# Patient Record
Sex: Female | Born: 1940 | Race: White | Hispanic: No | Marital: Married | Smoking: Never smoker
Health system: Southern US, Community
[De-identification: ages and names within clinical notes are randomized; demographics above are authoritative.]

## PROBLEM LIST (undated history)

## (undated) DIAGNOSIS — H9190 Unspecified hearing loss, unspecified ear: Secondary | ICD-10-CM

## (undated) DIAGNOSIS — I10 Essential (primary) hypertension: Secondary | ICD-10-CM

## (undated) DIAGNOSIS — Z87442 Personal history of urinary calculi: Secondary | ICD-10-CM

## (undated) DIAGNOSIS — E785 Hyperlipidemia, unspecified: Secondary | ICD-10-CM

## (undated) DIAGNOSIS — E039 Hypothyroidism, unspecified: Secondary | ICD-10-CM

## (undated) DIAGNOSIS — M1990 Unspecified osteoarthritis, unspecified site: Secondary | ICD-10-CM

## (undated) DIAGNOSIS — E119 Type 2 diabetes mellitus without complications: Secondary | ICD-10-CM

---

## 2001-03-31 ENCOUNTER — Encounter: Payer: Self-pay | Admitting: Internal Medicine

## 2001-03-31 ENCOUNTER — Ambulatory Visit (HOSPITAL_COMMUNITY): Admission: RE | Admit: 2001-03-31 | Discharge: 2001-03-31 | Payer: Self-pay | Admitting: Internal Medicine

## 2001-04-24 ENCOUNTER — Other Ambulatory Visit: Admission: RE | Admit: 2001-04-24 | Discharge: 2001-04-24 | Payer: Self-pay | Admitting: Internal Medicine

## 2001-04-30 ENCOUNTER — Encounter: Payer: Self-pay | Admitting: Internal Medicine

## 2001-04-30 ENCOUNTER — Ambulatory Visit (HOSPITAL_COMMUNITY): Admission: RE | Admit: 2001-04-30 | Discharge: 2001-04-30 | Payer: Self-pay | Admitting: Internal Medicine

## 2001-11-25 ENCOUNTER — Encounter: Payer: Self-pay | Admitting: Emergency Medicine

## 2001-11-25 ENCOUNTER — Emergency Department (HOSPITAL_COMMUNITY): Admission: EM | Admit: 2001-11-25 | Discharge: 2001-11-25 | Payer: Self-pay | Admitting: Emergency Medicine

## 2002-04-28 ENCOUNTER — Encounter: Payer: Self-pay | Admitting: Internal Medicine

## 2002-04-28 ENCOUNTER — Ambulatory Visit (HOSPITAL_COMMUNITY): Admission: RE | Admit: 2002-04-28 | Discharge: 2002-04-28 | Payer: Self-pay | Admitting: Internal Medicine

## 2003-05-24 ENCOUNTER — Ambulatory Visit (HOSPITAL_COMMUNITY): Admission: RE | Admit: 2003-05-24 | Discharge: 2003-05-24 | Payer: Self-pay | Admitting: Internal Medicine

## 2003-08-02 ENCOUNTER — Ambulatory Visit (HOSPITAL_COMMUNITY): Admission: RE | Admit: 2003-08-02 | Discharge: 2003-08-02 | Payer: Self-pay | Admitting: Internal Medicine

## 2004-06-12 ENCOUNTER — Ambulatory Visit (HOSPITAL_COMMUNITY): Admission: RE | Admit: 2004-06-12 | Discharge: 2004-06-12 | Payer: Self-pay | Admitting: Internal Medicine

## 2005-03-19 HISTORY — PX: COLONOSCOPY: SHX174

## 2005-04-03 ENCOUNTER — Ambulatory Visit: Payer: Self-pay | Admitting: Internal Medicine

## 2005-04-03 ENCOUNTER — Ambulatory Visit (HOSPITAL_COMMUNITY): Admission: RE | Admit: 2005-04-03 | Discharge: 2005-04-03 | Payer: Self-pay | Admitting: Internal Medicine

## 2005-06-19 ENCOUNTER — Ambulatory Visit (HOSPITAL_COMMUNITY): Admission: RE | Admit: 2005-06-19 | Discharge: 2005-06-19 | Payer: Self-pay | Admitting: Family Medicine

## 2006-06-24 ENCOUNTER — Ambulatory Visit (HOSPITAL_COMMUNITY): Admission: RE | Admit: 2006-06-24 | Discharge: 2006-06-24 | Payer: Self-pay | Admitting: Family Medicine

## 2007-07-08 ENCOUNTER — Ambulatory Visit (HOSPITAL_COMMUNITY): Admission: RE | Admit: 2007-07-08 | Discharge: 2007-07-08 | Payer: Self-pay | Admitting: Family Medicine

## 2007-12-04 ENCOUNTER — Ambulatory Visit (HOSPITAL_COMMUNITY): Admission: RE | Admit: 2007-12-04 | Discharge: 2007-12-04 | Payer: Self-pay | Admitting: Ophthalmology

## 2008-01-15 ENCOUNTER — Ambulatory Visit (HOSPITAL_COMMUNITY): Admission: RE | Admit: 2008-01-15 | Discharge: 2008-01-15 | Payer: Self-pay | Admitting: Ophthalmology

## 2008-07-12 ENCOUNTER — Ambulatory Visit (HOSPITAL_COMMUNITY): Admission: RE | Admit: 2008-07-12 | Discharge: 2008-07-12 | Payer: Self-pay | Admitting: Family Medicine

## 2009-07-19 ENCOUNTER — Ambulatory Visit (HOSPITAL_COMMUNITY): Admission: RE | Admit: 2009-07-19 | Discharge: 2009-07-19 | Payer: Self-pay | Admitting: Family Medicine

## 2010-08-04 NOTE — Op Note (Signed)
Lisa Beck, Lisa Beck               ACCOUNT NO.:  [id number]   MEDICAL RECORD NO.:  [id number]          PATIENT TYPE:  AMB   LOCATION:  DAY                           FACILITY:  [facility]   PHYSICIAN:  Lionel December, M.D.    DATE OF BIRTH:  [birth year]   DATE OF PROCEDURE:  04/03/2005  DATE OF DISCHARGE:                                 OPERATIVE REPORT   PROCEDURE:  Colonoscopy.   INDICATIONS:  [name] is a 70 year old Caucasian female who is undergoing  screening colonoscopy. Family history is negative for colorectal carcinoma.  Procedure and risks were reviewed with the patient and informed consent was  obtained.   PREOPERATIVE MEDICATIONS:  Demerol 25 mg IV, Versed 5 mg IV in divided  doses.   FINDINGS:  Procedure performed in endoscopy suite. The patient's vital signs  and O2 saturations were monitored during the procedure and remained stable.  The patient was placed in the left lateral position and rectal examination  performed. No abnormality noted on external or digital exam. Olympus video  scope was placed in the rectum and advanced into sigmoid colon and beyond.  Preparation was satisfactory. Scattered diverticula were noted at sigmoid  colon. Scope was advanced to the cecum which was identified by appendiceal  orifice and ileocecal valve. Pictures taken for the record. As the scope was  withdrawn, colonic mucosa was examined for the second time, and there were  no polyps and/or masses noted. The rectal mucosa similarly was normal. Scope  was retroflexed to examine anorectal junction which was unremarkable. The  scope was straightened and withdrawn. The patient tolerated the procedure  well.   FINAL DIAGNOSIS:  Few scattered diverticula at sigmoid colon. Otherwise  normal colonoscopy.   RECOMMENDATIONS:  1.  Yearly Hemoccults.  2.  High-fiber diet. If she does not take high-fiber diet, she can add fiber      supplement 3 to 4 g per day.      Lionel December, M.D.  Electronically Signed     NR/MEDQ  D:  04/03/2005  T:  04/03/2005  Job:  [id number]   cc:   Melvyn Novas, MD  Fax: [phone]

## 2010-08-10 ENCOUNTER — Other Ambulatory Visit (HOSPITAL_COMMUNITY): Payer: Self-pay | Admitting: Family Medicine

## 2010-08-10 DIAGNOSIS — Z139 Encounter for screening, unspecified: Secondary | ICD-10-CM

## 2010-08-17 ENCOUNTER — Ambulatory Visit (HOSPITAL_COMMUNITY)
Admission: RE | Admit: 2010-08-17 | Discharge: 2010-08-17 | Disposition: A | Discharge status: Home / Self Care | Payer: Medicare Other | Source: Ambulatory Visit | Attending: Family Medicine | Admitting: Family Medicine

## 2010-08-17 DIAGNOSIS — Z139 Encounter for screening, unspecified: Secondary | ICD-10-CM

## 2010-08-17 DIAGNOSIS — Z1231 Encounter for screening mammogram for malignant neoplasm of breast: Secondary | ICD-10-CM | POA: Insufficient documentation

## 2010-12-18 LAB — GLUCOSE, CAPILLARY
Glucose-Capillary: 123 — ABNORMAL HIGH
Glucose-Capillary: 125 — ABNORMAL HIGH

## 2010-12-20 LAB — BASIC METABOLIC PANEL
BUN: 14
CO2: 29
Chloride: 103
Creatinine, Ser: 0.71

## 2010-12-20 LAB — HEMOGLOBIN AND HEMATOCRIT, BLOOD: Hemoglobin: 13.3

## 2011-08-23 ENCOUNTER — Other Ambulatory Visit (HOSPITAL_COMMUNITY): Payer: Self-pay | Admitting: Family Medicine

## 2011-08-23 DIAGNOSIS — Z139 Encounter for screening, unspecified: Secondary | ICD-10-CM

## 2011-08-27 ENCOUNTER — Ambulatory Visit (HOSPITAL_COMMUNITY)
Admission: RE | Admit: 2011-08-27 | Discharge: 2011-08-27 | Disposition: A | Discharge status: Home / Self Care | Payer: Medicare Other | Source: Ambulatory Visit | Attending: Family Medicine | Admitting: Family Medicine

## 2011-08-27 DIAGNOSIS — Z139 Encounter for screening, unspecified: Secondary | ICD-10-CM

## 2011-08-27 DIAGNOSIS — Z1231 Encounter for screening mammogram for malignant neoplasm of breast: Secondary | ICD-10-CM | POA: Insufficient documentation

## 2011-09-03 ENCOUNTER — Other Ambulatory Visit: Payer: Self-pay | Admitting: Family Medicine

## 2011-09-03 DIAGNOSIS — R928 Other abnormal and inconclusive findings on diagnostic imaging of breast: Secondary | ICD-10-CM

## 2011-09-12 ENCOUNTER — Ambulatory Visit (HOSPITAL_COMMUNITY)
Admission: RE | Admit: 2011-09-12 | Discharge: 2011-09-12 | Disposition: A | Discharge status: Home / Self Care | Payer: Medicare Other | Source: Ambulatory Visit | Attending: Family Medicine | Admitting: Family Medicine

## 2011-09-12 DIAGNOSIS — R928 Other abnormal and inconclusive findings on diagnostic imaging of breast: Secondary | ICD-10-CM | POA: Insufficient documentation

## 2012-09-08 ENCOUNTER — Other Ambulatory Visit (HOSPITAL_COMMUNITY): Payer: Self-pay | Admitting: Family Medicine

## 2012-09-08 DIAGNOSIS — Z139 Encounter for screening, unspecified: Secondary | ICD-10-CM

## 2012-09-15 ENCOUNTER — Ambulatory Visit (HOSPITAL_COMMUNITY)
Admission: RE | Admit: 2012-09-15 | Discharge: 2012-09-15 | Disposition: A | Discharge status: Home / Self Care | Payer: Medicare Other | Source: Ambulatory Visit | Attending: Family Medicine | Admitting: Family Medicine

## 2012-09-15 DIAGNOSIS — Z1231 Encounter for screening mammogram for malignant neoplasm of breast: Secondary | ICD-10-CM | POA: Insufficient documentation

## 2012-09-15 DIAGNOSIS — Z139 Encounter for screening, unspecified: Secondary | ICD-10-CM

## 2013-09-25 ENCOUNTER — Other Ambulatory Visit (HOSPITAL_COMMUNITY): Payer: Self-pay | Admitting: Family Medicine

## 2013-09-25 DIAGNOSIS — Z139 Encounter for screening, unspecified: Secondary | ICD-10-CM

## 2013-09-25 DIAGNOSIS — Z1231 Encounter for screening mammogram for malignant neoplasm of breast: Secondary | ICD-10-CM

## 2013-09-30 ENCOUNTER — Ambulatory Visit (HOSPITAL_COMMUNITY)
Admission: RE | Admit: 2013-09-30 | Discharge: 2013-09-30 | Disposition: A | Discharge status: Home / Self Care | Payer: Medicare Other | Source: Ambulatory Visit | Attending: Family Medicine | Admitting: Family Medicine

## 2013-09-30 DIAGNOSIS — Z1231 Encounter for screening mammogram for malignant neoplasm of breast: Secondary | ICD-10-CM

## 2014-06-15 ENCOUNTER — Encounter (HOSPITAL_COMMUNITY): Payer: Self-pay

## 2014-06-15 ENCOUNTER — Inpatient Hospital Stay (HOSPITAL_COMMUNITY)
Admission: EM | Admit: 2014-06-15 | Discharge: 2014-06-17 | DRG: 313 | Disposition: A | Discharge status: Home Health | Payer: Medicare Other | Attending: Family Medicine | Admitting: Family Medicine

## 2014-06-15 ENCOUNTER — Emergency Department (HOSPITAL_COMMUNITY): Payer: Medicare Other

## 2014-06-15 DIAGNOSIS — I10 Essential (primary) hypertension: Secondary | ICD-10-CM | POA: Diagnosis present

## 2014-06-15 DIAGNOSIS — R0789 Other chest pain: Principal | ICD-10-CM | POA: Diagnosis present

## 2014-06-15 DIAGNOSIS — Z7982 Long term (current) use of aspirin: Secondary | ICD-10-CM | POA: Diagnosis not present

## 2014-06-15 DIAGNOSIS — Z794 Long term (current) use of insulin: Secondary | ICD-10-CM | POA: Diagnosis not present

## 2014-06-15 DIAGNOSIS — E785 Hyperlipidemia, unspecified: Secondary | ICD-10-CM | POA: Diagnosis present

## 2014-06-15 DIAGNOSIS — E119 Type 2 diabetes mellitus without complications: Secondary | ICD-10-CM | POA: Diagnosis present

## 2014-06-15 DIAGNOSIS — R079 Chest pain, unspecified: Secondary | ICD-10-CM | POA: Diagnosis present

## 2014-06-15 DIAGNOSIS — E039 Hypothyroidism, unspecified: Secondary | ICD-10-CM | POA: Diagnosis present

## 2014-06-15 HISTORY — DX: Hypothyroidism, unspecified: E03.9

## 2014-06-15 HISTORY — DX: Hyperlipidemia, unspecified: E78.5

## 2014-06-15 HISTORY — DX: Essential (primary) hypertension: I10

## 2014-06-15 HISTORY — DX: Type 2 diabetes mellitus without complications: E11.9

## 2014-06-15 LAB — TROPONIN I

## 2014-06-15 LAB — BASIC METABOLIC PANEL
ANION GAP: 9 (ref 5–15)
BUN: 17 mg/dL (ref 6–23)
CHLORIDE: 102 mmol/L (ref 96–112)
CO2: 23 mmol/L (ref 19–32)
Calcium: 9.9 mg/dL (ref 8.4–10.5)
Creatinine, Ser: 0.88 mg/dL (ref 0.50–1.10)
GFR, EST AFRICAN AMERICAN: 73 mL/min — AB (ref >90)
GFR, EST NON AFRICAN AMERICAN: 63 mL/min — AB (ref >90)
Glucose, Bld: 246 mg/dL — ABNORMAL HIGH (ref 70–99)
POTASSIUM: 3.8 mmol/L (ref 3.5–5.1)
SODIUM: 134 mmol/L — AB (ref 135–145)

## 2014-06-15 LAB — HEPATIC FUNCTION PANEL
ALBUMIN: 4.1 g/dL (ref 3.5–5.2)
ALK PHOS: 90 U/L (ref 39–117)
ALT: 25 U/L (ref 0–35)
AST: 27 U/L (ref 0–37)
Bilirubin, Direct: <0.1 mg/dL (ref 0.0–0.5)
Total Bilirubin: 0.3 mg/dL (ref 0.3–1.2)
Total Protein: 7.2 g/dL (ref 6.0–8.3)

## 2014-06-15 LAB — CBC
HCT: 37.5 % (ref 36.0–46.0)
HEMOGLOBIN: 12.5 g/dL (ref 12.0–15.0)
MCH: 28.9 pg (ref 26.0–34.0)
MCHC: 33.3 g/dL (ref 30.0–36.0)
MCV: 86.6 fL (ref 78.0–100.0)
PLATELETS: 285 10*3/uL (ref 150–400)
RBC: 4.33 MIL/uL (ref 3.87–5.11)
RDW: 13.3 % (ref 11.5–15.5)
WBC: 8.1 10*3/uL (ref 4.0–10.5)

## 2014-06-15 LAB — LIPASE, BLOOD: Lipase: 31 U/L (ref 11–59)

## 2014-06-15 MED ORDER — GI COCKTAIL ~~LOC~~
30.0000 mL | Freq: Once | ORAL | Status: AC
  Administered 2014-06-15: 30 mL via ORAL

## 2014-06-15 MED ORDER — GI COCKTAIL ~~LOC~~
ORAL | Status: AC
  Filled 2014-06-15: qty 30

## 2014-06-15 MED ORDER — NITROGLYCERIN 0.4 MG SL SUBL
SUBLINGUAL_TABLET | SUBLINGUAL | Status: AC
  Administered 2014-06-15: 0.4 mg
  Filled 2014-06-15: qty 1

## 2014-06-15 NOTE — ED Provider Notes (Signed)
CSN: [id number]     Arrival date & time 06/15/14  1940 History   This chart was scribed for Benjiman CoreNathan Zuly Belkin, MD by Evon Slackerrance Branch, ED Scribe. This patient was seen in room [facility] and the patient's care was started at 7:53 PM.     Chief Complaint  Patient presents with  . Chest Pain    Patient is a 74 y.o. female presenting with chest pain. The history is provided by the patient. No language interpreter was used.  Chest Pain Associated symptoms: back pain   Associated symptoms: no shortness of breath    HPI Comments: Lisa Beck is a 74 y.o. female with PMHX of HTN and who presents to the Emergency Department complaining of intermittent central chest pain onset 2 days prior. Pt does report having some back pain. Pt restates that the pain has become more constant today at 4 pm. Pt describes the pain as a pressure on her chest. Pt doesn't report any worsening factors. Pt doesn't report any alleviating factors. Pt doesn't report any medications PTA. Pt denies SOB or other related symptoms.     Past Medical History  Diagnosis Date  . Hypertension   . Diabetes mellitus without complication   . Thyroid disease    History reviewed. No pertinent past surgical history. No family history on file. History  Substance Use Topics  . Smoking status: Never Smoker   . Smokeless tobacco: Never Used  . Alcohol Use: No   OB History    No data available     Review of Systems  Respiratory: Negative for shortness of breath.   Cardiovascular: Positive for chest pain.  Musculoskeletal: Positive for back pain.  All other systems reviewed and are negative.    Allergies  Review of patient's allergies indicates no known allergies.  Home Medications   Prior to Admission medications   Medication Sig Start Date End Date Taking? Authorizing Provider  amLODipine (NORVASC) 5 MG tablet Take 5 mg by mouth daily.   Yes Historical Provider, MD  aspirin EC 81 MG tablet Take 81 mg by mouth every  evening.   Yes Historical Provider, MD  atorvastatin (LIPITOR) 40 MG tablet Take 40 mg by mouth every evening.   Yes Historical Provider, MD  insulin glargine (LANTUS) 100 UNIT/ML injection Inject 32 Units into the skin at bedtime.   Yes Historical Provider, MD  levothyroxine (SYNTHROID, LEVOTHROID) 100 MCG tablet Take 100 mcg by mouth daily before breakfast.   Yes Historical Provider, MD  metFORMIN (GLUCOPHAGE) 500 MG tablet Take 500 mg by mouth 2 (two) times daily.   Yes Historical Provider, MD  metoprolol succinate (TOPROL-XL) 50 MG 24 hr tablet Take 50 mg by mouth daily. Take with or immediately following a meal.   Yes Historical Provider, MD  telmisartan-hydrochlorothiazide (MICARDIS HCT) 40-12.5 MG per tablet Take 1 tablet by mouth daily.   Yes Historical Provider, MD   BP 136/64 mmHg  Pulse 63  Temp(Src) 97.9 F (36.6 C) (Oral)  Resp 18  Ht 5\' 4"  (1.626 m)  Wt 139 lb (63.05 kg)  BMI 23.85 kg/m2  SpO2 100%   Physical Exam  Constitutional: She is oriented to person, place, and time. She appears well-developed and well-nourished. No distress.  HENT:  Head: Normocephalic and atraumatic.  Eyes: Conjunctivae and EOM are normal.  Neck: Neck supple. No tracheal deviation present.  Cardiovascular: Normal rate and regular rhythm.   No murmur heard. Pulmonary/Chest: Effort normal. No respiratory distress.  Abdominal: There is tenderness  in the epigastric area. There is no rebound and no guarding.  Musculoskeletal: Normal range of motion.  Neurological: She is alert and oriented to person, place, and time.  Skin: Skin is warm and dry.  Psychiatric: She has a normal mood and affect. Her behavior is normal.  Nursing note and vitals reviewed.   ED Course  Procedures (including critical care time) Labs Review Labs Reviewed  BASIC METABOLIC PANEL - Abnormal; Notable for the following:    Sodium 134 (*)    Glucose, Bld 246 (*)    GFR calc non Af Amer 63 (*)    GFR calc Af Amer 73 (*)     All other components within normal limits  CBC  TROPONIN I  HEPATIC FUNCTION PANEL  LIPASE, BLOOD  URINALYSIS, ROUTINE W REFLEX MICROSCOPIC  HEMOGLOBIN A1C  COMPREHENSIVE METABOLIC PANEL  CBC WITH DIFFERENTIAL/PLATELET  TSH  TROPONIN I  TROPONIN I  TROPONIN I  BRAIN NATRIURETIC PEPTIDE  LIPID PANEL    Imaging Review Dg Chest Port 1 View  06/15/2014   CLINICAL DATA:  Chest pain, shortness of breath, dizziness starting 4 p.m.  EXAM: PORTABLE CHEST - 1 VIEW  COMPARISON:  None.  FINDINGS: Cardiomediastinal silhouette is unremarkable. No acute infiltrate or pleural effusion. No pulmonary edema. Bony thorax is unremarkable.  IMPRESSION: No active disease.   Electronically Signed   By: Natasha Mead M.D.   On: 06/15/2014 20:18     EKG Interpretation None      Date: 06/15/2014  Rate: 89  Rhythm: normal sinus rhythm  QRS Axis: normal  Intervals: normal  ST/T Wave abnormalities: Nonspecific ST changes with some possible depression and V4 5. No ST elevation.  Conduction Disutrbances: none     MDM   Final diagnoses:  Chest pain, unspecified chest pain type   Patient with chest pain. Does have epigastric tenderness later went to the right side or abdomen. Troponin is negative. Has had pain for last 2 days. Will admit to internal medicine.    I personally performed the services described in this documentation, which was scribed in my presence. The recorded information has been reviewed and is accurate.       Benjiman Core, MD 06/16/14 0030

## 2014-06-15 NOTE — ED Notes (Signed)
Patient states central chest pressure that started today at 1600. Sob and dizziness

## 2014-06-16 ENCOUNTER — Inpatient Hospital Stay (HOSPITAL_COMMUNITY): Payer: Medicare Other

## 2014-06-16 ENCOUNTER — Encounter (HOSPITAL_COMMUNITY): Payer: Self-pay

## 2014-06-16 DIAGNOSIS — R079 Chest pain, unspecified: Secondary | ICD-10-CM | POA: Diagnosis not present

## 2014-06-16 DIAGNOSIS — R0789 Other chest pain: Principal | ICD-10-CM

## 2014-06-16 LAB — CBC WITH DIFFERENTIAL/PLATELET
BASOS ABS: 0.1 10*3/uL (ref 0.0–0.1)
Basophils Relative: 1 % (ref 0–1)
Eosinophils Absolute: 0.1 10*3/uL (ref 0.0–0.7)
Eosinophils Relative: 2 % (ref 0–5)
HCT: 36.3 % (ref 36.0–46.0)
Hemoglobin: 11.8 g/dL — ABNORMAL LOW (ref 12.0–15.0)
Lymphocytes Relative: 39 % (ref 12–46)
Lymphs Abs: 2.3 10*3/uL (ref 0.7–4.0)
MCH: 27.8 pg (ref 26.0–34.0)
MCHC: 32.5 g/dL (ref 30.0–36.0)
MCV: 85.6 fL (ref 78.0–100.0)
MONOS PCT: 7 % (ref 3–12)
Monocytes Absolute: 0.4 10*3/uL (ref 0.1–1.0)
NEUTROS ABS: 3 10*3/uL (ref 1.7–7.7)
Neutrophils Relative %: 51 % (ref 43–77)
Platelets: 251 10*3/uL (ref 150–400)
RBC: 4.24 MIL/uL (ref 3.87–5.11)
RDW: 13.3 % (ref 11.5–15.5)
WBC: 5.9 10*3/uL (ref 4.0–10.5)

## 2014-06-16 LAB — URINALYSIS, ROUTINE W REFLEX MICROSCOPIC
Bilirubin Urine: NEGATIVE
Glucose, UA: 500 mg/dL — AB
Hgb urine dipstick: NEGATIVE
Ketones, ur: NEGATIVE mg/dL
Nitrite: NEGATIVE
Protein, ur: NEGATIVE mg/dL
Specific Gravity, Urine: 1.02 (ref 1.005–1.030)
Urobilinogen, UA: 0.2 mg/dL (ref 0.0–1.0)
pH: 5.5 (ref 5.0–8.0)

## 2014-06-16 LAB — CBG MONITORING, ED: Glucose-Capillary: 106 mg/dL — ABNORMAL HIGH (ref 70–99)

## 2014-06-16 LAB — COMPREHENSIVE METABOLIC PANEL
ALBUMIN: 3.6 g/dL (ref 3.5–5.2)
ALK PHOS: 77 U/L (ref 39–117)
ALT: 21 U/L (ref 0–35)
ANION GAP: 6 (ref 5–15)
AST: 23 U/L (ref 0–37)
BILIRUBIN TOTAL: 0.5 mg/dL (ref 0.3–1.2)
BUN: 12 mg/dL (ref 6–23)
CHLORIDE: 107 mmol/L (ref 96–112)
CO2: 25 mmol/L (ref 19–32)
Calcium: 9.5 mg/dL (ref 8.4–10.5)
Creatinine, Ser: 0.63 mg/dL (ref 0.50–1.10)
GFR calc Af Amer: >90 mL/min (ref >90)
GFR calc non Af Amer: 86 mL/min — ABNORMAL LOW (ref >90)
Glucose, Bld: 89 mg/dL (ref 70–99)
Potassium: 3.7 mmol/L (ref 3.5–5.1)
SODIUM: 138 mmol/L (ref 135–145)
TOTAL PROTEIN: 6.3 g/dL (ref 6.0–8.3)

## 2014-06-16 LAB — URINE MICROSCOPIC-ADD ON

## 2014-06-16 LAB — LIPID PANEL
Cholesterol: 155 mg/dL (ref 0–200)
HDL: 63 mg/dL (ref >39)
LDL Cholesterol: 31 mg/dL (ref 0–99)
Total CHOL/HDL Ratio: 2.5 RATIO
Triglycerides: 304 mg/dL — ABNORMAL HIGH (ref <150)
VLDL: 61 mg/dL — ABNORMAL HIGH (ref 0–40)

## 2014-06-16 LAB — BRAIN NATRIURETIC PEPTIDE: B NATRIURETIC PEPTIDE 5: 21 pg/mL (ref 0.0–100.0)

## 2014-06-16 LAB — TROPONIN I

## 2014-06-16 LAB — TSH: TSH: 1.731 u[IU]/mL (ref 0.350–4.500)

## 2014-06-16 LAB — GLUCOSE, CAPILLARY
GLUCOSE-CAPILLARY: 109 mg/dL — AB (ref 70–99)
Glucose-Capillary: 112 mg/dL — ABNORMAL HIGH (ref 70–99)
Glucose-Capillary: 126 mg/dL — ABNORMAL HIGH (ref 70–99)
Glucose-Capillary: 131 mg/dL — ABNORMAL HIGH (ref 70–99)
Glucose-Capillary: 72 mg/dL (ref 70–99)
Glucose-Capillary: 80 mg/dL (ref 70–99)

## 2014-06-16 MED ORDER — ASPIRIN EC 325 MG PO TBEC
325.0000 mg | DELAYED_RELEASE_TABLET | Freq: Every evening | ORAL | Status: DC
  Administered 2014-06-16: 325 mg via ORAL
  Filled 2014-06-16: qty 1

## 2014-06-16 MED ORDER — TELMISARTAN-HCTZ 40-12.5 MG PO TABS: 1.0000 | ORAL_TABLET | Freq: Every day | ORAL | Status: DC

## 2014-06-16 MED ORDER — INSULIN ASPART 100 UNIT/ML ~~LOC~~ SOLN
0.0000 [IU] | SUBCUTANEOUS | Status: DC
  Administered 2014-06-16: 1 [IU] via SUBCUTANEOUS
  Administered 2014-06-17: 2 [IU] via SUBCUTANEOUS
  Administered 2014-06-17: 1 [IU] via SUBCUTANEOUS

## 2014-06-16 MED ORDER — ONDANSETRON HCL 4 MG/2ML IJ SOLN: 4.0000 mg | Freq: Four times a day (QID) | INTRAMUSCULAR | Status: DC | PRN

## 2014-06-16 MED ORDER — SODIUM CHLORIDE 0.9 % IJ SOLN: 3.0000 mL | INTRAMUSCULAR | Status: DC | PRN

## 2014-06-16 MED ORDER — MORPHINE SULFATE 2 MG/ML IJ SOLN: 1.0000 mg | INTRAMUSCULAR | Status: DC | PRN

## 2014-06-16 MED ORDER — SODIUM CHLORIDE 0.9 % IJ SOLN
3.0000 mL | Freq: Two times a day (BID) | INTRAMUSCULAR | Status: DC
  Administered 2014-06-16 – 2014-06-17 (×3): 3 mL via INTRAVENOUS

## 2014-06-16 MED ORDER — HEPARIN SODIUM (PORCINE) 5000 UNIT/ML IJ SOLN
5000.0000 [IU] | Freq: Three times a day (TID) | INTRAMUSCULAR | Status: DC
  Administered 2014-06-16 – 2014-06-17 (×4): 5000 [IU] via SUBCUTANEOUS
  Filled 2014-06-16 (×3): qty 1

## 2014-06-16 MED ORDER — IRBESARTAN 150 MG PO TABS
150.0000 mg | ORAL_TABLET | Freq: Every day | ORAL | Status: DC
  Administered 2014-06-16 – 2014-06-17 (×2): 150 mg via ORAL
  Filled 2014-06-16 (×2): qty 1

## 2014-06-16 MED ORDER — SODIUM CHLORIDE 0.9 % IV SOLN: 250.0000 mL | INTRAVENOUS | Status: DC | PRN

## 2014-06-16 MED ORDER — LEVOTHYROXINE SODIUM 100 MCG PO TABS
100.0000 ug | ORAL_TABLET | Freq: Every day | ORAL | Status: DC
  Administered 2014-06-17: 100 ug via ORAL
  Filled 2014-06-16 (×2): qty 1

## 2014-06-16 MED ORDER — INSULIN GLARGINE 100 UNIT/ML ~~LOC~~ SOLN
32.0000 [IU] | Freq: Every day | SUBCUTANEOUS | Status: DC
  Administered 2014-06-16 (×2): 32 [IU] via SUBCUTANEOUS
  Filled 2014-06-16 (×2): qty 0.32

## 2014-06-16 MED ORDER — ONDANSETRON HCL 4 MG PO TABS: 4.0000 mg | ORAL_TABLET | Freq: Four times a day (QID) | ORAL | Status: DC | PRN

## 2014-06-16 MED ORDER — METOPROLOL SUCCINATE ER 50 MG PO TB24
50.0000 mg | ORAL_TABLET | Freq: Every day | ORAL | Status: DC
  Administered 2014-06-16 – 2014-06-17 (×2): 50 mg via ORAL
  Filled 2014-06-16 (×2): qty 1

## 2014-06-16 MED ORDER — ATORVASTATIN CALCIUM 40 MG PO TABS
40.0000 mg | ORAL_TABLET | Freq: Every evening | ORAL | Status: DC
  Administered 2014-06-16: 40 mg via ORAL
  Filled 2014-06-16: qty 1

## 2014-06-16 MED ORDER — AMLODIPINE BESYLATE 5 MG PO TABS
5.0000 mg | ORAL_TABLET | Freq: Every day | ORAL | Status: DC
  Administered 2014-06-16 – 2014-06-17 (×2): 5 mg via ORAL
  Filled 2014-06-16 (×2): qty 1

## 2014-06-16 MED ORDER — HYDROCHLOROTHIAZIDE 12.5 MG PO CAPS
12.5000 mg | ORAL_CAPSULE | Freq: Every day | ORAL | Status: DC
  Administered 2014-06-16 – 2014-06-17 (×2): 12.5 mg via ORAL
  Filled 2014-06-16 (×2): qty 1

## 2014-06-16 MED ORDER — NITROGLYCERIN 0.4 MG/HR TD PT24
0.4000 mg | MEDICATED_PATCH | Freq: Every day | TRANSDERMAL | Status: AC
Start: 2014-06-16 — End: 2014-06-17
  Administered 2014-06-16: 0.4 mg via TRANSDERMAL
  Filled 2014-06-16 (×3): qty 1

## 2014-06-16 NOTE — Consult Note (Signed)
Cardiology Consultation Note  Patient ID: Lisa Beck, MRN: [id number], DOB/AGE: 75-Apr-1942 75 y.o. Admit date: 06/15/2014   Date of Consult: 06/16/2014 Primary Physician: Isabella Stalling, MD Primary Cardiologist: New to Dr. Wyline Mood  Chief Complaint: chest pain Reason for Consultation: chest pain  HPI: 74 y/o F with history of HTN, IDDM (diabetic for 2 years per patient), HLD, hypothyroidism, family history of CAD but no personal history of CAD who presented to [facility] with chest pain yesterday with chest pain. She says she has been having this on and off for several days, lasting minutes, in the center of her chest described as a pressure, without any particular pattern. It has not occurred with exertion but seems to come on at random. It goes away spontaneously. Yesterday around 4pm she developed substernal chest pressure while fixing dinner. She took aspirin and got ready to go to the ER to be checked out. She was given a GI cocktail with relief, as well as a subsequent NTG with relief (both around 8pm). Chest pain had been constant until this point, about 4 hours' worth. Pain was not made worse by anything including exertion, palpation or inspiration. She denied any associated dyspnea, palpitations, diaphoresis, nausea, vomiting, and has not had any recent LEE, orthopnea, PND, syncope, bedrest, surgery, travel, melena, hematemesis or BRBPR. She is quite active - "I do everything for myself" - and exercises regularly with walking several miles several times a week. Earlier yesterday, she had walked 2 miles without any chest pain or dyspnea. She thinks she had a remote stress test, details unknown. Father had MI sometime in middle life, age unknown. 3 of 5 brothers have died of MIs between ages of 24s-70s.  SBP a little elevated when she came into the hospital at 160s, but currently back down to normal. Labs show normal BNP, tropnonin neg x1, LDL 31, trig 305, CMET wnl. Hgb 11-12, TSH wnl. CXR NAD. VSS  - not tachycardic, tachypnic or hypoxic. Floor is unable to locate initial EKG - admit H/P states "EKG w/ ? t wave flattening per EDP (formal EKG not present)." 12-lead EKG obtained this AM shows NSR 72bpm with nonspecific changes including TWI III, V3 and nonspecific TW change in avF. No prior to compare to. 2D echo pending.   Past Medical History  Diagnosis Date  . Hypertension   . Diabetes mellitus without complication   . Hypothyroidism   . Hyperlipidemia       Most Recent Cardiac Studies: None   Surgical History: History reviewed. No pertinent past surgical history.   Home Meds: Prior to Admission medications   Medication Sig Start Date End Date Taking? Authorizing Provider  amLODipine (NORVASC) 5 MG tablet Take 5 mg by mouth daily.   Yes Historical Provider, MD  aspirin EC 81 MG tablet Take 81 mg by mouth every evening.   Yes Historical Provider, MD  atorvastatin (LIPITOR) 40 MG tablet Take 40 mg by mouth every evening.   Yes Historical Provider, MD  insulin glargine (LANTUS) 100 UNIT/ML injection Inject 32 Units into the skin at bedtime.   Yes Historical Provider, MD  levothyroxine (SYNTHROID, LEVOTHROID) 100 MCG tablet Take 100 mcg by mouth daily before breakfast.   Yes Historical Provider, MD  metFORMIN (GLUCOPHAGE) 500 MG tablet Take 500 mg by mouth 2 (two) times daily.   Yes Historical Provider, MD  metoprolol succinate (TOPROL-XL) 50 MG 24 hr tablet Take 50 mg by mouth daily. Take with or immediately following a meal.  Yes Historical Provider, MD  telmisartan-hydrochlorothiazide (MICARDIS HCT) 40-12.5 MG per tablet Take 1 tablet by mouth daily.   Yes Historical Provider, MD    Inpatient Medications:  . amLODipine  5 mg Oral Daily  . aspirin EC  325 mg Oral QPM  . atorvastatin  40 mg Oral QPM  . heparin  5,000 Units Subcutaneous 3 times per day  . irbesartan  150 mg Oral Daily   And  . hydrochlorothiazide  12.5 mg Oral Daily  . insulin aspart  0-9 Units Subcutaneous 6  times per day  . insulin glargine  32 Units Subcutaneous QHS  . levothyroxine  100 mcg Oral QAC breakfast  . metoprolol succinate  50 mg Oral Daily  . nitroGLYCERIN  0.4 mg Transdermal Daily      Allergies: No Known Allergies  History   Social History  . Marital Status: Married    Spouse Name: N/A  . Number of Children: N/A  . Years of Education: N/A   Occupational History  . Not on file.   Social History Main Topics  . Smoking status: Never Smoker   . Smokeless tobacco: Never Used  . Alcohol Use: No  . Drug Use: No  . Sexual Activity: Not on file   Other Topics Concern  . Not on file   Social History Narrative     Family History  Problem Relation Age of Onset  . CAD Father     MI sometime in middle-life, age unknown  . CAD Brother     Pt has 3 brothers that died of MIs between ages 87s-70s  . CAD Brother   . CAD Brother      Review of Systems: All other systems reviewed and are otherwise negative except as noted above.  Labs:  Recent Labs  06/15/14 1950 06/16/14 0023 06/16/14 0717  TROPONINI <0.03 <0.03 <0.03   Lab Results  Component Value Date   WBC 5.9 06/16/2014   HGB 11.8* 06/16/2014   HCT 36.3 06/16/2014   MCV 85.6 06/16/2014   PLT 251 06/16/2014    Recent Labs Lab 06/16/14 0716  NA 138  K 3.7  CL 107  CO2 25  BUN 12  CREATININE 0.63  CALCIUM 9.5  PROT 6.3  BILITOT 0.5  ALKPHOS 77  ALT 21  AST 23  GLUCOSE 89   Lab Results  Component Value Date   CHOL 155 06/16/2014   HDL 63 06/16/2014   LDLCALC 31 06/16/2014   TRIG 304* 06/16/2014   Radiology/Studies:  Dg Chest Port 1 View  06/15/2014   CLINICAL DATA:  Chest pain, shortness of breath, dizziness starting 4 p.m.  EXAM: PORTABLE CHEST - 1 VIEW  COMPARISON:  None.  FINDINGS: Cardiomediastinal silhouette is unremarkable. No acute infiltrate or pleural effusion. No pulmonary edema. Bony thorax is unremarkable.  IMPRESSION: No active disease.   Electronically Signed   By: Natasha Mead M.D.   On: 06/15/2014 20:18    Wt Readings from Last 3 Encounters:  06/16/14 139 lb (63.05 kg)    EKG: NSR 72bpm with nonspecific changes including TWI III, V3 and nonspecific TW change in avF. No prior to compare to.   Physical Exam: Blood pressure 102/54, pulse 60, temperature 97.9 F (36.6 C), temperature source Oral, resp. rate 16, height  (1.626 m), weight 139 lb (63.05 kg), SpO2 97 %. General: Well developed, well nourished WF, in no acute distress. Head: Normocephalic, atraumatic, sclera non-icteric, no xanthomas, nares are without discharge.  Neck: Negative for carotid bruits. JVD not elevated. Lungs: Clear bilaterally to auscultation without wheezes, rales, or rhonchi. Breathing is unlabored. Heart: RRR with S1 S2. No murmurs, rubs, or gallops appreciated. Abdomen: Soft, non-tender, non-distended with normoactive bowel sounds. No hepatomegaly. No rebound/guarding. No obvious abdominal masses. Msk:  Strength and tone appear normal for age. Extremities: No clubbing or cyanosis. No edema.  Distal pedal pulses are 2+ and equal bilaterally. Neuro: Alert and oriented X 3. No facial asymmetry. No focal deficit. Moves all extremities spontaneously. Psych:  Responds to questions appropriately with a normal affect.    Assessment and Plan:   1. Prolonged chest pain with negative cardiac markers but positive cardiac risk factors 2. Hypertension, controlled 3. Hyperlipidemia, LDL controlled 4. Insulin-dependent diabetes mellitus 5. Family history of CAD  Chest pain is somewhat atypical. It occured randomly and was not worse with exertion. Her troponins are negative despite 3 hours of constant discomfort yesterday. She had walked 2 miles earlier that day without any limitation or symptoms. Discomfort was relieved with GI cocktail initially, then a SL NTG was given which also gave relief. She does have multiple cardiac risk factors including HTN, HLD, DM, and family history of CAD.  EKG with nonspecific changes of unknown chronicity. Will discuss plan with Dr. Wyline MoodBranch but anticipate stress test may be appropriate.  Signed, Ronie Spiesayna Dunn PA-C 06/16/2014, 10:11 AM   Patient seen and discussed with PA Dunn, I agree with her documentation. 74 yo female hx of HTN, DM2, HL admitted with chest pain. Episode yesterday evening while cooking. 5/10 pressure midchest with no other symptoms, no positional. Last a few hours, from notes improved in ER but unclear if due to GI cocktail or NG   BNP 21, TSH 1,2235m K 3.8, Cr 0.88, Hgb 12.5, Plt 285, trop neg x 3, LDL 31 CXR no acute process EKG SR, non-specific diffuse TWIs Echo pending  Chest pain symptoms are mixed for possible cardiac cause. She has multiple CAD risk factors. Will f/u echo, likely plan for exercise cardiolite tomorrow AM  Dominga FerryJ Elliemae Braman MD

## 2014-06-16 NOTE — Progress Notes (Signed)
2D echo reviewed. EF 65-70%, no RWMA. Will proceed with ETT-nuc in AM. Will d/c NTG paste in AM. NPO after midnight. Have written nurse care order to give 1/2 dose insulin tonight given NPO after midnight. Dayna Dunn PA-C

## 2014-06-16 NOTE — Progress Notes (Signed)
UR chart review completed.  

## 2014-06-16 NOTE — Progress Notes (Signed)
Appreciate cardiology note and expertise is with several risk factors and with atypical chest pain and no subsequent recurrence gallbladder ultrasound reveals no evidence of stones wall thickening or common bile duct dilatation patient appears anxious. Lisa Beck ZOX:[id number] DOB: 1940/04/04 DOA: 06/15/2014 PCP: Isabella Stalling, MD             Physical Exam: Blood pressure 142/66, pulse 62, temperature 97.9 F (36.6 C), temperature source Oral, resp. rate 16, height  (1.626 m), weight 139 lb (63.05 kg), SpO2 97 %. neck no JVD no carotid bruits no thyromegaly lungs clear to A&P no rales wheeze rhonchi heart regular rhythm no S3 or S4 no heaves rubs abdomen no right upper quadrant tenderness to guarding or rebound masses no megaly   Investigations:  No results found for this or any previous visit (from the past 240 hour(s)).   Basic Metabolic Panel:  Recent Labs  81/19/14 1950 06/16/14 0716  NA 134* 138  K 3.8 3.7  CL 102 107  CO2 23 25  GLUCOSE 246* 89  BUN 17 12  CREATININE 0.88 0.63  CALCIUM 9.9 9.5   Liver Function Tests:  Recent Labs  06/15/14 1950 06/16/14 0716  AST 27 23  ALT 25 21  ALKPHOS 90 77  BILITOT 0.3 0.5  PROT 7.2 6.3  ALBUMIN 4.1 3.6     CBC:  Recent Labs  06/15/14 1950 06/16/14 0716  WBC 8.1 5.9  NEUTROABS  --  3.0  HGB 12.5 11.8*  HCT 37.5 36.3  MCV 86.6 85.6  PLT 285 251    Dg Chest Port 1 View  06/15/2014   CLINICAL DATA:  Chest pain, shortness of breath, dizziness starting 4 p.m.  EXAM: PORTABLE CHEST - 1 VIEW  COMPARISON:  None.  FINDINGS: Cardiomediastinal silhouette is unremarkable. No acute infiltrate or pleural effusion. No pulmonary edema. Bony thorax is unremarkable.  IMPRESSION: No active disease.   Electronically Signed   By: Natasha Mead M.D.   On: 06/15/2014 20:18   US Abdomen Limited Ruq  06/16/2014   CLINICAL DATA:  One day history of lower chest pain  EXAM: US ABDOMEN LIMITED - RIGHT UPPER QUADRANT   COMPARISON:  CT abdomen and pelvis November 25, 2001  FINDINGS: Gallbladder:  No gallstones or wall thickening visualized. There is no pericholecystic fluid. No sonographic Murphy sign noted.  Common bile duct:  Diameter: 3 mm. There is no intrahepatic or extrahepatic biliary duct dilatation.  Liver:  No focal lesion identified. Liver echogenicity is mildly increased overall.  IMPRESSION: Increased liver echogenicity, a finding that is most likely due to hepatic steatosis. While no focal liver lesions are identified, it must be cautioned that the sensitivity of ultrasound for focal liver lesions is diminished in this circumstance. Study otherwise unremarkable.   Electronically Signed   By: Bretta Bang III M.D.   On: 06/16/2014 11:48      Medications:   Impression: Hypertension Hyper Lipidemia diabetes  Active Problems:   Chest pain     Plan: Observe patient possible functional evaluation prior to or after discharge per cardiology  Consultants: Cardiology    Procedures   Antibiotics:                   Code Status: Full   Family Communication:  Spoke with husband and patient at bedside  Disposition Plan possible functional violation prior to or after discharge determined by cardiology. We'll obtain hemoglobin A1c  Time spent: 30 minutes   LOS:  1 day   [name] M   06/16/2014, 1:39 PM

## 2014-06-16 NOTE — Progress Notes (Signed)
  Echocardiogram 2D Echocardiogram has been performed.  Stacey DrainWhite, Jentri Aye J 06/16/2014, 11:08 AM

## 2014-06-16 NOTE — H&P (Addendum)
Hospitalist Admission History and Physical  Patient name: Lisa Beck Medical record number: [id number] Date of birth: 01/18/1941 Age: 74 y.o. Gender: female  Primary Care Provider: Isabella Stalling, MD  Chief Complaint: chest pain   History of Present Illness:This is a 74 y.o. year old female with significant past medical history of HTN, IDDM, HLD presenting with chest pain. Pt reports progressive onset of central chest pain and pressure over last 24 hours. No radiation pain 4-5 at its worst. Also w/ intermittent chest pressure and fatigue over the last week. Denies any prior hx/o cardiac disease in the past. Chest pain/pressure peaked earlier today.  Presented to ER afebrile, hemodynamically stable. CBC and CMET grossly WNL. Trop neg x1. CXR WNL. EKG w/ ? t wave flattening per EDP (formal EKG not present). Bedside RUQ preliminarily negative for GB disease.   HEART Score 5  Assessment and Plan: Lisa Beck is a 74 y.o. year old female presenting with chest pain    Active Problems:   Chest pain   1- Chest Pain  -fairly typical sxs in pt w/ multiple CV RFs  -pain improved w/ NTG -trop neg x1 -? t wave flattening on EKG per EDP-case discussed w/ cards at [facility] per EDP-ok to stay  -full dose ASA -nitropaste -cycle CEs -abd u/s in am formally r/o GB disease as source of sxs -2D ECHO  -risk stratification labs  -tele bed -follow  -formal cards c/s in am   2-IDDM -SSI -A1C  3-HTN -BP stable  -cont home regimen   FEN/GI: heart healthy-carb modified diet  Prophylaxis: sub q hepari  Disposition: pending further evaluation  Code Status:Full Code    Patient Active Problem List   Diagnosis Date Noted  . Chest pain 06/15/2014   Past Medical History: Past Medical History  Diagnosis Date  . Hypertension   . Diabetes mellitus without complication   . Thyroid disease     Past Surgical History: History reviewed. No pertinent past surgical history.  Social  History: History   Social History  . Marital Status: Married    Spouse Name: N/A  . Number of Children: N/A  . Years of Education: N/A   Social History Main Topics  . Smoking status: Never Smoker   . Smokeless tobacco: Never Used  . Alcohol Use: No  . Drug Use: No  . Sexual Activity: Not on file   Other Topics Concern  . None   Social History Narrative  . None    Family History: No family history on file.  Allergies: No Known Allergies  Current Facility-Administered Medications  Medication Dose Route Frequency Provider Last Rate Last Dose  . amLODipine (NORVASC) tablet 5 mg  5 mg Oral Daily Floydene Flock, MD      . aspirin EC tablet 325 mg  325 mg Oral QPM Floydene Flock, MD      . atorvastatin (LIPITOR) tablet 40 mg  40 mg Oral QPM Floydene Flock, MD      . heparin injection 5,000 Units  5,000 Units Subcutaneous 3 times per day Floydene Flock, MD      . insulin aspart (novoLOG) injection 0-9 Units  0-9 Units Subcutaneous 6 times per day Floydene Flock, MD      . insulin glargine (LANTUS) injection 32 Units  32 Units Subcutaneous QHS Floydene Flock, MD      . levothyroxine (SYNTHROID, LEVOTHROID) tablet 100 mcg  100 mcg Oral QAC breakfast Floydene Flock, MD      .  metoprolol succinate (TOPROL-XL) 24 hr tablet 50 mg  50 mg Oral Daily Floydene FlockSteven J Nykolas Bacallao, MD      . morphine 2 MG/ML injection 1-2 mg  1-2 mg Intravenous Q3H PRN Floydene FlockSteven J Darina Hartwell, MD      . nitroGLYCERIN (NITRODUR - Dosed in mg/24 hr) patch 0.4 mg  0.4 mg Transdermal Daily Floydene FlockSteven J Theora Vankirk, MD      . ondansetron [facility](ZOFRAN) tablet 4 mg  4 mg Oral Q6H PRN Floydene FlockSteven J Jemila Camille, MD       Or  . ondansetron [facility](ZOFRAN) injection 4 mg  4 mg Intravenous Q6H PRN Floydene FlockSteven J Tamsen Reist, MD      . telmisartan-hydrochlorothiazide (MICARDIS HCT) 40-12.5 MG per tablet 1 tablet  1 tablet Oral Daily Floydene FlockSteven J Ladye Macnaughton, MD       Current Outpatient Prescriptions  Medication Sig Dispense Refill  . amLODipine (NORVASC) 5 MG tablet Take 5 mg by  mouth daily.    Marland Kitchen. aspirin EC 81 MG tablet Take 81 mg by mouth every evening.    Marland Kitchen. atorvastatin (LIPITOR) 40 MG tablet Take 40 mg by mouth every evening.    . insulin glargine (LANTUS) 100 UNIT/ML injection Inject 32 Units into the skin at bedtime.    Marland Kitchen. levothyroxine (SYNTHROID, LEVOTHROID) 100 MCG tablet Take 100 mcg by mouth daily before breakfast.    . metFORMIN (GLUCOPHAGE) 500 MG tablet Take 500 mg by mouth 2 (two) times daily.    . metoprolol succinate (TOPROL-XL) 50 MG 24 hr tablet Take 50 mg by mouth daily. Take with or immediately following a meal.    . telmisartan-hydrochlorothiazide (MICARDIS HCT) 40-12.5 MG per tablet Take 1 tablet by mouth daily.     Review Of Systems: 12 point ROS negative except as noted above in HPI.  Physical Exam: Filed Vitals:   06/15/14 2314  BP: 136/64  Pulse: 63  Temp: 97.9 F (36.6 C)  Resp: 18    General: alert and cooperative HEENT: PERRLA and extra ocular movement intact Heart: S1, S2 normal, no murmur, rub or gallop, regular rate and rhythm Lungs: clear to auscultation, no wheezes or rales and unlabored breathing Abdomen: abdomen is soft without significant tenderness, masses, organomegaly or guarding Extremities: extremities normal, atraumatic, no cyanosis or edema Skin:no rashes Neurology: normal without focal findings  Labs and Imaging: Lab Results  Component Value Date/Time   NA 134* 06/15/2014 07:50 PM   K 3.8 06/15/2014 07:50 PM   CL 102 06/15/2014 07:50 PM   CO2 23 06/15/2014 07:50 PM   BUN 17 06/15/2014 07:50 PM   CREATININE 0.88 06/15/2014 07:50 PM   GLUCOSE 246* 06/15/2014 07:50 PM   Lab Results  Component Value Date   WBC 8.1 06/15/2014   HGB 12.5 06/15/2014   HCT 37.5 06/15/2014   MCV 86.6 06/15/2014   PLT 285 06/15/2014    Dg Chest Port 1 View  06/15/2014   CLINICAL DATA:  Chest pain, shortness of breath, dizziness starting 4 p.m.  EXAM: PORTABLE CHEST - 1 VIEW  COMPARISON:  None.  FINDINGS: Cardiomediastinal  silhouette is unremarkable. No acute infiltrate or pleural effusion. No pulmonary edema. Bony thorax is unremarkable.  IMPRESSION: No active disease.   Electronically Signed   By: Natasha MeadLiviu  Pop M.D.   On: 06/15/2014 20:18           Doree AlbeeSteven Mignon Bechler MD  Pager: [phone]

## 2014-06-16 NOTE — Progress Notes (Signed)
Patient/Family oriented to room. Information packet given to patient/family. Admission inpatient armband information verified with patient/family to include name and date of birth and placed on patient arm. Side rails up x 2, fall assessment and education completed with patient/family. Call light within reach. Patient/family able to voice and demonstrate understanding of unit orientation instructions  

## 2014-06-16 NOTE — Care Management Note (Addendum)
    Page 1 of 1   06/17/2014     1:34:12 PM CARE MANAGEMENT NOTE 06/17/2014  Patient:  Lisa Beck,Lisa Beck   Account Number:  [id number]  Date Initiated:  06/16/2014  Documentation initiated by:  Sharrie RothmanBLACKWELL,Willie Loy C  Subjective/Objective Assessment:   Pt admitted from home with CP and abd pain. Pt lives with her husband and will return home at discharge. Pt is independent with ADL's.     Action/Plan:   No CM needs noted. Anticipate discharge within 24 hours.   Anticipated DC Date:  06/17/2014   Anticipated DC Plan:  HOME/SELF CARE      DC Planning Services  CM consult      Choice offered to / List presented to:             Status of service:  Completed, signed off Medicare Important Message given?   (If response is "NO", the following Medicare IM given date fields will be blank) Date Medicare IM given:   Medicare IM given by:   Date Additional Medicare IM given:   Additional Medicare IM given by:    Discharge Disposition:  HOME/SELF CARE  Per UR Regulation:    If discussed at Long Length of Stay Meetings, dates discussed:    Comments:  06/17/14 1330 Arlyss Queenammy Jennice Renegar, RN BSN CM Pt discharged home today. No CM needs noted.  06/16/14 1130 Arlyss Queenammy Wes Lezotte, RN BSN CM

## 2014-06-17 ENCOUNTER — Inpatient Hospital Stay (HOSPITAL_COMMUNITY): Payer: Medicare Other

## 2014-06-17 ENCOUNTER — Encounter (HOSPITAL_COMMUNITY): Payer: Self-pay

## 2014-06-17 DIAGNOSIS — R079 Chest pain, unspecified: Secondary | ICD-10-CM

## 2014-06-17 LAB — HEMOGLOBIN A1C
Hgb A1c MFr Bld: 7.9 % — ABNORMAL HIGH (ref 4.8–5.6)
Hgb A1c MFr Bld: 7.8 % — ABNORMAL HIGH (ref 4.8–5.6)
MEAN PLASMA GLUCOSE: 180 mg/dL
Mean Plasma Glucose: 177 mg/dL

## 2014-06-17 LAB — COMPREHENSIVE METABOLIC PANEL
ALBUMIN: 3.8 g/dL (ref 3.5–5.2)
ALT: 21 U/L (ref 0–35)
AST: 20 U/L (ref 0–37)
Alkaline Phosphatase: 79 U/L (ref 39–117)
Anion gap: 6 (ref 5–15)
BUN: 14 mg/dL (ref 6–23)
CHLORIDE: 104 mmol/L (ref 96–112)
CO2: 26 mmol/L (ref 19–32)
CREATININE: 0.7 mg/dL (ref 0.50–1.10)
Calcium: 9.5 mg/dL (ref 8.4–10.5)
GFR calc Af Amer: >90 mL/min (ref >90)
GFR calc non Af Amer: 83 mL/min — ABNORMAL LOW (ref >90)
GLUCOSE: 102 mg/dL — AB (ref 70–99)
POTASSIUM: 3.8 mmol/L (ref 3.5–5.1)
SODIUM: 136 mmol/L (ref 135–145)
Total Bilirubin: 1 mg/dL (ref 0.3–1.2)
Total Protein: 6.4 g/dL (ref 6.0–8.3)

## 2014-06-17 LAB — CBC WITH DIFFERENTIAL/PLATELET
BASOS ABS: 0 10*3/uL (ref 0.0–0.1)
Basophils Relative: 0 % (ref 0–1)
EOS ABS: 0.1 10*3/uL (ref 0.0–0.7)
EOS PCT: 1 % (ref 0–5)
HCT: 35.7 % — ABNORMAL LOW (ref 36.0–46.0)
Hemoglobin: 11.9 g/dL — ABNORMAL LOW (ref 12.0–15.0)
LYMPHS ABS: 1.4 10*3/uL (ref 0.7–4.0)
Lymphocytes Relative: 13 % (ref 12–46)
MCH: 28.7 pg (ref 26.0–34.0)
MCHC: 33.3 g/dL (ref 30.0–36.0)
MCV: 86 fL (ref 78.0–100.0)
Monocytes Absolute: 0.9 10*3/uL (ref 0.1–1.0)
Monocytes Relative: 9 % (ref 3–12)
NEUTROS PCT: 77 % (ref 43–77)
Neutro Abs: 8.2 10*3/uL — ABNORMAL HIGH (ref 1.7–7.7)
PLATELETS: 239 10*3/uL (ref 150–400)
RBC: 4.15 MIL/uL (ref 3.87–5.11)
RDW: 13.5 % (ref 11.5–15.5)
WBC: 10.6 10*3/uL — AB (ref 4.0–10.5)

## 2014-06-17 LAB — GLUCOSE, CAPILLARY
Glucose-Capillary: 74 mg/dL (ref 70–99)
Glucose-Capillary: 95 mg/dL (ref 70–99)
Glucose-Capillary: 171 mg/dL — ABNORMAL HIGH (ref 70–99)

## 2014-06-17 MED ORDER — TECHNETIUM TC 99M SESTAMIBI - CARDIOLITE
10.0000 | Freq: Once | INTRAVENOUS | Status: AC | PRN
  Administered 2014-06-17: 07:00:00 10 via INTRAVENOUS

## 2014-06-17 MED ORDER — TECHNETIUM TC 99M SESTAMIBI GENERIC - CARDIOLITE
30.0000 | Freq: Once | INTRAVENOUS | Status: AC | PRN
  Administered 2014-06-17: 30 via INTRAVENOUS

## 2014-06-17 MED ORDER — SODIUM CHLORIDE 0.9 % IJ SOLN
INTRAMUSCULAR | Status: AC
  Administered 2014-06-17: 10 mL via INTRAVENOUS
  Filled 2014-06-17: qty 3

## 2014-06-17 MED ORDER — SODIUM CHLORIDE 0.9 % IJ SOLN
10.0000 mL | INTRAMUSCULAR | Status: DC | PRN
  Administered 2014-06-17: 10 mL via INTRAVENOUS
  Filled 2014-06-17: qty 10

## 2014-06-17 MED ORDER — REGADENOSON 0.4 MG/5ML IV SOLN
INTRAVENOUS | Status: AC
  Filled 2014-06-17: qty 5

## 2014-06-17 NOTE — Progress Notes (Signed)
NURSING PROGRESS NOTE  Unk PintoCarolyn K Beck [id number] Discharge Data: 06/17/2014 3:12 PM Attending Provider: No att. providers found WUJ:[id number],GNFAOZHPCP:DONDIEGO,RICHARD Judie PetitM, MD   [name]    All IV's discontinued and monitored for bleeding.  All belongings returned to patient for patient to take home.  AVS summary reviewed with patient.  Patient left floor ambulatory, escorted by RN.  Last Documented Vital Signs:  Blood pressure 134/73, pulse 73, temperature 98.7 F (37.1 C), temperature source Oral, resp. rate 18, height 5\' 4"  (1.626 m), weight 63.05 kg (139 lb), SpO2 100 %.  Lisa Beck, Lisa Beck

## 2014-06-17 NOTE — Discharge Summary (Signed)
Physician Discharge Summary  Lisa Beck UJW:[id number] DOB: March 29, 1940 DOA: 06/15/2014  PCP: Lisa Stalling, MD  Admit date: 06/15/2014 Discharge date: 06/17/2014   Recommendations for Outpatient Follow-up:  Follow-up in my office within one week's time to assess resolution of symptoms racemic control and hemodynamic stability Discharge Diagnoses:  Active Problems:   Chest pain   Discharge Condition: Lisa Beck Weights   06/15/14 1947 06/16/14 0154  Weight: 139 lb (63.05 kg) 139 lb (63.05 kg)    History of present illness:  Recent has insulin dependent diabetes long-standing well-controlled. Hyperlipidemia well-controlled hypertension well-controlled was admitted with some atypical/chest pain with some typical components cardiac enzymes were negative serially seen in consultation by cardiology who ended up doing a nuclear perfusion scan which revealed no evidence of reversible ischemia and normal contractility wall myocardial segments  Hospital Course:  Due to no objective evidence of ischemia patient was discharged on her regular medicines prior to admission follow-up in my office within a week's time she is cautioned to take aspirin 81 mg per day she is exceedingly compliant  Procedures:    Consultations:  Cardiology  Discharge Instructions  Discharge Instructions    Discharge instructions    Complete by:  As directed      Discharge patient    Complete by:  As directed             Medication List    TAKE these medications        amLODipine 5 MG tablet  Commonly known as:  NORVASC  Take 5 mg by mouth daily.     aspirin EC 81 MG tablet  Take 81 mg by mouth every evening.     atorvastatin 40 MG tablet  Commonly known as:  LIPITOR  Take 40 mg by mouth every evening.     insulin glargine 100 UNIT/ML injection  Commonly known as:  LANTUS  Inject 32 Units into the skin at bedtime.     levothyroxine 100 MCG tablet  Commonly known as:  SYNTHROID,  LEVOTHROID  Take 100 mcg by mouth daily before breakfast.     metFORMIN 500 MG tablet  Commonly known as:  GLUCOPHAGE  Take 500 mg by mouth 2 (two) times daily.     metoprolol succinate 50 MG 24 hr tablet  Commonly known as:  TOPROL-XL  Take 50 mg by mouth daily. Take with or immediately following a meal.     telmisartan-hydrochlorothiazide 40-12.5 MG per tablet  Commonly known as:  MICARDIS HCT  Take 1 tablet by mouth daily.       No Known Allergies    The results of significant diagnostics from this hospitalization (including imaging, microbiology, ancillary and laboratory) are listed below for reference.    Significant Diagnostic Studies: Nm Myocar Multi W/spect W/wall Motion / Ef  06/17/2014   CLINICAL DATA:  74 year old female with no known history of coronary artery disease referred for chest pain.  EXAM: MYOCARDIAL IMAGING WITH SPECT (REST AND EXERCISE)  GATED LEFT VENTRICULAR WALL MOTION STUDY  LEFT VENTRICULAR EJECTION FRACTION  TECHNIQUE: Standard myocardial SPECT imaging was performed after resting intravenous injection of 10 mCi Tc-27m sestamibi. Subsequently, exercise tolerance test was performed by the patient under the supervision of the Cardiology staff. At peak-stress, 30 mCi Tc-95m sestamibi was injected intravenously and standard myocardial SPECT imaging was performed. Quantitative gated imaging was also performed to evaluate left ventricular wall motion, and estimate left ventricular ejection fraction.  COMPARISON:  None.  FINDINGS: Exercise  stress  The patient was stressed according to the Bruce protocol for 7 min and 30 seconds achieving a work level of 10.10 Mets. The resting heart rate is 78 beats per min rose to a maximal rate of 129 beats per min, representing 88% of the maximal age predicted heart rate. Resting blood pressure of 124/67 increased to 148/53. The test was stopped due to fatigue, the patient did not experience any chest pain. Baseline EKG shows sinus  rhythm. Stress EKG shows nonspecific ST/T changes in the inferior leads. There are no specific ischemic changes and no significant arrhythmias.  Perfusion: There is a medium size mild intensity inferior wall defect seen in the resting images. A similar but less intense defect is seen in the post stress images. The inferior wall has normal wall motion. Findings are consistent with sub- diaphragmatic attenuation. There are no other perfusion defects.  Wall Motion: Normal left ventricular wall motion. No left ventricular dilation.  Left Ventricular Ejection Fraction: 76 %  End diastolic volume 41 ml  End systolic volume 10 ml  IMPRESSION: 1. No reversible ischemia or infarction.  2. Normal left ventricular wall motion.  3. Left ventricular ejection fraction 76%  4. Low-risk stress test findings*. Duke treadmill score of 8 consistent with low risk for major cardiac events. Excellent exercise functional capacity (>150% of predicted based on age and gender)  *2012 Appropriate Use Criteria for Coronary Revascularization Focused Update: J Am Coll Cardiol. 2012;59(9):857-881. http://content.dementiazones.comonlinejacc.org/article.aspx?articleid=1201161   Electronically Signed   By: Dina RichJonathan  Branch   On: 06/17/2014 11:29   Dg Chest Port 1 View  06/15/2014   CLINICAL DATA:  Chest pain, shortness of breath, dizziness starting 4 p.m.  EXAM: PORTABLE CHEST - 1 VIEW  COMPARISON:  None.  FINDINGS: Cardiomediastinal silhouette is unremarkable. No acute infiltrate or pleural effusion. No pulmonary edema. Bony thorax is unremarkable.  IMPRESSION: No active disease.   Electronically Signed   By: Natasha MeadLiviu  Pop M.D.   On: 06/15/2014 20:18   [place] Abdomen Limited Ruq  06/16/2014   CLINICAL DATA:  One day history of lower chest pain  EXAM: US ABDOMEN LIMITED - RIGHT UPPER QUADRANT  COMPARISON:  CT abdomen and pelvis November 25, 2001  FINDINGS: Gallbladder:  No gallstones or wall thickening visualized. There is no pericholecystic fluid. No sonographic  Murphy sign noted.  Common bile duct:  Diameter: 3 mm. There is no intrahepatic or extrahepatic biliary duct dilatation.  Liver:  No focal lesion identified. Liver echogenicity is mildly increased overall.  IMPRESSION: Increased liver echogenicity, a finding that is most likely due to hepatic steatosis. While no focal liver lesions are identified, it must be cautioned that the sensitivity of ultrasound for focal liver lesions is diminished in this circumstance. Study otherwise unremarkable.   Electronically Signed   By: Bretta BangWilliam  Woodruff III M.D.   On: 06/16/2014 11:48    Microbiology: No results found for this or any previous visit (from the past 240 hour(s)).   Labs: Basic Metabolic Panel:  Recent Labs Lab 06/15/14 1950 06/16/14 0716 06/17/14 0651  NA 134* 138 136  K 3.8 3.7 3.8  CL 102 107 104  CO2 23 25 26   GLUCOSE 246* 89 102*  BUN 17 12 14   CREATININE 0.88 0.63 0.70  CALCIUM 9.9 9.5 9.5   Liver Function Tests:  Recent Labs Lab 06/15/14 1950 06/16/14 0716 06/17/14 0651  AST 27 23 20   ALT 25 21 21   ALKPHOS 90 77 79  BILITOT 0.3 0.5 1.0  PROT 7.2 6.3 6.4  ALBUMIN 4.1 3.6 3.8    Recent Labs Lab 06/15/14 1950  LIPASE 31   No results for input(s): AMMONIA in the last 168 hours. CBC:  Recent Labs Lab 06/15/14 1950 06/16/14 0716 06/17/14 0651  WBC 8.1 5.9 10.6*  NEUTROABS  --  3.0 8.2*  HGB 12.5 11.8* 11.9*  HCT 37.5 36.3 35.7*  MCV 86.6 85.6 86.0  PLT 285 251 239   Cardiac Enzymes:  Recent Labs Lab 06/15/14 1950 06/16/14 0023 06/16/14 0717 06/16/14 1159  TROPONINI <0.03 <0.03 <0.03 <0.03   BNP: BNP (last 3 results)  Recent Labs  06/15/14 1950  BNP 21.0    ProBNP (last 3 results) No results for input(s): PROBNP in the last 8760 hours.  CBG:  Recent Labs Lab 06/16/14 2039 06/16/14 2333 06/17/14 0358 06/17/14 0721 06/17/14 1130  GLUCAP 131* 126* 74 95 171*       Signed:  Karrah Mangini M  [facility] Pager:  [phone] 06/17/2014, 12:29 PM

## 2014-06-17 NOTE — Progress Notes (Signed)
Patient ID: Unk Pintoarolyn K Pujol, female   DOB: 06/01/1940, 74 y.o.   MRN: [id number]    Subjective:    No symptoms overnight   Objective:   Temp:  [98.1 F (36.7 C)-98.7 F (37.1 C)] 98.7 F (37.1 C) (03/31 0400) Pulse Rate:  [62-71] 71 (03/31 0400) Resp:  [18] 18 (03/31 0400) BP: (106-142)/(57-66) 108/61 mmHg (03/31 0400) SpO2:  [99 %-100 %] 100 % (03/31 0400) Last BM Date: 06/16/14  Filed Weights   06/15/14 1947 06/16/14 0154  Weight: 139 lb (63.05 kg) 139 lb (63.05 kg)    Intake/Output Summary (Last 24 hours) at 06/17/14 0915 Last data filed at 06/16/14 1200  Gross per 24 hour  Intake    360 ml  Output      0 ml  Net    360 ml    Telemetry: NSR  Exam:  General: NAD  Resp: CTAB  Cardiac: RRR, no m/r/g, no JVD  GI: abdomen soft, NT,ND  MSK:no LE edema  Neuro: no focal deficits   Lab Results:  Basic Metabolic Panel:  Recent Labs Lab 06/15/14 1950 06/16/14 0716 06/17/14 0651  NA 134* 138 136  K 3.8 3.7 3.8  CL 102 107 104  CO2 23 25 26   GLUCOSE 246* 89 102*  BUN 17 12 14   CREATININE 0.88 0.63 0.70  CALCIUM 9.9 9.5 9.5    Liver Function Tests:  Recent Labs Lab 06/15/14 1950 06/16/14 0716 06/17/14 0651  AST 27 23 20   ALT 25 21 21   ALKPHOS 90 77 79  BILITOT 0.3 0.5 1.0  PROT 7.2 6.3 6.4  ALBUMIN 4.1 3.6 3.8    CBC:  Recent Labs Lab 06/15/14 1950 06/16/14 0716 06/17/14 0651  WBC 8.1 5.9 10.6*  HGB 12.5 11.8* 11.9*  HCT 37.5 36.3 35.7*  MCV 86.6 85.6 86.0  PLT 285 251 239    Cardiac Enzymes:  Recent Labs Lab 06/16/14 0023 06/16/14 0717 06/16/14 1159  TROPONINI <0.03 <0.03 <0.03    BNP: No results for input(s): PROBNP in the last 8760 hours.  Coagulation: No results for input(s): INR in the last 168 hours.  ECG:   Medications:   Scheduled Medications: . amLODipine  5 mg Oral Daily  . aspirin EC  325 mg Oral QPM  . atorvastatin  40 mg Oral QPM  . heparin  5,000 Units Subcutaneous 3 times per day  . irbesartan   150 mg Oral Daily   And  . hydrochlorothiazide  12.5 mg Oral Daily  . insulin aspart  0-9 Units Subcutaneous 6 times per day  . insulin glargine  32 Units Subcutaneous QHS  . levothyroxine  100 mcg Oral QAC breakfast  . metoprolol succinate  50 mg Oral Daily  . nitroGLYCERIN  0.4 mg Transdermal Daily  . regadenoson      . sodium chloride  3 mL Intravenous Q12H  . sodium chloride         Infusions:     PRN Medications:  sodium chloride, morphine injection, ondansetron **OR** ondansetron (ZOFRAN) IV, sodium chloride, technetium sestamibi generic     Assessment/Plan    1. Chest pain - negative cardiac workup including EKGs and cardiac enzymes. Normal echo, stress test without any evidence of ischemia. - appears to be noncardiac chest pain. No further workup planned, ok for discharge from our standpoint.   No cardiology f/u needed     Dina RichJonathan Branch, M.D.

## 2014-06-17 NOTE — Discharge Instructions (Signed)
Chest Pain Observation  It is often hard to give a specific diagnosis for the cause of chest pain. Among other possibilities your symptoms might be caused by inadequate oxygen delivery to your heart (angina). Angina that is not treated or evaluated can lead to a heart attack (myocardial infarction) or death.  Blood tests, electrocardiograms, and X-rays may have been done to help determine a possible cause of your chest pain. After evaluation and observation, your health care provider has determined that it is unlikely your pain was caused by an unstable condition that requires hospitalization. However, a full evaluation of your pain may need to be completed, with additional diagnostic testing as directed. It is very important to keep your follow-up appointments. Not keeping your follow-up appointments could result in permanent heart damage, disability, or death. If there is any problem keeping your follow-up appointments, you must call your health care provider.  HOME CARE INSTRUCTIONS   Due to the slight chance that your pain could be angina, it is important to follow your health care provider's treatment plan and also maintain a healthy lifestyle:  · Maintain or work toward achieving a healthy weight.  · Stay physically active and exercise regularly.  · Decrease your salt intake.  · Eat a balanced, healthy diet. Talk to a dietitian to learn about heart-healthy foods.  · Increase your fiber intake by including whole grains, vegetables, fruits, and nuts in your diet.  · Avoid situations that cause stress, anger, or depression.  · Take medicines as advised by your health care provider. Report any side effects to your health care provider. Do not stop medicines or adjust the dosages on your own.  · Quit smoking. Do not use nicotine patches or gum until you check with your health care provider.  · Keep your blood pressure, blood sugar, and cholesterol levels within normal limits.  · Limit alcohol intake to no more than  1 drink per day for women who are not pregnant and 2 drinks per day for men.  · Do not abuse drugs.  SEEK IMMEDIATE MEDICAL CARE IF:  You have severe chest pain or pressure which may include symptoms such as:  · You feel pain or pressure in your arms, neck, jaw, or back.  · You have severe back or abdominal pain, feel sick to your stomach (nauseous), or throw up (vomit).  · You are sweating profusely.  · You are having a fast or irregular heartbeat.  · You feel short of breath while at rest.  · You notice increasing shortness of breath during rest, sleep, or with activity.  · You have chest pain that does not get better after rest or after taking your usual medicine.  · You wake from sleep with chest pain.  · You are unable to sleep because you cannot breathe.  · You develop a frequent cough or you are coughing up blood.  · You feel dizzy, faint, or experience extreme fatigue.  · You develop severe weakness, dizziness, fainting, or chills.  Any of these symptoms may represent a serious problem that is an emergency. Do not wait to see if the symptoms will go away. Call your local emergency services (911 in the [place] Do not drive yourself to the hospital.  MAKE SURE YOU:  · Understand these instructions.  · Will watch your condition.  · Will get help right away if you are not doing well or get worse.  Document Released: 04/07/2010 Document Revised: 03/10/2013 Document Reviewed: 09/04/2012    Exit[employer]® Patient Information ©2015 [employer]. This information is not intended to replace advice given to you by your health care provider. Make sure you discuss any questions you have with your health care provider.

## 2014-06-17 NOTE — Progress Notes (Signed)
Stress Lab Nurses Notes - [facility]  Unk PintoCarolyn K Limb 06/17/2014 Reason for doing test: Chest Pain Type of test: Stress Cardiolite/Inpatient Rm 334 Nurse performing test: Parke PoissonPhyllis Billingsly, RN Nuclear Medicine Tech: Lou CalLeslie Walker Echo Tech: Not Applicable MD performing test: Branch/K.Lyman BishopLawrence NP Family MD: [facility] PLL[facility]go Test explained and consent signed: Yes.   IV started: Saline lock flushed, No redness or edema and Saline lock from floor Symptoms: Fatigue in legs Treatment/Intervention: None Reason test stopped: fatigue After recovery IV was: No redness or edema and Saline Lock flushed Patient to return to Nuc. Med at : 10:00 Patient discharged: Transported back to room 334 via wc Patient's Condition upon discharge was: stable Comments: During test peak BP 148/53 & HR 129 .  Recovery BP 134/67 & HR 83 . Symptoms resolved in recovery. Erskine SpeedBillingsley, Tiamarie Furnari T

## 2014-09-24 ENCOUNTER — Other Ambulatory Visit (HOSPITAL_COMMUNITY): Payer: Self-pay | Admitting: Family Medicine

## 2014-09-24 DIAGNOSIS — Z1231 Encounter for screening mammogram for malignant neoplasm of breast: Secondary | ICD-10-CM

## 2014-10-11 ENCOUNTER — Ambulatory Visit (HOSPITAL_COMMUNITY)
Admission: RE | Admit: 2014-10-11 | Discharge: 2014-10-11 | Disposition: A | Discharge status: Home / Self Care | Payer: Medicare Other | Source: Ambulatory Visit | Attending: Family Medicine | Admitting: Family Medicine

## 2014-10-11 DIAGNOSIS — Z1231 Encounter for screening mammogram for malignant neoplasm of breast: Secondary | ICD-10-CM | POA: Insufficient documentation

## 2014-11-02 ENCOUNTER — Other Ambulatory Visit (HOSPITAL_COMMUNITY): Payer: Self-pay | Admitting: Family Medicine

## 2014-11-02 ENCOUNTER — Ambulatory Visit (HOSPITAL_COMMUNITY)
Admission: RE | Admit: 2014-11-02 | Discharge: 2014-11-02 | Disposition: A | Discharge status: Home / Self Care | Payer: Medicare Other | Source: Ambulatory Visit | Attending: Family Medicine | Admitting: Family Medicine

## 2014-11-02 DIAGNOSIS — M25562 Pain in left knee: Secondary | ICD-10-CM | POA: Diagnosis present

## 2014-11-02 DIAGNOSIS — M158 Other polyosteoarthritis: Secondary | ICD-10-CM

## 2015-11-16 ENCOUNTER — Other Ambulatory Visit (HOSPITAL_COMMUNITY): Payer: Self-pay | Admitting: Family Medicine

## 2015-11-16 DIAGNOSIS — Z1231 Encounter for screening mammogram for malignant neoplasm of breast: Secondary | ICD-10-CM

## 2015-11-28 ENCOUNTER — Ambulatory Visit (HOSPITAL_COMMUNITY)
Admission: RE | Admit: 2015-11-28 | Discharge: 2015-11-28 | Disposition: A | Discharge status: Home / Self Care | Payer: Medicare Other | Source: Ambulatory Visit | Attending: Family Medicine | Admitting: Family Medicine

## 2015-11-28 DIAGNOSIS — Z1231 Encounter for screening mammogram for malignant neoplasm of breast: Secondary | ICD-10-CM | POA: Diagnosis present

## 2016-11-02 ENCOUNTER — Other Ambulatory Visit (HOSPITAL_COMMUNITY): Payer: Self-pay | Admitting: Family Medicine

## 2016-11-02 DIAGNOSIS — Z1231 Encounter for screening mammogram for malignant neoplasm of breast: Secondary | ICD-10-CM

## 2016-11-28 ENCOUNTER — Ambulatory Visit (HOSPITAL_COMMUNITY)
Admission: RE | Admit: 2016-11-28 | Discharge: 2016-11-28 | Disposition: A | Discharge status: Home / Self Care | Payer: Medicare Other | Source: Ambulatory Visit | Attending: Family Medicine | Admitting: Family Medicine

## 2016-11-28 DIAGNOSIS — Z1231 Encounter for screening mammogram for malignant neoplasm of breast: Secondary | ICD-10-CM | POA: Diagnosis not present

## 2017-10-20 ENCOUNTER — Emergency Department (HOSPITAL_COMMUNITY): Payer: Medicare Other

## 2017-10-20 ENCOUNTER — Emergency Department (HOSPITAL_COMMUNITY)
Admission: EM | Admit: 2017-10-20 | Discharge: 2017-10-21 | Disposition: A | Discharge status: Home / Self Care | Payer: Medicare Other | Attending: Emergency Medicine | Admitting: Emergency Medicine

## 2017-10-20 ENCOUNTER — Other Ambulatory Visit: Payer: Self-pay

## 2017-10-20 ENCOUNTER — Encounter (HOSPITAL_COMMUNITY): Payer: Self-pay

## 2017-10-20 DIAGNOSIS — I10 Essential (primary) hypertension: Secondary | ICD-10-CM | POA: Diagnosis not present

## 2017-10-20 DIAGNOSIS — Z79899 Other long term (current) drug therapy: Secondary | ICD-10-CM | POA: Insufficient documentation

## 2017-10-20 DIAGNOSIS — R079 Chest pain, unspecified: Secondary | ICD-10-CM

## 2017-10-20 DIAGNOSIS — R0789 Other chest pain: Secondary | ICD-10-CM | POA: Diagnosis not present

## 2017-10-20 DIAGNOSIS — E119 Type 2 diabetes mellitus without complications: Secondary | ICD-10-CM | POA: Insufficient documentation

## 2017-10-20 DIAGNOSIS — Z794 Long term (current) use of insulin: Secondary | ICD-10-CM | POA: Diagnosis not present

## 2017-10-20 DIAGNOSIS — R112 Nausea with vomiting, unspecified: Secondary | ICD-10-CM | POA: Diagnosis not present

## 2017-10-20 DIAGNOSIS — Z7982 Long term (current) use of aspirin: Secondary | ICD-10-CM | POA: Diagnosis not present

## 2017-10-20 DIAGNOSIS — E039 Hypothyroidism, unspecified: Secondary | ICD-10-CM | POA: Diagnosis not present

## 2017-10-20 LAB — CBC
HCT: 30.9 % — ABNORMAL LOW (ref 36.0–46.0)
Hemoglobin: 9.5 g/dL — ABNORMAL LOW (ref 12.0–15.0)
MCH: 24.5 pg — AB (ref 26.0–34.0)
MCHC: 30.7 g/dL (ref 30.0–36.0)
MCV: 79.6 fL (ref 78.0–100.0)
Platelets: 328 10*3/uL (ref 150–400)
RBC: 3.88 MIL/uL (ref 3.87–5.11)
RDW: 15.3 % (ref 11.5–15.5)
WBC: 8.6 10*3/uL (ref 4.0–10.5)

## 2017-10-20 LAB — HEPATIC FUNCTION PANEL
ALT: 22 U/L (ref 0–44)
AST: 30 U/L (ref 15–41)
Albumin: 3.7 g/dL (ref 3.5–5.0)
Alkaline Phosphatase: 80 U/L (ref 38–126)
BILIRUBIN TOTAL: 0.6 mg/dL (ref 0.3–1.2)
Bilirubin, Direct: <0.1 mg/dL (ref 0.0–0.2)
Total Protein: 6.5 g/dL (ref 6.5–8.1)

## 2017-10-20 LAB — BASIC METABOLIC PANEL
Anion gap: 5 (ref 5–15)
BUN: 19 mg/dL (ref 8–23)
CALCIUM: 9.7 mg/dL (ref 8.9–10.3)
CHLORIDE: 104 mmol/L (ref 98–111)
CO2: 24 mmol/L (ref 22–32)
Creatinine, Ser: 0.9 mg/dL (ref 0.44–1.00)
GFR calc non Af Amer: 60 mL/min — ABNORMAL LOW (ref >60)
GLUCOSE: 185 mg/dL — AB (ref 70–99)
Potassium: 3.9 mmol/L (ref 3.5–5.1)
SODIUM: 133 mmol/L — AB (ref 135–145)

## 2017-10-20 LAB — I-STAT TROPONIN, ED: TROPONIN I, POC: 0 ng/mL (ref 0.00–0.08)

## 2017-10-20 LAB — LIPASE, BLOOD: Lipase: 51 U/L (ref 11–51)

## 2017-10-20 LAB — TROPONIN I: Troponin I: <0.03 ng/mL (ref <0.03)

## 2017-10-20 MED ORDER — SODIUM CHLORIDE 0.9 % IV BOLUS
1000.0000 mL | Freq: Once | INTRAVENOUS | Status: AC
  Administered 2017-10-20: 1000 mL via INTRAVENOUS

## 2017-10-20 MED ORDER — ALUM & MAG HYDROXIDE-SIMETH 200-200-20 MG/5ML PO SUSP
30.0000 mL | Freq: Once | ORAL | Status: AC
  Administered 2017-10-21: 30 mL via ORAL
  Filled 2017-10-20: qty 30

## 2017-10-20 MED ORDER — ONDANSETRON HCL 4 MG/2ML IJ SOLN
4.0000 mg | Freq: Once | INTRAMUSCULAR | Status: AC
  Administered 2017-10-20: 4 mg via INTRAVENOUS
  Filled 2017-10-20: qty 2

## 2017-10-20 NOTE — Discharge Instructions (Signed)
You were evaluated in the emergency department for chest pain and vomiting that occurred after you would not eat dinner.  We did an EKG chest x-ray and blood work did not show an obvious cause of your symptoms.  He will be important for you to follow-up with your doctor and your cardiologist.  Return if any concerning symptoms.

## 2017-10-20 NOTE — ED Notes (Signed)
Pt ambulated without difficulty

## 2017-10-20 NOTE — ED Triage Notes (Signed)
Chest pain started at 830 pm. 10/10 until took ntg at home. Pain went away. vomited at home.  VSS. Seen by cardiology last week and given ntg then. 324 Asprin given by ems 20 left ac

## 2017-10-20 NOTE — ED Provider Notes (Signed)
[facility] EMERGENCY DEPARTMENT Provider Note   CSN: [id number] Arrival date & time: 10/20/17  2140     History   Chief Complaint Chief Complaint  Patient presents with  . Chest Pain    HPI Lisa Beck is a 77 y.o. female.  77 year old female complaining of chest pain nausea vomiting.  It sounds like she went with her family to [facility] Tuesday's earlier and when she got home she started experiencing substernal chest pain 10 out of 10.  She took a nitro and then felt acutely nauseous and vomited.  She was diaphoretic at that time.  Her chest pain has resolved and she is only feeling a little bit nauseous now.  She said she is gotten chest pain here and there in the past and had a work-up about 18 months ago where they did not find anything and gave her some nitro to use as needed.  She received aspirin by EMS.  She denies any recent illness.  The history is provided by the patient, the spouse and a relative.  Chest Pain   This is a new problem. The current episode started less than 1 hour ago. The problem has been resolved. The pain is present in the substernal region. The pain is at a severity of 10/10. The quality of the pain is described as pressure-like. The pain does not radiate. Associated symptoms include diaphoresis, nausea and vomiting. Pertinent negatives include no abdominal pain, no back pain, no cough, no fever, no hemoptysis, no near-syncope, no numbness, no palpitations and no shortness of breath. She has tried nitroglycerin for the symptoms. The treatment provided significant relief. Risk factors include being elderly.  Her past medical history is significant for diabetes, hyperlipidemia and hypertension.  Pertinent negatives for past medical history include no seizures.    Past Medical History:  Diagnosis Date  . Diabetes mellitus without complication (HCC)   . Hyperlipidemia   . Hypertension   . Hypothyroidism     Patient Active Problem List   Diagnosis Date Noted   . Chest pain 06/15/2014    History reviewed. No pertinent surgical history.   OB History   None      Home Medications    Prior to Admission medications   Medication Sig Start Date End Date Taking? Authorizing Provider  amLODipine (NORVASC) 5 MG tablet Take 5 mg by mouth daily.    [provider]  aspirin EC 81 MG tablet Take 81 mg by mouth every evening.    [provider]  atorvastatin (LIPITOR) 40 MG tablet Take 40 mg by mouth every evening.    [provider]  insulin glargine (LANTUS) 100 UNIT/ML injection Inject 32 Units into the skin at bedtime.    [provider]  levothyroxine (SYNTHROID, LEVOTHROID) 100 MCG tablet Take 100 mcg by mouth daily before breakfast.    [provider]  metFORMIN (GLUCOPHAGE) 500 MG tablet Take 500 mg by mouth 2 (two) times daily.    [provider]  metoprolol succinate (TOPROL-XL) 50 MG 24 hr tablet Take 50 mg by mouth daily. Take with or immediately following a meal.    [provider]  telmisartan-hydrochlorothiazide (MICARDIS HCT) 40-12.5 MG per tablet Take 1 tablet by mouth daily.    [provider]    Family History Family History  Problem Relation Age of Onset  . CAD Father        MI sometime in middle-life, age unknown  . CAD Brother  Pt has 3 brothers that died of MIs between ages 1260s-70s  . CAD Brother   . CAD Brother     Social History Social History   Tobacco Use  . Smoking status: Never Smoker  . Smokeless tobacco: Never Used  Substance Use Topics  . Alcohol use: No  . Drug use: No     Allergies   Patient has no known allergies.   Review of Systems Review of Systems  Constitutional: Positive for diaphoresis. Negative for chills and fever.  HENT: Negative for ear pain and sore throat.   Eyes: Negative for pain and visual disturbance.  Respiratory: Negative for cough, hemoptysis and shortness of breath.   Cardiovascular: Positive for  chest pain. Negative for palpitations and near-syncope.  Gastrointestinal: Positive for nausea and vomiting. Negative for abdominal pain.  Genitourinary: Negative for dysuria and hematuria.  Musculoskeletal: Negative for arthralgias and back pain.  Skin: Negative for color change and rash.  Neurological: Negative for seizures, syncope and numbness.  All other systems reviewed and are negative.    Physical Exam Updated Vital Signs BP 131/67 (BP Location: Left Arm)   Pulse 85   Temp (!) 97.4 F (36.3 C) (Oral)   Resp 11   Ht 5\' 4"  (1.626 m)   Wt 63 kg (139 lb)   SpO2 95%   BMI 23.86 kg/m   Physical Exam  Constitutional: She appears well-developed and well-nourished. No distress.  HENT:  Head: Normocephalic and atraumatic.  Eyes: Pupils are equal, round, and reactive to light. Conjunctivae and EOM are normal.  Neck: Neck supple.  Cardiovascular: Normal rate, regular rhythm and normal pulses.  No murmur heard. Pulmonary/Chest: Effort normal and breath sounds normal. No respiratory distress.  Abdominal: Soft. There is no tenderness.  Musculoskeletal: Normal range of motion. She exhibits no edema.       Right lower leg: She exhibits no tenderness and no edema.       Left lower leg: She exhibits no tenderness and no edema.  Neurological: She is alert.  Skin: Skin is warm and dry. Capillary refill takes less than 2 seconds.  Psychiatric: She has a normal mood and affect.  Nursing note and vitals reviewed.    ED Treatments / Results  Labs (all labs ordered are listed, but only abnormal results are displayed) Labs Reviewed  BASIC METABOLIC PANEL - Abnormal; Notable for the following components:      Result Value   Sodium 133 (*)    Glucose, Bld 185 (*)    GFR calc non Af Amer 60 (*)    All other components within normal limits  CBC - Abnormal; Notable for the following components:   Hemoglobin 9.5 (*)    HCT 30.9 (*)    MCH 24.5 (*)    All other components within normal  limits  HEPATIC FUNCTION PANEL  LIPASE, BLOOD  TROPONIN I  I-STAT TROPONIN, ED    EKG EKG Interpretation  Date/Time:  Sunday October 20 2017 21:45:15 EDT Ventricular Rate:  86 PR Interval:    QRS Duration: 90 QT Interval:  366 QTC Calculation: 438 R Axis:   13 Text Interpretation:  Sinus rhythm no acute st/ts similar to prior 3/16 Confirmed by Meridee ScoreButler, Mckinzie Saksa [id number]) on 10/20/2017 10:30:37 PM   Radiology Dg Chest 2 View  Result Date: 10/20/2017 CLINICAL DATA:  Acute onset of vomiting and generalized chest pain. EXAM: CHEST - 2 VIEW COMPARISON:  Chest radiograph performed 06/15/2014 FINDINGS: The lungs are well-aerated and clear. There  is no evidence of focal opacification, pleural effusion or pneumothorax. The heart is borderline enlarged. No acute osseous abnormalities are seen. IMPRESSION: Borderline cardiomegaly; no acute cardiopulmonary process seen. Electronically Signed   By: Roanna Raider M.D.   On: 10/20/2017 22:16    Procedures Procedures (including critical care time)  Medications Ordered in ED Medications  sodium chloride 0.9 % bolus 1,000 mL (0 mLs Intravenous Stopped 10/21/17 0009)  ondansetron (ZOFRAN) injection 4 mg (4 mg Intravenous Given 10/20/17 2235)  alum & mag hydroxide-simeth (MAALOX/MYLANTA) 200-200-20 MG/5ML suspension 30 mL (30 mLs Oral Given 10/21/17 0008)     Initial Impression / Assessment and Plan / ED Course  I have reviewed the triage vital signs and the nursing notes.  Pertinent labs & imaging results that were available during my care of the patient were reviewed by me and considered in my medical decision making (see chart for details).  Clinical Course as of Oct 22 1102  Wynelle Link Oct 20, 2017  2347 Chest pain has not recurred.  She has had a little bit of belching here and still feels a little queasy.  Ordered some Maalox.  Her second troponin is negative so I think it would be okay for her to go home.  Her husband mentions she is had known gallstones  since possibly that this was a gallbladder attack because she would not need a hamburger this evening.  He understands to have her follow-up with your doctor and bring her back if any worsening symptoms.   [MB]  2359 Patient ambulated here without any difficulty.  Has been asked if we would prescribe her some Phenergan as he had good luck with that in the past.  They understand the need to follow-up with her doctor for further evaluation and return if any worsening symptoms.   [MB]    Clinical Course User Index [MB] Terrilee Files, MD     Final Clinical Impressions(s) / ED Diagnoses   Final diagnoses:  Nonspecific chest pain  Non-intractable vomiting with nausea, unspecified vomiting type    ED Discharge Orders    None       Terrilee Files, MD 10/21/17 1104

## 2017-10-21 DIAGNOSIS — R0789 Other chest pain: Secondary | ICD-10-CM | POA: Diagnosis not present

## 2017-10-21 MED ORDER — PROMETHAZINE HCL 12.5 MG PO TABS: 12.5000 mg | ORAL_TABLET | Freq: Four times a day (QID) | ORAL | 0 refills | Status: DC | PRN

## 2017-12-02 ENCOUNTER — Other Ambulatory Visit (HOSPITAL_COMMUNITY): Payer: Self-pay | Admitting: Family Medicine

## 2017-12-02 DIAGNOSIS — Z1231 Encounter for screening mammogram for malignant neoplasm of breast: Secondary | ICD-10-CM

## 2017-12-04 ENCOUNTER — Ambulatory Visit (HOSPITAL_COMMUNITY)
Admission: RE | Admit: 2017-12-04 | Discharge: 2017-12-04 | Disposition: A | Discharge status: Home / Self Care | Payer: Medicare Other | Source: Ambulatory Visit | Attending: Family Medicine | Admitting: Family Medicine

## 2017-12-04 DIAGNOSIS — Z1231 Encounter for screening mammogram for malignant neoplasm of breast: Secondary | ICD-10-CM

## 2018-03-04 ENCOUNTER — Other Ambulatory Visit (HOSPITAL_COMMUNITY): Payer: Self-pay | Admitting: Family Medicine

## 2018-03-04 DIAGNOSIS — R109 Unspecified abdominal pain: Principal | ICD-10-CM

## 2018-03-04 DIAGNOSIS — G8929 Other chronic pain: Secondary | ICD-10-CM

## 2018-03-10 ENCOUNTER — Ambulatory Visit (HOSPITAL_COMMUNITY)
Admission: RE | Admit: 2018-03-10 | Discharge: 2018-03-10 | Disposition: A | Discharge status: Home / Self Care | Payer: Medicare Other | Source: Ambulatory Visit | Attending: Family Medicine | Admitting: Family Medicine

## 2018-03-10 DIAGNOSIS — G8929 Other chronic pain: Secondary | ICD-10-CM | POA: Insufficient documentation

## 2018-03-10 DIAGNOSIS — R109 Unspecified abdominal pain: Secondary | ICD-10-CM | POA: Insufficient documentation

## 2018-03-10 LAB — POCT I-STAT CREATININE: CREATININE: 0.6 mg/dL (ref 0.44–1.00)

## 2018-03-10 MED ORDER — IOPAMIDOL (ISOVUE-300) INJECTION 61%
100.0000 mL | Freq: Once | INTRAVENOUS | Status: AC | PRN
  Administered 2018-03-10: 100 mL via INTRAVENOUS

## 2018-12-29 ENCOUNTER — Other Ambulatory Visit (HOSPITAL_COMMUNITY): Payer: Self-pay | Admitting: Family Medicine

## 2018-12-29 DIAGNOSIS — Z1231 Encounter for screening mammogram for malignant neoplasm of breast: Secondary | ICD-10-CM

## 2019-01-08 ENCOUNTER — Other Ambulatory Visit: Payer: Self-pay

## 2019-01-08 ENCOUNTER — Ambulatory Visit (HOSPITAL_COMMUNITY)
Admission: RE | Admit: 2019-01-08 | Discharge: 2019-01-08 | Disposition: A | Discharge status: Home / Self Care | Payer: Medicare Other | Source: Ambulatory Visit | Attending: Family Medicine | Admitting: Family Medicine

## 2019-01-08 DIAGNOSIS — Z1231 Encounter for screening mammogram for malignant neoplasm of breast: Secondary | ICD-10-CM | POA: Insufficient documentation

## 2019-04-05 ENCOUNTER — Encounter: Payer: Self-pay | Admitting: Gastroenterology

## 2019-04-05 NOTE — H&P (View-Only) (Signed)
Primary Care Physician:  Oval Linsey, MD  Primary Gastroenterologist:  Roetta Sessions, MD   Chief Complaint  Patient presents with  . abnormal labs  . Rectal Bleeding    denies having this problem    HPI:  Lisa Beck is a 80 y.o. female here at the request of Dr. Janna Arch for further evaluation of rectal bleeding and anemia.  Labs on March 31, 2019 showed hemoglobin of 8, hematocrit 27.2, MCV 68, platelets 172,000, white blood cell count 9900, iron saturation was 3%, TIBC 443, iron 15, B12 normal at 320, folate normal 12.5.  Review of previous labs in epic revealed a hemoglobin of 9.5 in August 2019.  Last CT imaging December 2019 at which time she was noted to have distal colonic diverticulosis.  Wall thickening involving the sigmoid colon may reflect incomplete distention and/or sequelae of chronic diverticular disease.  Large calcified uterine fibroid noted as well.  Two months of feeling real tired. Slight pain in RLQ for six months. Unrelated to meals or bowel movements. Intermittent in nature. Sometimes felt a fullness in this area. For one year, bowels a little different. Sometimes some loose stools. No constipation. No melena, brbpr. Recently started iron pills. No heartburn. No N/V. Appetite diminished, she wonders if it is related to Trulicity. Used to weight 135 a year ago. Overall she has felt significant fatigue.No SOB/DOE. No lightheadedness.  ASA 81mg  daily. No other NSAIDs. No ASA powders.  Last colonoscopy in 2007, diverticulosis.  Current Outpatient Medications  Medication Sig Dispense Refill  . amLODipine (NORVASC) 5 MG tablet Take 5 mg by mouth daily.    2008 atorvastatin (LIPITOR) 40 MG tablet Take 40 mg by mouth every evening.    . Dulaglutide (TRULICITY) 0.75 MG/0.5ML SOPN Inject into the skin. Once a week    . ferrous sulfate 325 (65 FE) MG tablet Take 325 mg by mouth 2 (two) times daily with a meal.    . insulin glargine (LANTUS) 100 UNIT/ML injection  Inject 26 Units into the skin at bedtime.     Marland Kitchen levothyroxine (SYNTHROID, LEVOTHROID) 100 MCG tablet Take 100 mcg by mouth daily before breakfast.    . metFORMIN (GLUCOPHAGE) 500 MG tablet Take 500 mg by mouth 2 (two) times daily.    . metoprolol succinate (TOPROL-XL) 50 MG 24 hr tablet Take 50 mg by mouth daily. Take with or immediately following a meal.    . telmisartan-hydrochlorothiazide (MICARDIS HCT) 40-12.5 MG per tablet Take 1 tablet by mouth daily.    Marland Kitchen aspirin EC 81 MG tablet Take 81 mg by mouth daily.      No current facility-administered medications for this visit.    Allergies as of 04/06/2019  . (No Known Allergies)    Past Medical History:  Diagnosis Date  . Diabetes mellitus without complication (HCC)   . Hyperlipidemia   . Hypertension   . Hypothyroidism     Past Surgical History:  Procedure Laterality Date  . COLONOSCOPY  2007   Dr. 2008: diverticulosis    Family History  Problem Relation Age of Onset  . CAD Father        MI sometime in middle-life, age unknown  . CAD Brother        Pt has 3 brothers that died of MIs between ages 24s-70s  . CAD Brother   . CAD Brother   . Colon cancer Neg Hx   . Ulcers Neg Hx     Social History   Socioeconomic History  .  Marital status: Married    Spouse name: Not on file  . Number of children: Not on file  . Years of education: Not on file  . Highest education level: Not on file  Occupational History  . Not on file  Tobacco Use  . Smoking status: Never Smoker  . Smokeless tobacco: Never Used  Substance and Sexual Activity  . Alcohol use: No  . Drug use: No  . Sexual activity: Not on file  Other Topics Concern  . Not on file  Social History Narrative  . Not on file   Social Determinants of Health   Financial Resource Strain:   . Difficulty of Paying Living Expenses: Not on file  Food Insecurity:   . Worried About Programme researcher, broadcasting/film/video in the Last Year: Not on file  . Ran Out of Food in the Last  Year: Not on file  Transportation Needs:   . Lack of Transportation (Medical): Not on file  . Lack of Transportation (Non-Medical): Not on file  Physical Activity:   . Days of Exercise per Week: Not on file  . Minutes of Exercise per Session: Not on file  Stress:   . Feeling of Stress : Not on file  Social Connections:   . Frequency of Communication with Friends and Family: Not on file  . Frequency of Social Gatherings with Friends and Family: Not on file  . Attends Religious Services: Not on file  . Active Member of Clubs or Organizations: Not on file  . Attends Banker Meetings: Not on file  . Marital Status: Not on file  Intimate Partner Violence:   . Fear of Current or Ex-Partner: Not on file  . Emotionally Abused: Not on file  . Physically Abused: Not on file  . Sexually Abused: Not on file      ROS:  General: Negative for anorexia, weight loss, fever, chills,weakness. See HPI Eyes: Negative for vision changes.  ENT: Negative for hoarseness, difficulty swallowing , nasal congestion. CV: Negative for chest pain, angina, palpitations, dyspnea on exertion, peripheral edema.  Respiratory: Negative for dyspnea at rest, dyspnea on exertion, cough, sputum, wheezing.  GI: See history of present illness. GU:  Negative for dysuria, hematuria, urinary incontinence, urinary frequency, nocturnal urination.  MS: Negative for joint pain, low back pain.  Derm: Negative for rash or itching.  Neuro: Negative for weakness, abnormal sensation, seizure, frequent headaches, memory loss, confusion.  Psych: Negative for anxiety, depression, suicidal ideation, hallucinations.  Endo: See HPI Heme: Negative for bruising or bleeding. Allergy: Negative for rash or hives.    Physical Examination:  BP 128/69   Pulse 96   Temp (!) 97.4 F (36.3 C) (Oral)   Ht 5\' 4"  (1.626 m)   Wt 126 lb 9.6 oz (57.4 kg)   BMI 21.73 kg/m    General: Well-nourished, well-developed in no acute  distress.  Head: Normocephalic, atraumatic.   Eyes: Conjunctiva pale, no icterus. Mouth: Masked Neck: Supple without thyromegaly, masses, or lymphadenopathy.  Lungs: Clear to auscultation bilaterally.  Heart: Regular rate and rhythm, no murmurs rubs or gallops.  Abdomen: Bowel sounds are normal, nontender, nondistended, no hepatosplenomegaly or masses, no abdominal bruits or    hernia , no rebound or guarding.   Rectal: No rectal mass. Nontender. Brown stool heme-negative. Extremities: No lower extremity edema. No clubbing or deformities.  Neuro: Alert and oriented x 4 , grossly normal neurologically.  Skin: Warm and dry, no rash or jaundice.   Psych: Alert  and cooperative, normal mood and affect.  Labs: January 2021: A1c 7.0   Imaging Studies: No results found.

## 2019-04-05 NOTE — Progress Notes (Signed)
Primary Care Physician:  Dondiego, Richard, MD  Primary Gastroenterologist:  Michael Rourk, MD   Chief Complaint  Patient presents with  . abnormal labs  . Rectal Bleeding    denies having this problem    HPI:  Lisa Beck is a 79 y.o. female here at the request of Dr. Dondiego for further evaluation of rectal bleeding and anemia.  Labs on March 31, 2019 showed hemoglobin of 8, hematocrit 27.2, MCV 68, platelets 172,000, white blood cell count 9900, iron saturation was 3%, TIBC 443, iron 15, B12 normal at 320, folate normal 12.5.  Review of previous labs in epic revealed a hemoglobin of 9.5 in August 2019.  Last CT imaging December 2019 at which time she was noted to have distal colonic diverticulosis.  Wall thickening involving the sigmoid colon may reflect incomplete distention and/or sequelae of chronic diverticular disease.  Large calcified uterine fibroid noted as well.  Two months of feeling real tired. Slight pain in RLQ for six months. Unrelated to meals or bowel movements. Intermittent in nature. Sometimes felt a fullness in this area. For one year, bowels a little different. Sometimes some loose stools. No constipation. No melena, brbpr. Recently started iron pills. No heartburn. No N/V. Appetite diminished, she wonders if it is related to Trulicity. Used to weight 135 a year ago. Overall she has felt significant fatigue.No SOB/DOE. No lightheadedness.  ASA 81mg daily. No other NSAIDs. No ASA powders.  Last colonoscopy in 2007, diverticulosis.  Current Outpatient Medications  Medication Sig Dispense Refill  . amLODipine (NORVASC) 5 MG tablet Take 5 mg by mouth daily.    . atorvastatin (LIPITOR) 40 MG tablet Take 40 mg by mouth every evening.    . Dulaglutide (TRULICITY) 0.75 MG/0.5ML SOPN Inject into the skin. Once a week    . ferrous sulfate 325 (65 FE) MG tablet Take 325 mg by mouth 2 (two) times daily with a meal.    . insulin glargine (LANTUS) 100 UNIT/ML injection  Inject 26 Units into the skin at bedtime.     . levothyroxine (SYNTHROID, LEVOTHROID) 100 MCG tablet Take 100 mcg by mouth daily before breakfast.    . metFORMIN (GLUCOPHAGE) 500 MG tablet Take 500 mg by mouth 2 (two) times daily.    . metoprolol succinate (TOPROL-XL) 50 MG 24 hr tablet Take 50 mg by mouth daily. Take with or immediately following a meal.    . telmisartan-hydrochlorothiazide (MICARDIS HCT) 40-12.5 MG per tablet Take 1 tablet by mouth daily.    . aspirin EC 81 MG tablet Take 81 mg by mouth daily.      No current facility-administered medications for this visit.    Allergies as of 04/06/2019  . (No Known Allergies)    Past Medical History:  Diagnosis Date  . Diabetes mellitus without complication (HCC)   . Hyperlipidemia   . Hypertension   . Hypothyroidism     Past Surgical History:  Procedure Laterality Date  . COLONOSCOPY  2007   Dr. Rehman: diverticulosis    Family History  Problem Relation Age of Onset  . CAD Father        MI sometime in middle-life, age unknown  . CAD Brother        Pt has 3 brothers that died of MIs between ages 60s-70s  . CAD Brother   . CAD Brother   . Colon cancer Neg Hx   . Ulcers Neg Hx     Social History   Socioeconomic History  .   Marital status: Married    Spouse name: Not on file  . Number of children: Not on file  . Years of education: Not on file  . Highest education level: Not on file  Occupational History  . Not on file  Tobacco Use  . Smoking status: Never Smoker  . Smokeless tobacco: Never Used  Substance and Sexual Activity  . Alcohol use: No  . Drug use: No  . Sexual activity: Not on file  Other Topics Concern  . Not on file  Social History Narrative  . Not on file   Social Determinants of Health   Financial Resource Strain:   . Difficulty of Paying Living Expenses: Not on file  Food Insecurity:   . Worried About Programme researcher, broadcasting/film/video in the Last Year: Not on file  . Ran Out of Food in the Last  Year: Not on file  Transportation Needs:   . Lack of Transportation (Medical): Not on file  . Lack of Transportation (Non-Medical): Not on file  Physical Activity:   . Days of Exercise per Week: Not on file  . Minutes of Exercise per Session: Not on file  Stress:   . Feeling of Stress : Not on file  Social Connections:   . Frequency of Communication with Friends and Family: Not on file  . Frequency of Social Gatherings with Friends and Family: Not on file  . Attends Religious Services: Not on file  . Active Member of Clubs or Organizations: Not on file  . Attends Banker Meetings: Not on file  . Marital Status: Not on file  Intimate Partner Violence:   . Fear of Current or Ex-Partner: Not on file  . Emotionally Abused: Not on file  . Physically Abused: Not on file  . Sexually Abused: Not on file      ROS:  General: Negative for anorexia, weight loss, fever, chills,weakness. See HPI Eyes: Negative for vision changes.  ENT: Negative for hoarseness, difficulty swallowing , nasal congestion. CV: Negative for chest pain, angina, palpitations, dyspnea on exertion, peripheral edema.  Respiratory: Negative for dyspnea at rest, dyspnea on exertion, cough, sputum, wheezing.  GI: See history of present illness. GU:  Negative for dysuria, hematuria, urinary incontinence, urinary frequency, nocturnal urination.  MS: Negative for joint pain, low back pain.  Derm: Negative for rash or itching.  Neuro: Negative for weakness, abnormal sensation, seizure, frequent headaches, memory loss, confusion.  Psych: Negative for anxiety, depression, suicidal ideation, hallucinations.  Endo: See HPI Heme: Negative for bruising or bleeding. Allergy: Negative for rash or hives.    Physical Examination:  BP 128/69   Pulse 96   Temp (!) 97.4 F (36.3 C) (Oral)   Ht 5\' 4"  (1.626 m)   Wt 126 lb 9.6 oz (57.4 kg)   BMI 21.73 kg/m    General: Well-nourished, well-developed in no acute  distress.  Head: Normocephalic, atraumatic.   Eyes: Conjunctiva pale, no icterus. Mouth: Masked Neck: Supple without thyromegaly, masses, or lymphadenopathy.  Lungs: Clear to auscultation bilaterally.  Heart: Regular rate and rhythm, no murmurs rubs or gallops.  Abdomen: Bowel sounds are normal, nontender, nondistended, no hepatosplenomegaly or masses, no abdominal bruits or    hernia , no rebound or guarding.   Rectal: No rectal mass. Nontender. Brown stool heme-negative. Extremities: No lower extremity edema. No clubbing or deformities.  Neuro: Alert and oriented x 4 , grossly normal neurologically.  Skin: Warm and dry, no rash or jaundice.   Psych: Alert  and cooperative, normal mood and affect.  Labs: January 2021: A1c 7.0   Imaging Studies: No results found.

## 2019-04-06 ENCOUNTER — Other Ambulatory Visit: Payer: Self-pay

## 2019-04-06 ENCOUNTER — Ambulatory Visit (INDEPENDENT_AMBULATORY_CARE_PROVIDER_SITE_OTHER): Payer: Medicare Other | Admitting: Gastroenterology

## 2019-04-06 ENCOUNTER — Encounter: Payer: Self-pay | Admitting: Gastroenterology

## 2019-04-06 ENCOUNTER — Telehealth: Payer: Self-pay

## 2019-04-06 VITALS — BP 128/69 | HR 96 | Temp 97.4°F | Ht 64.0 in | Wt 126.6 lb

## 2019-04-06 DIAGNOSIS — G8929 Other chronic pain: Secondary | ICD-10-CM | POA: Diagnosis not present

## 2019-04-06 DIAGNOSIS — D509 Iron deficiency anemia, unspecified: Secondary | ICD-10-CM | POA: Diagnosis not present

## 2019-04-06 DIAGNOSIS — R1011 Right upper quadrant pain: Secondary | ICD-10-CM | POA: Insufficient documentation

## 2019-04-06 DIAGNOSIS — R1031 Right lower quadrant pain: Secondary | ICD-10-CM

## 2019-04-06 DIAGNOSIS — R198 Other specified symptoms and signs involving the digestive system and abdomen: Secondary | ICD-10-CM

## 2019-04-06 MED ORDER — PEG 3350-KCL-NA BICARB-NACL 420 G PO SOLR: 4000.0000 mL | ORAL | 0 refills | Status: DC

## 2019-04-06 NOTE — Assessment & Plan Note (Addendum)
Very pleasant 79 year old female presenting for further evaluation of profound microcytic anemia/iron deficiency anemia. No overt bleeding noted. Remote colonoscopy in 2007. She has had some vague right lower quadrant discomfort off and on for 6 months, change in bowel habits for about a year, 10 pound weight loss over the past 1 year. She contributes some of the symptoms to starting Trulicity. No upper GI symptoms except for somewhat diminished appetite. CT 1 year ago with wall thickening of the sigmoid colon, large uterine fibroid.  Recommend a colonoscopy in the near future.  I have discussed the risks, alternatives, benefits with regards to but not limited to the risk of reaction to medication, bleeding, infection, perforation and the patient is agreeable to proceed. Written consent to be obtained.  If Colonoscopy is negative, upper endoscopy would be next step and may be possible same day depending on schedule availability (due to pandemic). Patient is aware.  I have discussed the risks, alternatives, benefits with regards to but not limited to the risk of reaction to medication, bleeding, infection, perforation and the patient is agreeable to proceed. Written consent to be obtained.  Repeat labs in 2 weeks to verify stability of hemoglobin and check her ferritin level. Patient is to call in the interim if she notices any changes, worsening weakness, overt bleeding.

## 2019-04-06 NOTE — Progress Notes (Signed)
Cc'ed to pcp °

## 2019-04-06 NOTE — Patient Instructions (Addendum)
1. Colonoscopy with possible upper endoscopy in the near future. If scheduling issues, we will schedule colonoscopy first so we can get done as soon as possible. See separate instructions. 2. Recheck labs in 2 weeks. 3. The following are links for vaccine sign ups at [facility] (you can schedule for [facility] in [place]) and [place]. You can also google [employer] Vaccine or [facility] Covid Vaccine.  At [facility] health they have a wait list option you can sign up for.  Check back frequently as they will continue to add new appointments as vaccine is available. [email or web address]  [email or web address] THIS ONE HAS A WAIT LIST OPTION  COVID-19 Vaccine Information can be found at: [email or web address] For questions related to vaccine distribution or appointments, please email [email or web address] or call [phone].

## 2019-04-06 NOTE — Telephone Encounter (Signed)
Called pt to schedule TCS/-/+EGD w/RMR, she was unable to hear on phone so she gave phone to her husband. Procedure scheduled for 04/24/19 at 9:15am. He is aware she will need to hold Iron for 7 days before procedure. Rx for prep sent to pharmacy. Orders entered.

## 2019-04-07 LAB — CBC WITH DIFFERENTIAL/PLATELET
Absolute Monocytes: 570 cells/uL (ref 200–950)
Basophils Absolute: 92 cells/uL (ref 0–200)
Basophils Relative: 1.2 %
Eosinophils Absolute: 154 cells/uL (ref 15–500)
Eosinophils Relative: 2 %
HCT: 27.1 % — ABNORMAL LOW (ref 35.0–45.0)
Hemoglobin: 8.2 g/dL — ABNORMAL LOW (ref 11.7–15.5)
Lymphs Abs: 2510 cells/uL (ref 850–3900)
MCH: 20.3 pg — ABNORMAL LOW (ref 27.0–33.0)
MCHC: 30.3 g/dL — ABNORMAL LOW (ref 32.0–36.0)
MCV: 67.1 fL — ABNORMAL LOW (ref 80.0–100.0)
MPV: 9.9 fL (ref 7.5–12.5)
Monocytes Relative: 7.4 %
Neutro Abs: 4374 cells/uL (ref 1500–7800)
Neutrophils Relative %: 56.8 %
Platelets: 489 10*3/uL — ABNORMAL HIGH (ref 140–400)
RBC: 4.04 10*6/uL (ref 3.80–5.10)
RDW: 16 % — ABNORMAL HIGH (ref 11.0–15.0)
Total Lymphocyte: 32.6 %
WBC: 7.7 10*3/uL (ref 3.8–10.8)

## 2019-04-07 LAB — FERRITIN: Ferritin: 6 ng/mL — ABNORMAL LOW (ref 16–288)

## 2019-04-07 NOTE — Telephone Encounter (Signed)
Pre-op and COVID test appt 04/22/19. Appt letter mailed with procedure instructions.

## 2019-04-20 NOTE — Patient Instructions (Signed)
HALIMAH BEWICK  04/20/2019     @PREFPERIOPPHARMACY @   Your procedure is scheduled on  04/24/2019 .  Report to 06/22/2019 at  0745  A.M.  Call this number if you have problems the morning of surgery:  [phone]   Remember:  Follow the diet and prep instructions given to you by Dr [phone] office.                    Take these medicines the morning of surgery with A SIP OF WATER  Amlodipine, levothyroxine, metoprolol. Take 1/2 of your usual night time insulin dosage the night before your procedure. DO NOT take any medications for diabetes the morning of your procedure.    Do not wear jewelry, make-up or nail polish.  Do not wear lotions, powders, or perfumes. Please wear deodorant and brush your teeth.  Do not shave 48 hours prior to surgery.  Men may shave face and neck.  Do not bring valuables to the hospital.  [facility] is not responsible for any belongings or valuables.  Contacts, dentures or bridgework may not be worn into surgery.  Leave your suitcase in the car.  After surgery it may be brought to your room.  For patients admitted to the hospital, discharge time will be determined by your treatment team.  Patients discharged the day of surgery will not be allowed to drive home.   Name and phone number of your driver:   family Special instructions:  None  Please read over the following fact sheets that you were given. Anesthesia Post-op Instructions and Care and Recovery After Surgery       Upper Endoscopy, Adult, Care After This sheet gives you information about how to care for yourself after your procedure. Your health care provider may also give you more specific instructions. If you have problems or questions, contact your health care provider. What can I expect after the procedure? After the procedure, it is common to have:  A sore throat.  Mild stomach pain or discomfort.  Bloating.  Nausea. Follow these instructions at home:   Follow  instructions from your health care provider about what to eat or drink after your procedure.  Return to your normal activities as told by your health care provider. Ask your health care provider what activities are safe for you.  Take over-the-counter and prescription medicines only as told by your health care provider.  Do not drive for 24 hours if you were given a sedative during your procedure.  Keep all follow-up visits as told by your health care provider. This is important. Contact a health care provider if you have:  A sore throat that lasts longer than one day.  Trouble swallowing. Get help right away if:  You vomit blood or your vomit looks like coffee grounds.  You have: ? A fever. ? Bloody, black, or tarry stools. ? A severe sore throat or you cannot swallow. ? Difficulty breathing. ? Severe pain in your chest or abdomen. Summary  After the procedure, it is common to have a sore throat, mild stomach discomfort, bloating, and nausea.  Do not drive for 24 hours if you were given a sedative during the procedure.  Follow instructions from your health care provider about what to eat or drink after your procedure.  Return to your normal activities as told by your health care provider. This information is not intended to replace advice given to you  by your health care provider. Make sure you discuss any questions you have with your health care provider. Document Revised: 08/27/2017 Document Reviewed: 08/05/2017 Elsevier Patient Education  [place].  Colonoscopy, Adult, Care After This sheet gives you information about how to care for yourself after your procedure. Your health care provider may also give you more specific instructions. If you have problems or questions, contact your health care provider. What can I expect after the procedure? After the procedure, it is common to have:  A small amount of blood in your stool for 24 hours after the  procedure.  Some gas.  Mild cramping or bloating of your abdomen. Follow these instructions at home: Eating and drinking   Drink enough fluid to keep your urine pale yellow.  Follow instructions from your health care provider about eating or drinking restrictions.  Resume your normal diet as instructed by your health care provider. Avoid heavy or fried foods that are hard to digest. Activity  Rest as told by your health care provider.  Avoid sitting for a long time without moving. Get up to take short walks every 1-2 hours. This is important to improve blood flow and breathing. Ask for help if you feel weak or unsteady.  Return to your normal activities as told by your health care provider. Ask your health care provider what activities are safe for you. Managing cramping and bloating   Try walking around when you have cramps or feel bloated.  Apply heat to your abdomen as told by your health care provider. Use the heat source that your health care provider recommends, such as a moist heat pack or a heating pad. ? Place a towel between your skin and the heat source. ? Leave the heat on for 20-30 minutes. ? Remove the heat if your skin turns bright red. This is especially important if you are unable to feel pain, heat, or cold. You may have a greater risk of getting burned. General instructions  For the first 24 hours after the procedure: ? Do not drive or use machinery. ? Do not sign important documents. ? Do not drink alcohol. ? Do your regular daily activities at a slower pace than normal. ? Eat soft foods that are easy to digest.  Take over-the-counter and prescription medicines only as told by your health care provider.  Keep all follow-up visits as told by your health care provider. This is important. Contact a health care provider if:  You have blood in your stool 2-3 days after the procedure. Get help right away if you have:  More than a small spotting of blood in  your stool.  Large blood clots in your stool.  Swelling of your abdomen.  Nausea or vomiting.  A fever.  Increasing pain in your abdomen that is not relieved with medicine. Summary  After the procedure, it is common to have a small amount of blood in your stool. You may also have mild cramping and bloating of your abdomen.  For the first 24 hours after the procedure, do not drive or use machinery, sign important documents, or drink alcohol.  Get help right away if you have a lot of blood in your stool, nausea or vomiting, a fever, or increased pain in your abdomen. This information is not intended to replace advice given to you by your health care provider. Make sure you discuss any questions you have with your health care provider. Document Revised: 09/29/2018 Document Reviewed: 09/29/2018 Elsevier Patient Education  [place] After These instructions provide you with information about caring for yourself after your procedure. Your health care provider may also give you more specific instructions. Your treatment has been planned according to current medical practices, but problems sometimes occur. Call your health care provider if you have any problems or questions after your procedure. What can I expect after the procedure? After your procedure, you may:  Feel sleepy for several hours.  Feel clumsy and have poor balance for several hours.  Feel forgetful about what happened after the procedure.  Have poor judgment for several hours.  Feel nauseous or vomit.  Have a sore throat if you had a breathing tube during the procedure. Follow these instructions at home: For at least 24 hours after the procedure:      Have a responsible adult stay with you. It is important to have someone help care for you until you are awake and alert.  Rest as needed.  Do not: ? Participate in activities in which you could fall or become  injured. ? Drive. ? Use heavy machinery. ? Drink alcohol. ? Take sleeping pills or medicines that cause drowsiness. ? Make important decisions or sign legal documents. ? Take care of children on your own. Eating and drinking  Follow the diet that is recommended by your health care provider.  If you vomit, drink water, juice, or soup when you can drink without vomiting.  Make sure you have little or no nausea before eating solid foods. General instructions  Take over-the-counter and prescription medicines only as told by your health care provider.  If you have sleep apnea, surgery and certain medicines can increase your risk for breathing problems. Follow instructions from your health care provider about wearing your sleep device: ? Anytime you are sleeping, including during daytime naps. ? While taking prescription pain medicines, sleeping medicines, or medicines that make you drowsy.  If you smoke, do not smoke without supervision.  Keep all follow-up visits as told by your health care provider. This is important. Contact a health care provider if:  You keep feeling nauseous or you keep vomiting.  You feel light-headed.  You develop a rash.  You have a fever. Get help right away if:  You have trouble breathing. Summary  For several hours after your procedure, you may feel sleepy and have poor judgment.  Have a responsible adult stay with you for at least 24 hours or until you are awake and alert. This information is not intended to replace advice given to you by your health care provider. Make sure you discuss any questions you have with your health care provider. Document Revised: 06/03/2017 Document Reviewed: 06/26/2015 Elsevier Patient Education  [place].

## 2019-04-22 ENCOUNTER — Encounter (HOSPITAL_COMMUNITY): Payer: Self-pay

## 2019-04-22 ENCOUNTER — Other Ambulatory Visit (HOSPITAL_COMMUNITY)
Admission: RE | Admit: 2019-04-22 | Discharge: 2019-04-22 | Disposition: A | Discharge status: Home / Self Care | Payer: Medicare Other | Source: Ambulatory Visit | Attending: Internal Medicine | Admitting: Internal Medicine

## 2019-04-22 ENCOUNTER — Encounter (HOSPITAL_COMMUNITY)
Admission: RE | Admit: 2019-04-22 | Discharge: 2019-04-22 | Disposition: A | Discharge status: Home / Self Care | Payer: Medicare Other | Source: Ambulatory Visit | Attending: Internal Medicine | Admitting: Internal Medicine

## 2019-04-22 ENCOUNTER — Other Ambulatory Visit: Payer: Self-pay

## 2019-04-22 DIAGNOSIS — Z01818 Encounter for other preprocedural examination: Secondary | ICD-10-CM | POA: Insufficient documentation

## 2019-04-22 DIAGNOSIS — Z20822 Contact with and (suspected) exposure to covid-19: Secondary | ICD-10-CM | POA: Diagnosis not present

## 2019-04-22 HISTORY — DX: Unspecified hearing loss, unspecified ear: H91.90

## 2019-04-22 HISTORY — DX: Personal history of urinary calculi: Z87.442

## 2019-04-22 HISTORY — DX: Unspecified osteoarthritis, unspecified site: M19.90

## 2019-04-22 LAB — BASIC METABOLIC PANEL
Anion gap: 8 (ref 5–15)
BUN: 18 mg/dL (ref 8–23)
CO2: 24 mmol/L (ref 22–32)
Calcium: 9.9 mg/dL (ref 8.9–10.3)
Chloride: 104 mmol/L (ref 98–111)
Creatinine, Ser: 0.86 mg/dL (ref 0.44–1.00)
GFR calc Af Amer: >60 mL/min (ref >60)
GFR calc non Af Amer: >60 mL/min (ref >60)
Glucose, Bld: 177 mg/dL — ABNORMAL HIGH (ref 70–99)
Potassium: 4.2 mmol/L (ref 3.5–5.1)
Sodium: 136 mmol/L (ref 135–145)

## 2019-04-22 LAB — SARS CORONAVIRUS 2 (TAT 6-24 HRS): SARS Coronavirus 2: NEGATIVE

## 2019-04-24 ENCOUNTER — Ambulatory Visit (HOSPITAL_COMMUNITY)
Admission: RE | Admit: 2019-04-24 | Discharge: 2019-04-24 | Disposition: A | Discharge status: Home / Self Care | Payer: Medicare Other | Source: Ambulatory Visit | Attending: Internal Medicine | Admitting: Internal Medicine

## 2019-04-24 ENCOUNTER — Other Ambulatory Visit: Payer: Self-pay

## 2019-04-24 ENCOUNTER — Ambulatory Visit (HOSPITAL_COMMUNITY): Payer: Medicare Other | Admitting: Anesthesiology

## 2019-04-24 ENCOUNTER — Encounter (HOSPITAL_COMMUNITY)
Admission: RE | Disposition: A | Discharge status: Home / Self Care | Payer: Self-pay | Source: Ambulatory Visit | Attending: Internal Medicine

## 2019-04-24 ENCOUNTER — Encounter (HOSPITAL_COMMUNITY): Payer: Self-pay | Admitting: Internal Medicine

## 2019-04-24 DIAGNOSIS — E039 Hypothyroidism, unspecified: Secondary | ICD-10-CM | POA: Insufficient documentation

## 2019-04-24 DIAGNOSIS — Z79899 Other long term (current) drug therapy: Secondary | ICD-10-CM | POA: Diagnosis not present

## 2019-04-24 DIAGNOSIS — Z8249 Family history of ischemic heart disease and other diseases of the circulatory system: Secondary | ICD-10-CM | POA: Diagnosis not present

## 2019-04-24 DIAGNOSIS — K317 Polyp of stomach and duodenum: Secondary | ICD-10-CM | POA: Diagnosis not present

## 2019-04-24 DIAGNOSIS — E785 Hyperlipidemia, unspecified: Secondary | ICD-10-CM | POA: Diagnosis not present

## 2019-04-24 DIAGNOSIS — K5730 Diverticulosis of large intestine without perforation or abscess without bleeding: Secondary | ICD-10-CM | POA: Diagnosis not present

## 2019-04-24 DIAGNOSIS — E109 Type 1 diabetes mellitus without complications: Secondary | ICD-10-CM | POA: Insufficient documentation

## 2019-04-24 DIAGNOSIS — Z794 Long term (current) use of insulin: Secondary | ICD-10-CM | POA: Insufficient documentation

## 2019-04-24 DIAGNOSIS — D509 Iron deficiency anemia, unspecified: Secondary | ICD-10-CM | POA: Insufficient documentation

## 2019-04-24 DIAGNOSIS — Z7982 Long term (current) use of aspirin: Secondary | ICD-10-CM | POA: Insufficient documentation

## 2019-04-24 DIAGNOSIS — K449 Diaphragmatic hernia without obstruction or gangrene: Secondary | ICD-10-CM | POA: Insufficient documentation

## 2019-04-24 DIAGNOSIS — K635 Polyp of colon: Secondary | ICD-10-CM | POA: Diagnosis not present

## 2019-04-24 DIAGNOSIS — I10 Essential (primary) hypertension: Secondary | ICD-10-CM | POA: Insufficient documentation

## 2019-04-24 HISTORY — PX: ESOPHAGOGASTRODUODENOSCOPY (EGD) WITH PROPOFOL: SHX5813

## 2019-04-24 HISTORY — PX: POLYPECTOMY: SHX5525

## 2019-04-24 HISTORY — PX: COLONOSCOPY WITH PROPOFOL: SHX5780

## 2019-04-24 LAB — GLUCOSE, CAPILLARY: Glucose-Capillary: 104 mg/dL — ABNORMAL HIGH (ref 70–99)

## 2019-04-24 LAB — POCT HEMOGLOBIN-HEMACUE: Hemoglobin: 10.1 g/dL — ABNORMAL LOW (ref 12.0–15.0)

## 2019-04-24 SURGERY — COLONOSCOPY WITH PROPOFOL
Anesthesia: General

## 2019-04-24 MED ORDER — PHENYLEPHRINE 40 MCG/ML (10ML) SYRINGE FOR IV PUSH (FOR BLOOD PRESSURE SUPPORT)
PREFILLED_SYRINGE | INTRAVENOUS | Status: DC | PRN
  Administered 2019-04-24 (×3): 40 ug via INTRAVENOUS

## 2019-04-24 MED ORDER — LACTATED RINGERS IV SOLN
INTRAVENOUS | Status: DC
  Administered 2019-04-24: 08:00:00 1000 mL via INTRAVENOUS

## 2019-04-24 MED ORDER — PROPOFOL 10 MG/ML IV BOLUS
INTRAVENOUS | Status: DC | PRN
  Administered 2019-04-24: 30 mg via INTRAVENOUS

## 2019-04-24 MED ORDER — PROPOFOL 500 MG/50ML IV EMUL
INTRAVENOUS | Status: DC | PRN
  Administered 2019-04-24: 150 ug/kg/min via INTRAVENOUS

## 2019-04-24 MED ORDER — KETAMINE HCL 10 MG/ML IJ SOLN
INTRAMUSCULAR | Status: DC | PRN
  Administered 2019-04-24: 30 mg via INTRAVENOUS

## 2019-04-24 MED ORDER — PHENYLEPHRINE 40 MCG/ML (10ML) SYRINGE FOR IV PUSH (FOR BLOOD PRESSURE SUPPORT)
PREFILLED_SYRINGE | INTRAVENOUS | Status: AC
  Filled 2019-04-24: qty 10

## 2019-04-24 MED ORDER — HYDROMORPHONE HCL 1 MG/ML IJ SOLN: 0.2500 mg | INTRAMUSCULAR | Status: DC | PRN

## 2019-04-24 MED ORDER — CHLORHEXIDINE GLUCONATE CLOTH 2 % EX PADS: 6.0000 | MEDICATED_PAD | Freq: Once | CUTANEOUS | Status: DC

## 2019-04-24 MED ORDER — GLYCOPYRROLATE 0.2 MG/ML IJ SOLN
INTRAMUSCULAR | Status: DC | PRN
  Administered 2019-04-24: .1 mg via INTRAVENOUS

## 2019-04-24 MED ORDER — KETAMINE HCL 50 MG/5ML IJ SOSY
PREFILLED_SYRINGE | INTRAMUSCULAR | Status: AC
  Filled 2019-04-24: qty 5

## 2019-04-24 MED ORDER — LACTATED RINGERS IV SOLN: INTRAVENOUS | Status: DC | PRN

## 2019-04-24 MED ORDER — PROMETHAZINE HCL 25 MG/ML IJ SOLN: 6.2500 mg | INTRAMUSCULAR | Status: DC | PRN

## 2019-04-24 MED ORDER — MIDAZOLAM HCL 2 MG/2ML IJ SOLN: 0.5000 mg | Freq: Once | INTRAMUSCULAR | Status: DC | PRN

## 2019-04-24 MED ORDER — LIDOCAINE HCL (CARDIAC) PF 100 MG/5ML IV SOSY
PREFILLED_SYRINGE | INTRAVENOUS | Status: DC | PRN
  Administered 2019-04-24: 30 mg via INTRATRACHEAL

## 2019-04-24 MED ORDER — HYDROCODONE-ACETAMINOPHEN 7.5-325 MG PO TABS: 1.0000 | ORAL_TABLET | Freq: Once | ORAL | Status: DC | PRN

## 2019-04-24 MED ORDER — STERILE WATER FOR IRRIGATION IR SOLN
Status: DC | PRN
  Administered 2019-04-24: 10:00:00 2.5 mL

## 2019-04-24 NOTE — Op Note (Signed)
[facility] Patient Name: Lisa Beck Procedure Date: 04/24/2019 9:24 AM MRN: [id number] Date of Birth: 03-May-1940 Attending MD: Gennette Pac , MD CSN: [id number] Age: 79 Admit Type: Outpatient Procedure:                Colonoscopy Indications:              Unexplained iron deficiency anemia Providers:                Gennette Pac, MD, Nena Polio, RN, Pandora Leiter, Technician Referring MD:              Medicines:                Propofol per Anesthesia Complications:            No immediate complications. Estimated Blood Loss:     Estimated blood loss was minimal. Procedure:                Pre-Anesthesia Assessment:                           - Prior to the procedure, a History and Physical                            was performed, and patient medications and                            allergies were reviewed. The patient's tolerance of                            previous anesthesia was also reviewed. The risks                            and benefits of the procedure and the sedation                            options and risks were discussed with the patient.                            All questions were answered, and informed consent                            was obtained. Prior Anticoagulants: The patient has                            taken no previous anticoagulant or antiplatelet                            agents. ASA Grade Assessment: II - A patient with                            mild systemic disease. After reviewing the risks  and benefits, the patient was deemed in                            satisfactory condition to undergo the procedure.                           After obtaining informed consent, the colonoscope                            was passed under direct vision. Throughout the                            procedure, the patient's blood pressure, pulse, and                            oxygen  saturations were monitored continuously. The                            CF-HQ190L ([id number]) scope was introduced through                            the anus and advanced to the the cecum, identified                            by appendiceal orifice and ileocecal valve. The                            colonoscopy was performed without difficulty. The                            patient tolerated the procedure well. The quality                            of the bowel preparation was adequate. The                            ileocecal valve, appendiceal orifice, and rectum                            were photographed. The colonoscopy was performed                            without difficulty. The patient tolerated the                            procedure well. The quality of the bowel                            preparation was adequate. Scope In: 9:36:43 AM Scope Out: 9:56:21 AM Scope Withdrawal Time: 0 hours 10 minutes 23 seconds  Total Procedure Duration: 0 hours 19 minutes 38 seconds  Findings:      The perianal and digital rectal examinations were normal.      Scattered medium-mouthed diverticula were found in the sigmoid colon.  Two semi-pedunculated polyps were found in the descending colon and       ascending colon. The polyps were 5 to 9 mm in size. These polyps were       removed with a cold snare. Resection and retrieval were complete.       Estimated blood loss was minimal.      The exam was otherwise without abnormality on direct and retroflexion       views. Impression:               - Diverticulosis in the sigmoid colon.                           - Two 5 to 9 mm polyps in the descending colon and                            in the ascending colon, removed with a cold snare.                            Resected and retrieved.                           - The examination was otherwise normal on direct                            and retroflexion views. Moderate Sedation:       Moderate (conscious) sedation was personally administered by an       anesthesia professional. The following parameters were monitored: oxygen       saturation, heart rate, blood pressure, respiratory rate, EKG, adequacy       of pulmonary ventilation, and response to care. Recommendation:           - Patient has a contact number available for                            emergencies. The signs and symptoms of potential                            delayed complications were discussed with the                            patient. Return to normal activities tomorrow.                            Written discharge instructions were provided to the                            patient.                           - Advance diet as tolerated.                           - Continue present medications.                           - Repeat colonoscopy date to be  determined after                            pending pathology results are reviewed for                            surveillance based on pathology results.                           - Return to GI office (date not yet determined). Procedure Code(s):        --- Professional ---                           [phone], Colonoscopy, flexible; with removal of                            tumor(s), polyp(s), or other lesion(s) by snare                            technique Diagnosis Code(s):        --- Professional ---                           K63.5, Polyp of colon                           D50.9, Iron deficiency anemia, unspecified                           K57.30, Diverticulosis of large intestine without                            perforation or abscess without bleeding CPT copyright 2019 American Medical Association. All rights reserved. The codes documented in this report are preliminary and upon coder review may  be revised to meet current compliance requirements. Cristopher Estimable. Brennon Otterness, MD Norvel Richards, MD 04/24/2019 10:00:13 AM This report has been signed  electronically. Number of Addenda: 0

## 2019-04-24 NOTE — Op Note (Signed)
[facility] Patient Name: Lisa Beck Procedure Date: 04/24/2019 9:58 AM MRN: [id number] Date of Birth: [birth year] Attending MD: Gennette Pac , MD CSN: [id number] Age: 79 Admit Type: Outpatient Procedure:                Upper GI endoscopy Indications:              Unexplained iron deficiency anemia Providers:                Gennette Pac, MD, Nena Polio, RN, Pandora Leiter, Technician Referring MD:              Medicines:                Propofol per Anesthesia Complications:            No immediate complications. Estimated blood loss:                            None. Estimated Blood Loss:     Estimated blood loss was minimal. Procedure:                Pre-Anesthesia Assessment:                           - Prior to the procedure, a History and Physical                            was performed, and patient medications and                            allergies were reviewed. The patient's tolerance of                            previous anesthesia was also reviewed. The risks                            and benefits of the procedure and the sedation                            options and risks were discussed with the patient.                            All questions were answered, and informed consent                            was obtained. Prior Anticoagulants: The patient has                            taken no previous anticoagulant or antiplatelet                            agents. ASA Grade Assessment: II - A patient with  mild systemic disease. After reviewing the risks                            and benefits, the patient was deemed in                            satisfactory condition to undergo the procedure.                           After obtaining informed consent, the endoscope was                            passed under direct vision. Throughout the                            procedure, the patient's  blood pressure, pulse, and                            oxygen saturations were monitored continuously. The                            [id number]-[id number] ([id number]) scope was introduced through the                            mouth, and advanced to the second part of duodenum.                            The upper GI endoscopy was accomplished without                            difficulty. The patient tolerated the procedure                            well. Scope In: 10:04:29 AM Scope Out: 10:19:02 AM Total Procedure Duration: 0 hours 14 minutes 33 seconds  Findings:      The examined esophagus was normal.      A small hiatal hernia was present.      One 7 mm pedunculated and sessile polyp was found in the gastric antrum.       The polyp was removed with a cold snare. Resection and retrieval were       complete. Estimated blood loss was minimal. Clips x3 to maintain       hemostasis.      The duodenal bulb and second portion of the duodenum were normal. Impression:               - Normal esophagus.                           - Small hiatal hernia.                           - One gastric polyp. Resected and retrieved. Clips                            placed.                           -  Normal duodenal bulb and second portion of the                            duodenum. Moderate Sedation:      Moderate (conscious) sedation was personally administered by an       anesthesia professional. The following parameters were monitored: oxygen       saturation, heart rate, blood pressure, and response to care. Recommendation:           - Patient has a contact number available for                            emergencies. The signs and symptoms of potential                            delayed complications were discussed with the                            patient. Return to normal activities tomorrow.                            Written discharge instructions were provided to the                             patient.                           - Advance diet as tolerated. Follow-up pathology.                            No MRI until clips gone. See colonoscopy report. Procedure Code(s):        --- Professional ---                           [phone], Esophagogastroduodenoscopy, flexible,                            transoral; with removal of tumor(s), polyp(s), or                            other lesion(s) by snare technique Diagnosis Code(s):        --- Professional ---                           K44.9, Diaphragmatic hernia without obstruction or                            gangrene                           K31.7, Polyp of stomach and duodenum                           D50.9, Iron deficiency anemia, unspecified CPT copyright 2019 American Medical Association. All rights reserved. The codes documented in this report are preliminary and upon coder review may  be revised to meet current  compliance requirements. Cristopher Estimable. Heyward Douthit, MD Norvel Richards, MD 04/24/2019 10:27:16 AM This report has been signed electronically. Number of Addenda: 0

## 2019-04-24 NOTE — Anesthesia Preprocedure Evaluation (Signed)
Anesthesia Evaluation  Patient identified by MRN, date of birth, ID band Patient awake    Reviewed: Allergy & Precautions, NPO status , Patient's Chart, lab work & pertinent test results, reviewed documented beta blocker date and time   Airway Mallampati: II  TM Distance: >3 FB Neck ROM: Full    Dental no notable dental hx. (+) Partial Lower   Pulmonary neg pulmonary ROS,    Pulmonary exam normal breath sounds clear to auscultation       Cardiovascular Exercise Tolerance: Good hypertension, Pt. on medications and Pt. on home beta blockers negative cardio ROS Normal cardiovascular examI Rhythm:Regular Rate:Normal     Neuro/Psych negative neurological ROS  negative psych ROS   GI/Hepatic negative GI ROS, Neg liver ROS, Bowel prep,Here for Colon/EGD   Endo/Other  diabetes, Well Controlled, Type 1, Oral Hypoglycemic Agents, Insulin DependentHypothyroidism   Renal/GU negative Renal ROS  negative genitourinary   Musculoskeletal  (+) Arthritis ,   Abdominal   Peds negative pediatric ROS (+)  Hematology negative hematology ROS (+) anemia , Last Hgb 04/07/19 was 8.2 down from 9 D/w Dr. Jena Gauss  Repeat ordered  Has received Iron- no blood    Anesthesia Other Findings   Reproductive/Obstetrics negative OB ROS                             Anesthesia Physical Anesthesia Plan  ASA: III  Anesthesia Plan: General   Post-op Pain Management:    Induction: Intravenous  PONV Risk Score and Plan: 3 and TIVA, Propofol infusion, Ondansetron and Treatment may vary due to age or medical condition  Airway Management Planned: Nasal Cannula and Simple Face Mask  Additional Equipment:   Intra-op Plan:   Post-operative Plan:   Informed Consent: I have reviewed the patients History and Physical, chart, labs and discussed the procedure including the risks, benefits and alternatives for the proposed  anesthesia with the patient or authorized representative who has indicated his/her understanding and acceptance.     Dental advisory given  Plan Discussed with: CRNA  Anesthesia Plan Comments: (Plan Full PPE use  Plan GA with GETA as needed d/w pt -WTP with same after Q&A)        Anesthesia Quick Evaluation

## 2019-04-24 NOTE — Transfer of Care (Signed)
Immediate Anesthesia Transfer of Care Note  Patient: Lisa Beck  Procedure(s) Performed: COLONOSCOPY WITH PROPOFOL (N/A ) ESOPHAGOGASTRODUODENOSCOPY (EGD) WITH PROPOFOL (N/A ) POLYPECTOMY  Patient Location: PACU  Anesthesia Type:General  Level of Consciousness: awake and alert   Airway & Oxygen Therapy: Patient Spontanous Breathing  Post-op Assessment: Report given to RN and Post -op Vital signs reviewed and stable  Post vital signs: Reviewed and stable  Last Vitals:  Vitals Value Taken Time  BP    Temp    Pulse    Resp    SpO2      Last Pain:  Vitals:   04/24/19 0813  TempSrc: Oral  PainSc: 0-No pain      Patients Stated Pain Goal: 7 (04/24/19 0813)  Complications: No apparent anesthesia complications

## 2019-04-24 NOTE — Anesthesia Postprocedure Evaluation (Signed)
Anesthesia Post Note  Patient: Lisa Beck  Procedure(s) Performed: COLONOSCOPY WITH PROPOFOL (N/A ) ESOPHAGOGASTRODUODENOSCOPY (EGD) WITH PROPOFOL (N/A ) POLYPECTOMY  Patient location during evaluation: PACU Anesthesia Type: General Level of consciousness: awake and alert Pain management: pain level controlled Vital Signs Assessment: post-procedure vital signs reviewed and stable Respiratory status: spontaneous breathing Cardiovascular status: stable Postop Assessment: no apparent nausea or vomiting Anesthetic complications: no     Last Vitals:  Vitals:   04/24/19 0813  BP: 129/68  Resp: 18  Temp: 36.6 C  SpO2: 98%    Last Pain:  Vitals:   04/24/19 0813  TempSrc: Oral  PainSc: 0-No pain                 Karman Biswell Hristova

## 2019-04-24 NOTE — Interval H&P Note (Signed)
History and Physical Interval Note:  04/24/2019 9:20 AM  Lisa Beck  has presented today for surgery, with the diagnosis of iron deficiency anemia, right lower quadrant pain, change in bowels.  The various methods of treatment have been discussed with the patient and family. After consideration of risks, benefits and other options for treatment, the patient has consented to  Procedure(s) with comments: COLONOSCOPY WITH PROPOFOL (N/A) - 9:15am ESOPHAGOGASTRODUODENOSCOPY (EGD) WITH PROPOFOL (N/A) as a surgical intervention.  The patient's history has been reviewed, patient examined, no change in status, stable for surgery.  I have reviewed the patient's chart and labs.  Questions were answered to the patient's satisfaction.     Lisa Beck   No change.  Diagnostic colonoscopy with possible EGD to follow depending on colonoscopy findings per plan.  The risks, benefits, limitations, alternatives and imponderables have been reviewed with the patient. Questions have been answered. All parties are agreeable.

## 2019-04-24 NOTE — Discharge Instructions (Signed)
Colonoscopy Discharge Instructions  Read the instructions outlined below and refer to this sheet in the next few weeks. These discharge instructions provide you with general information on caring for yourself after you leave the hospital. Your doctor may also give you specific instructions. While your treatment has been planned according to the most current medical practices available, unavoidable complications occasionally occur. If you have any problems or questions after discharge, call Dr. Gala Romney at [phone]. ACTIVITY  You may resume your regular activity, but move at a slower pace for the next 24 hours.   Take frequent rest periods for the next 24 hours.   Walking will help get rid of the air and reduce the bloated feeling in your belly (abdomen).   No driving for 24 hours (because of the medicine (anesthesia) used during the test).    Do not sign any important legal documents or operate any machinery for 24 hours (because of the anesthesia used during the test).  NUTRITION  Drink plenty of fluids.   You may resume your normal diet as instructed by your doctor.   Begin with a light meal and progress to your normal diet. Heavy or fried foods are harder to digest and may make you feel sick to your stomach (nauseated).   Avoid alcoholic beverages for 24 hours or as instructed.  MEDICATIONS  You may resume your normal medications unless your doctor tells you otherwise.  WHAT YOU CAN EXPECT TODAY  Some feelings of bloating in the abdomen.   Passage of more gas than usual.   Spotting of blood in your stool or on the toilet paper.  IF YOU HAD POLYPS REMOVED DURING THE COLONOSCOPY:  No aspirin products for 7 days or as instructed.   No alcohol for 7 days or as instructed.   Eat a soft diet for the next 24 hours.  FINDING OUT THE RESULTS OF YOUR TEST Not all test results are available during your visit. If your test results are not back during the visit, make an appointment  with your caregiver to find out the results. Do not assume everything is normal if you have not heard from your caregiver or the medical facility. It is important for you to follow up on all of your test results.  SEEK IMMEDIATE MEDICAL ATTENTION IF:  You have more than a spotting of blood in your stool.   Your belly is swollen (abdominal distention).   You are nauseated or vomiting.   You have a temperature over 101.   You have abdominal pain or discomfort that is severe or gets worse throughout the day.  EGD Discharge instructions Please read the instructions outlined below and refer to this sheet in the next few weeks. These discharge instructions provide you with general information on caring for yourself after you leave the hospital. Your doctor may also give you specific instructions. While your treatment has been planned according to the most current medical practices available, unavoidable complications occasionally occur. If you have any problems or questions after discharge, please call your doctor. ACTIVITY  You may resume your regular activity but move at a slower pace for the next 24 hours.   Take frequent rest periods for the next 24 hours.   Walking will help expel (get rid of) the air and reduce the bloated feeling in your abdomen.   No driving for 24 hours (because of the anesthesia (medicine) used during the test).   You may shower.   Do not sign any important  legal documents or operate any machinery for 24 hours (because of the anesthesia used during the test).  NUTRITION  Drink plenty of fluids.   You may resume your normal diet.   Begin with a light meal and progress to your normal diet.   Avoid alcoholic beverages for 24 hours or as instructed by your caregiver.  MEDICATIONS  You may resume your normal medications unless your caregiver tells you otherwise.  WHAT YOU CAN EXPECT TODAY  You may experience abdominal discomfort such as a feeling of fullness  or "gas" pains.  FOLLOW-UP  Your doctor will discuss the results of your test with you.  SEEK IMMEDIATE MEDICAL ATTENTION IF ANY OF THE FOLLOWING OCCUR:  Excessive nausea (feeling sick to your stomach) and/or vomiting.   Severe abdominal pain and distention (swelling).   Trouble swallowing.   Temperature over 101 F (37.8 C).   Rectal bleeding or vomiting of blood.   Stomach and colon polyps removed  No future MRI until clips gone  Polyp and diverticulosis information provided  Further recommendations to follow pending review of pathology report  At patient request, I called husband at 3 [phone]-call rolled to voicemail.       Colon Polyps  Polyps are tissue growths inside the body. Polyps can grow in many places, including the large intestine (colon). A polyp may be a round bump or a mushroom-shaped growth. You could have one polyp or several. Most colon polyps are noncancerous (benign). However, some colon polyps can become cancerous over time. Finding and removing the polyps early can help prevent this. What are the causes? The exact cause of colon polyps is not known. What increases the risk? You are more likely to develop this condition if you:  Have a family history of colon cancer or colon polyps.  Are older than 66 or older than 45 if you are African American.  Have inflammatory bowel disease, such as ulcerative colitis or Crohn's disease.  Have certain hereditary conditions, such as: ? Familial adenomatous polyposis. ? Lynch syndrome. ? Turcot syndrome. ? Peutz-Jeghers syndrome.  Are overweight.  Smoke cigarettes.  Do not get enough exercise.  Drink too much alcohol.  Eat a diet that is high in fat and red meat and low in fiber.  Had childhood cancer that was treated with abdominal radiation. What are the signs or symptoms? Most polyps do not cause symptoms. If you have symptoms, they may include:  Blood coming from your rectum when  having a bowel movement.  Blood in your stool. The stool may look dark red or black.  Abdominal pain.  A change in bowel habits, such as constipation or diarrhea. How is this diagnosed? This condition is diagnosed with a colonoscopy. This is a procedure in which a lighted, flexible scope is inserted into the anus and then passed into the colon to examine the area. Polyps are sometimes found when a colonoscopy is done as part of routine cancer screening tests. How is this treated? Treatment for this condition involves removing any polyps that are found. Most polyps can be removed during a colonoscopy. Those polyps will then be tested for cancer. Additional treatment may be needed depending on the results of testing. Follow these instructions at home: Lifestyle  Maintain a healthy weight, or lose weight if recommended by your health care provider.  Exercise every day or as told by your health care provider.  Do not use any products that contain nicotine or tobacco, such as cigarettes and  e-cigarettes. If you need help quitting, ask your health care provider.  If you drink alcohol, limit how much you have: ? 0-1 drink a day for women. ? 0-2 drinks a day for men.  Be aware of how much alcohol is in your drink. In the [place] one drink equals one 12 oz bottle of beer (355 mL), one 5 oz glass of wine (148 mL), or one 1 oz shot of hard liquor (44 mL). Eating and drinking   Eat foods that are high in fiber, such as fruits, vegetables, and whole grains.  Eat foods that are high in calcium and vitamin D, such as milk, cheese, yogurt, eggs, liver, fish, and broccoli.  Limit foods that are high in fat, such as fried foods and desserts.  Limit the amount of red meat and processed meat you eat, such as hot dogs, sausage, bacon, and lunch meats. General instructions  Keep all follow-up visits as told by your health care provider. This is important. ? This includes having regularly scheduled  colonoscopies. ? Talk to your health care provider about when you need a colonoscopy. Contact a health care provider if:  You have new or worsening bleeding during a bowel movement.  You have new or increased blood in your stool.  You have a change in bowel habits.  You lose weight for no known reason. Summary  Polyps are tissue growths inside the body. Polyps can grow in many places, including the colon.  Most colon polyps are noncancerous (benign), but some can become cancerous over time.  This condition is diagnosed with a colonoscopy.  Treatment for this condition involves removing any polyps that are found. Most polyps can be removed during a colonoscopy. This information is not intended to replace advice given to you by your health care provider. Make sure you discuss any questions you have with your health care provider. Document Revised: 06/20/2017 Document Reviewed: 06/20/2017 Elsevier Patient Education  2020 [employer].    Diverticulosis  Diverticulosis is a condition that develops when small pouches (diverticula) form in the wall of the large intestine (colon). The colon is where water is absorbed and stool (feces) is formed. The pouches form when the inside layer of the colon pushes through weak spots in the outer layers of the colon. You may have a few pouches or many of them. The pouches usually do not cause problems unless they become inflamed or infected. When this happens, the condition is called diverticulitis. What are the causes? The cause of this condition is not known. What increases the risk? The following factors may make you more likely to develop this condition:  Being older than age 66. Your risk for this condition increases with age. Diverticulosis is rare among people younger than age 82. By age 15, many people have it.  Eating a low-fiber diet.  Having frequent constipation.  Being overweight.  Not getting enough  exercise.  Smoking.  Taking over-the-counter pain medicines, like aspirin and ibuprofen.  Having a family history of diverticulosis. What are the signs or symptoms? In most people, there are no symptoms of this condition. If you do have symptoms, they may include:  Bloating.  Cramps in the abdomen.  Constipation or diarrhea.  Pain in the lower left side of the abdomen. How is this diagnosed? Because diverticulosis usually has no symptoms, it is most often diagnosed during an exam for other colon problems. The condition may be diagnosed by:  Using a flexible scope to examine the colon (  colonoscopy).  Taking an X-ray of the colon after dye has been put into the colon (barium enema).  Having a CT scan. How is this treated? You may not need treatment for this condition. Your health care provider may recommend treatment to prevent problems. You may need treatment if you have symptoms or if you previously had diverticulitis. Treatment may include:  Eating a high-fiber diet.  Taking a fiber supplement.  Taking a live bacteria supplement (probiotic).  Taking medicine to relax your colon. Follow these instructions at home: Medicines  Take over-the-counter and prescription medicines only as told by your health care provider.  If told by your health care provider, take a fiber supplement or probiotic. Constipation prevention Your condition may cause constipation. To prevent or treat constipation, you may need to:  Drink enough fluid to keep your urine pale yellow.  Take over-the-counter or prescription medicines.  Eat foods that are high in fiber, such as beans, whole grains, and fresh fruits and vegetables.  Limit foods that are high in fat and processed sugars, such as fried or sweet foods.  General instructions  Try not to strain when you have a bowel movement.  Keep all follow-up visits as told by your health care provider. This is important. Contact a health care  provider if you:  Have pain in your abdomen.  Have bloating.  Have cramps.  Have not had a bowel movement in 3 days. Get help right away if:  Your pain gets worse.  Your bloating becomes very bad.  You have a fever or chills, and your symptoms suddenly get worse.  You vomit.  You have bowel movements that are bloody or black.  You have bleeding from your rectum. Summary  Diverticulosis is a condition that develops when small pouches (diverticula) form in the wall of the large intestine (colon).  You may have a few pouches or many of them.  This condition is most often diagnosed during an exam for other colon problems.  Treatment may include increasing the fiber in your diet, taking supplements, or taking medicines. This information is not intended to replace advice given to you by your health care provider. Make sure you discuss any questions you have with your health care provider. Document Revised: 10/02/2018 Document Reviewed: 10/02/2018 Elsevier Patient Education  [place] After These instructions provide you with information about caring for yourself after your procedure. Your health care provider may also give you more specific instructions. Your treatment has been planned according to current medical practices, but problems sometimes occur. Call your health care provider if you have any problems or questions after your procedure. What can I expect after the procedure? After your procedure, you may:  Feel sleepy for several hours.  Feel clumsy and have poor balance for several hours.  Feel forgetful about what happened after the procedure.  Have poor judgment for several hours.  Feel nauseous or vomit.  Have a sore throat if you had a breathing tube during the procedure. Follow these instructions at home: For at least 24 hours after the procedure:      Have a responsible adult stay with you. It is important  to have someone help care for you until you are awake and alert.  Rest as needed.  Do not: ? Participate in activities in which you could fall or become injured. ? Drive. ? Use heavy machinery. ? Drink alcohol. ? Take sleeping pills or medicines that cause  drowsiness. ? Make important decisions or sign legal documents. ? Take care of children on your own. Eating and drinking  Follow the diet that is recommended by your health care provider.  If you vomit, drink water, juice, or soup when you can drink without vomiting.  Make sure you have little or no nausea before eating solid foods. General instructions  Take over-the-counter and prescription medicines only as told by your health care provider.  If you have sleep apnea, surgery and certain medicines can increase your risk for breathing problems. Follow instructions from your health care provider about wearing your sleep device: ? Anytime you are sleeping, including during daytime naps. ? While taking prescription pain medicines, sleeping medicines, or medicines that make you drowsy.  If you smoke, do not smoke without supervision.  Keep all follow-up visits as told by your health care provider. This is important. Contact a health care provider if:  You keep feeling nauseous or you keep vomiting.  You feel light-headed.  You develop a rash.  You have a fever. Get help right away if:  You have trouble breathing. Summary  For several hours after your procedure, you may feel sleepy and have poor judgment.  Have a responsible adult stay with you for at least 24 hours or until you are awake and alert. This information is not intended to replace advice given to you by your health care provider. Make sure you discuss any questions you have with your health care provider. Document Revised: 06/03/2017 Document Reviewed: 06/26/2015 Elsevier Patient Education  2020 [employer].

## 2019-04-27 LAB — SURGICAL PATHOLOGY

## 2019-04-28 ENCOUNTER — Encounter: Payer: Self-pay | Admitting: Internal Medicine

## 2019-04-28 ENCOUNTER — Telehealth: Payer: Self-pay

## 2019-04-28 NOTE — Telephone Encounter (Signed)
Per RMR- Send letter to patient.  Send copy of letter with path to referring provider and PCP.  Need appointment in 4 to 6 weeks with app. Follow-up IDA to see if any further GI evaluation needed.

## 2019-04-28 NOTE — Telephone Encounter (Signed)
OV made and letter mailed °

## 2019-05-26 ENCOUNTER — Other Ambulatory Visit: Payer: Self-pay

## 2019-05-26 ENCOUNTER — Ambulatory Visit (INDEPENDENT_AMBULATORY_CARE_PROVIDER_SITE_OTHER): Payer: Medicare Other | Admitting: Gastroenterology

## 2019-05-26 ENCOUNTER — Encounter: Payer: Self-pay | Admitting: Gastroenterology

## 2019-05-26 VITALS — BP 151/80 | HR 113 | Temp 96.9°F | Ht 64.0 in | Wt 127.0 lb

## 2019-05-26 DIAGNOSIS — D509 Iron deficiency anemia, unspecified: Secondary | ICD-10-CM

## 2019-05-26 NOTE — Progress Notes (Signed)
Primary Care Physician: Oval Linsey, MD  Primary Gastroenterologist:  Roetta Sessions, MD   Chief Complaint  Patient presents with  . Follow-up    FU from EGD/TCS    HPI: Lisa Beck is a 79 y.o. female here for follow-up of iron deficiency anemia.  She was seen back in January with anemia and rectal bleeding.  Hemoglobin 8, hematocrit 27.2, MCV 68, iron saturation was 3%, TIBC 443.  Hemoglobin in 2019 was also low at 9.5.  CT in 2019 showing wall thickening involving the sigmoid colon possible underdistention versus sequelae of chronic diverticular disease.  Updated ferritin in January was 6.  Underwent an EGD and colonoscopy last month.  She had a small hiatal hernia, benign gastric polyp removed.  She had 2 benign colon polyps (without adenomatous changes) removed.  Diverticulosis.  No plans for surveillance colonoscopy in the future due to age.  Clinically she feels well.  She is on iron 325 mg twice daily.  Feeling a lot better.  No melena or rectal bleeding.  No constipation or diarrhea.  No upper GI symptoms.  For that she had blood work done after her colonoscopy with her PCP, states her hemoglobin was better.   ASA81mg  daily but no other NSAIDs.    Current Outpatient Medications  Medication Sig Dispense Refill  . amLODipine (NORVASC) 5 MG tablet Take 5 mg by mouth daily.    Marland Kitchen aspirin EC 81 MG tablet Take 81 mg by mouth daily.     Marland Kitchen atorvastatin (LIPITOR) 40 MG tablet Take 40 mg by mouth every evening.    . Dulaglutide (TRULICITY) 0.75 MG/0.5ML SOPN Inject into the skin. Once a week    . ferrous sulfate 325 (65 FE) MG tablet Take 325 mg by mouth 2 (two) times daily with a meal.    . insulin glargine (LANTUS) 100 UNIT/ML injection Inject 26 Units into the skin at bedtime.     Marland Kitchen levothyroxine (SYNTHROID, LEVOTHROID) 100 MCG tablet Take 100 mcg by mouth daily before breakfast.    . metFORMIN (GLUCOPHAGE) 500 MG tablet Take 500 mg by mouth 2 (two) times daily.      . metoprolol succinate (TOPROL-XL) 50 MG 24 hr tablet Take 50 mg by mouth daily. Take with or immediately following a meal.    . telmisartan-hydrochlorothiazide (MICARDIS HCT) 40-12.5 MG per tablet Take 1 tablet by mouth daily.     No current facility-administered medications for this visit.    Allergies as of 05/26/2019  . (No Known Allergies)    ROS:  General: Negative for anorexia, weight loss, fever, chills, fatigue, weakness. ENT: Negative for hoarseness, difficulty swallowing , nasal congestion. CV: Negative for chest pain, angina, palpitations, dyspnea on exertion, peripheral edema.  Respiratory: Negative for dyspnea at rest, dyspnea on exertion, cough, sputum, wheezing.  GI: See history of present illness. GU:  Negative for dysuria, hematuria, urinary incontinence, urinary frequency, nocturnal urination.  Endo: Negative for unusual weight change.    Physical Examination:   BP (!) 151/80   Pulse (!) 113   Temp (!) 96.9 F (36.1 C) (Temporal)   Ht 5\' 4"  (1.626 m)   Wt 127 lb (57.6 kg)   BMI 21.80 kg/m   General: Well-nourished, well-developed in no acute distress.  Eyes: No icterus. Mouth: masked Lungs: Clear to auscultation bilaterally.  Heart: Regular rate and rhythm, no murmurs rubs or gallops.  Abdomen: Bowel sounds are normal, nontender, nondistended, no hepatosplenomegaly or masses, no abdominal bruits  or hernia , no rebound or guarding.   Extremities: No lower extremity edema. No clubbing or deformities. Neuro: Alert and oriented x 4   Skin: Warm and dry, no jaundice.   Psych: Alert and cooperative, normal mood and affect.  Labs:  Lab Results  Component Value Date   CREATININE 0.86 04/22/2019   BUN 18 04/22/2019   NA 136 04/22/2019   K 4.2 04/22/2019   CL 104 04/22/2019   CO2 24 04/22/2019    Lab Results  Component Value Date   WBC 7.7 04/07/2019   HGB 10.1 (L) 04/24/2019   HCT 27.1 (L) 04/07/2019   MCV 67.1 (L) 04/07/2019   PLT 489 (H)  04/07/2019   Lab Results  Component Value Date   FERRITIN 6 (L) 04/07/2019     Imaging Studies: No results found.

## 2019-05-26 NOTE — Assessment & Plan Note (Signed)
Very pleasant 79 year old female with history of iron deficiency anemia, recently completing EGD and colonoscopy without significant findings.  Overall she is feeling less fatigued.  She has been on iron supplements for a couple months.  She has had lab work done about 3 weeks ago stating her hemoglobin was better.  We will request a copy for review.  Consider capsule endoscopy to complete work-up, pending review of labs.

## 2019-05-26 NOTE — Patient Instructions (Signed)
1. We will be in touch with further recommendations once your labs are reviewed.

## 2019-06-23 ENCOUNTER — Telehealth: Payer: Self-pay | Admitting: Gastroenterology

## 2019-06-23 ENCOUNTER — Other Ambulatory Visit: Payer: Self-pay

## 2019-06-23 DIAGNOSIS — D509 Iron deficiency anemia, unspecified: Secondary | ICD-10-CM

## 2019-06-23 NOTE — Telephone Encounter (Signed)
noted 

## 2019-06-23 NOTE — Telephone Encounter (Signed)
Received labs from PCP dated 05/05/2019:  Hemoglobin 9.8, hematocrit 32.3, MCV 70.8 all improved from January.  Would offer small bowel capsule endoscopy to complete work-up for iron deficiency anemia.  If patient not agreeable, recommend continue iron and repeat CBC, ferritin in 3 months.

## 2019-06-23 NOTE — Telephone Encounter (Signed)
Left message with spouse. Waiting on a return call.

## 2019-06-23 NOTE — Telephone Encounter (Signed)
Pt returned call. Pt is aware that labs were received from PCP. Pt would prefer to continue iron and have labs done in 3 months for now. Lab orders placed

## 2019-08-18 ENCOUNTER — Other Ambulatory Visit: Payer: Self-pay | Admitting: *Deleted

## 2019-08-18 ENCOUNTER — Encounter: Payer: Self-pay | Admitting: *Deleted

## 2019-08-18 DIAGNOSIS — D509 Iron deficiency anemia, unspecified: Secondary | ICD-10-CM

## 2019-08-27 ENCOUNTER — Ambulatory Visit (INDEPENDENT_AMBULATORY_CARE_PROVIDER_SITE_OTHER): Payer: Medicare Other | Admitting: Ophthalmology

## 2019-08-27 ENCOUNTER — Encounter (INDEPENDENT_AMBULATORY_CARE_PROVIDER_SITE_OTHER): Payer: Self-pay | Admitting: Ophthalmology

## 2019-08-27 ENCOUNTER — Other Ambulatory Visit: Payer: Self-pay

## 2019-08-27 DIAGNOSIS — E1169 Type 2 diabetes mellitus with other specified complication: Secondary | ICD-10-CM | POA: Insufficient documentation

## 2019-08-27 DIAGNOSIS — H35372 Puckering of macula, left eye: Secondary | ICD-10-CM

## 2019-08-27 DIAGNOSIS — E119 Type 2 diabetes mellitus without complications: Secondary | ICD-10-CM | POA: Diagnosis not present

## 2019-08-27 DIAGNOSIS — H33102 Unspecified retinoschisis, left eye: Secondary | ICD-10-CM | POA: Diagnosis not present

## 2019-08-27 DIAGNOSIS — H43813 Vitreous degeneration, bilateral: Secondary | ICD-10-CM

## 2019-08-27 NOTE — Assessment & Plan Note (Signed)
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  We will continue to observe

## 2019-08-27 NOTE — Progress Notes (Signed)
08/27/2019     CHIEF COMPLAINT Patient presents for Retina Follow Up   HISTORY OF PRESENT ILLNESS: Lisa Beck is a 79 y.o. 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  Severity is moderate.  Duration of 1 year.  Since onset it is stable.  I, the attending physician,  performed the HPI with the patient and updated documentation appropriately.          Comments    1 Year f\u OU. OCT  Pt states vision is about the same. Pt has noticed a slight decrease in OS vision. Pt sees occasional floaters and FOL. BGL: 89 A1C: 7       Last edited by Elyse Jarvis on 08/27/2019  2:02 PM. (History)      Referring physician: Oval Linsey, MD [id number] [place],  [place] [address]  HISTORICAL INFORMATION:   Selected notes from the MEDICAL RECORD NUMBER    Lab Results  Component Value Date   HGBA1C 7.8 (H) 06/16/2014     CURRENT MEDICATIONS: No current outpatient medications on file. (Ophthalmic Drugs)   No current facility-administered medications for this visit. (Ophthalmic Drugs)   Current Outpatient Medications (Other)  Medication Sig  . amLODipine (NORVASC) 5 MG tablet Take 5 mg by mouth daily.  Marland Kitchen aspirin EC 81 MG tablet Take 81 mg by mouth daily.   Marland Kitchen atorvastatin (LIPITOR) 40 MG tablet Take 40 mg by mouth every evening.  . Dulaglutide (TRULICITY) 0.75 MG/0.5ML SOPN Inject into the skin. Once a week  . ferrous sulfate 325 (65 FE) MG tablet Take 325 mg by mouth 2 (two) times daily with a meal.  . insulin glargine (LANTUS) 100 UNIT/ML injection Inject 26 Units into the skin at bedtime.   Marland Kitchen levothyroxine (SYNTHROID, LEVOTHROID) 100 MCG tablet Take 100 mcg by mouth daily before breakfast.  . metFORMIN (GLUCOPHAGE) 500 MG tablet Take 500 mg by mouth 2 (two) times daily.  . metoprolol succinate (TOPROL-XL) 50 MG 24 hr tablet Take 50 mg by mouth daily. Take with or immediately following a meal.  .  telmisartan-hydrochlorothiazide (MICARDIS HCT) 40-12.5 MG per tablet Take 1 tablet by mouth daily.   No current facility-administered medications for this visit. (Other)      REVIEW OF SYSTEMS: ROS    Positive for: Endocrine   Last edited by Elyse Jarvis on 08/27/2019  2:02 PM. (History)       ALLERGIES No Known Allergies  PAST MEDICAL HISTORY Past Medical History:  Diagnosis Date  . Arthritis   . Diabetes mellitus without complication (HCC)   . History of kidney stones   . HOH (hard of hearing)   . Hyperlipidemia   . Hypertension   . Hypothyroidism    Past Surgical History:  Procedure Laterality Date  . COLONOSCOPY  2007   Dr. Karilyn Cota: diverticulosis  . COLONOSCOPY WITH PROPOFOL N/A 04/24/2019   Dr. Jena Gauss: Diverticulosis in the sigmoid colon, two 5 to 9 mm polyps removed from the descending and ascending colon.  Pathology revealed both to be benign polyps with no adenomatous changes. No future surveillance colonoscopies due to age.  . ESOPHAGOGASTRODUODENOSCOPY (EGD) WITH PROPOFOL N/A 04/24/2019   Dr. Jena Gauss: Small hiatal hernia, one gastric polyp benign.  Marland Kitchen POLYPECTOMY  04/24/2019   Procedure: POLYPECTOMY;  Surgeon: Corbin Ade, MD;  Location: [facility];  Service: Endoscopy;;  ascending;  FAMILY HISTORY Family History  Problem Relation Age of Onset  . CAD Father        MI sometime in [place], age unknown  . CAD Brother        Pt has 3 brothers that died of MIs between ages 46s-70s  . CAD Brother   . CAD Brother   . Colon cancer Neg Hx   . Ulcers Neg Hx     SOCIAL HISTORY Social History   Tobacco Use  . Smoking status: Never Smoker  . Smokeless tobacco: Never Used  Vaping Use  . Vaping Use: Never used  Substance Use Topics  . Alcohol use: No  . Drug use: No         OPHTHALMIC EXAM: Base Eye Exam    Visual Acuity (Snellen - Linear)      Right Left   Dist cc 20/20 -1 20/25   Correction: Glasses       Tonometry (Tonopen, 2:06 PM)       Right Left   Pressure 18 17       Pupils      Pupils Dark Light Shape React APD   Right PERRL 4 3 Round Brisk None   Left PERRL 4 3 Round Brisk None       Visual Fields (Counting fingers)      Left Right    Full Full       Neuro/Psych    Oriented x3: Yes   Mood/Affect: Normal       Dilation    Both eyes: 1.0% Mydriacyl, 2.5% Phenylephrine @ 2:06 PM        Slit Lamp and Fundus Exam    Slit Lamp Exam      Right Left   Lens Centered posterior chamber intraocular lens Centered posterior chamber intraocular lens          IMAGING AND PROCEDURES  Imaging and Procedures for 08/27/19           ASSESSMENT/PLAN:  No problem-specific Assessment & Plan notes found for this encounter.      ICD-10-CM   1. 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 Posterior vitreous detachment of both eyes  H43.813     1.  2.  3.  Ophthalmic Meds Ordered this visit:  No orders of the defined types were placed in this encounter.      No follow-ups on file.  There are no Patient Instructions on file for this visit.   Explained the diagnoses, plan, and follow up with the patient and they expressed understanding.  Patient expressed understanding of the importance of proper follow up care.   Clent Demark Maquita Sandoval M.D. Diseases & Surgery of the Retina and Vitreous [facility] [place] 08/27/19     Abbreviations: M myopia (nearsighted); A astigmatism; H hyperopia (farsighted); P presbyopia; Mrx spectacle prescription;  CTL contact lenses; OD right eye; OS left eye; OU both eyes  XT exotropia; ET esotropia; PEK punctate epithelial keratitis; PEE punctate epithelial erosions; DES dry eye syndrome; MGD meibomian gland dysfunction; ATs artificial tears; PFAT's preservative free artificial tears; Tiro nuclear sclerotic cataract; PSC posterior subcapsular cataract; ERM epi-retinal membrane; PVD posterior  vitreous detachment; RD retinal detachment; DM diabetes mellitus; DR diabetic retinopathy; NPDR non-proliferative diabetic retinopathy; PDR proliferative diabetic retinopathy; CSME clinically significant macular edema; DME diabetic macular edema; dbh dot blot hemorrhages; CWS cotton wool spot; POAG primary open  angle glaucoma; C/D cup-to-disc ratio; HVF humphrey visual field; GVF goldmann visual field; OCT optical coherence tomography; IOP intraocular pressure; BRVO Branch retinal vein occlusion; CRVO central retinal vein occlusion; CRAO central retinal artery occlusion; BRAO branch retinal artery occlusion; RT retinal tear; SB scleral buckle; PPV pars plana vitrectomy; VH Vitreous hemorrhage; PRP panretinal laser photocoagulation; IVK intravitreal kenalog; VMT vitreomacular traction; MH Macular hole;  NVD neovascularization of the disc; NVE neovascularization elsewhere; AREDS age related eye disease study; ARMD age related macular degeneration; POAG primary open angle glaucoma; EBMD epithelial/anterior basement membrane dystrophy; ACIOL anterior chamber intraocular lens; IOL intraocular lens; PCIOL posterior chamber intraocular lens; Phaco/IOL phacoemulsification with intraocular lens placement; [place] photorefractive keratectomy; LASIK laser assisted in situ keratomileusis; HTN hypertension; DM diabetes mellitus; COPD chronic obstructive pulmonary disease

## 2019-09-16 ENCOUNTER — Encounter: Payer: Self-pay | Admitting: Ophthalmology

## 2019-09-16 LAB — HM DIABETES EYE EXAM

## 2019-11-22 ENCOUNTER — Telehealth: Payer: Self-pay | Admitting: Gastroenterology

## 2019-11-22 NOTE — Telephone Encounter (Signed)
Reviewed 08/2019 labs: H/H 12.6/37.5, MCV 85.7.   Would advised updated CBC, ferritin in 2 months. Please arrange.

## 2019-11-24 ENCOUNTER — Other Ambulatory Visit: Payer: Self-pay

## 2019-11-24 DIAGNOSIS — D509 Iron deficiency anemia, unspecified: Secondary | ICD-10-CM

## 2019-11-24 NOTE — Telephone Encounter (Signed)
Noted. Lab orders placed for 3 months. Letter will be mailed to pt.

## 2019-12-21 ENCOUNTER — Other Ambulatory Visit: Payer: Self-pay | Admitting: *Deleted

## 2019-12-21 ENCOUNTER — Encounter: Payer: Self-pay | Admitting: *Deleted

## 2019-12-21 DIAGNOSIS — D509 Iron deficiency anemia, unspecified: Secondary | ICD-10-CM

## 2019-12-29 ENCOUNTER — Other Ambulatory Visit (HOSPITAL_COMMUNITY): Payer: Self-pay | Admitting: Family Medicine

## 2019-12-29 ENCOUNTER — Ambulatory Visit (HOSPITAL_COMMUNITY)
Admission: RE | Admit: 2019-12-29 | Discharge: 2019-12-29 | Disposition: A | Discharge status: Home / Self Care | Payer: Medicare Other | Source: Ambulatory Visit | Attending: Family Medicine | Admitting: Family Medicine

## 2019-12-29 ENCOUNTER — Other Ambulatory Visit: Payer: Self-pay

## 2019-12-29 DIAGNOSIS — M25511 Pain in right shoulder: Secondary | ICD-10-CM

## 2019-12-29 LAB — CBC WITH DIFFERENTIAL/PLATELET
Absolute Monocytes: 607 cells/uL (ref 200–950)
Basophils Absolute: 81 cells/uL (ref 0–200)
Basophils Relative: 1.1 %
Eosinophils Absolute: 192 cells/uL (ref 15–500)
Eosinophils Relative: 2.6 %
HCT: 39.3 % (ref 35.0–45.0)
Hemoglobin: 13 g/dL (ref 11.7–15.5)
Lymphs Abs: 2486 cells/uL (ref 850–3900)
MCH: 28.7 pg (ref 27.0–33.0)
MCHC: 33.1 g/dL (ref 32.0–36.0)
MCV: 86.8 fL (ref 80.0–100.0)
MPV: 10 fL (ref 7.5–12.5)
Monocytes Relative: 8.2 %
Neutro Abs: 4033 cells/uL (ref 1500–7800)
Neutrophils Relative %: 54.5 %
Platelets: 338 10*3/uL (ref 140–400)
RBC: 4.53 10*6/uL (ref 3.80–5.10)
RDW: 12.6 % (ref 11.0–15.0)
Total Lymphocyte: 33.6 %
WBC: 7.4 10*3/uL (ref 3.8–10.8)

## 2019-12-29 LAB — FERRITIN: Ferritin: 18 ng/mL (ref 16–288)

## 2020-01-04 ENCOUNTER — Other Ambulatory Visit (HOSPITAL_COMMUNITY): Payer: Self-pay | Admitting: Family Medicine

## 2020-01-04 DIAGNOSIS — Z1231 Encounter for screening mammogram for malignant neoplasm of breast: Secondary | ICD-10-CM

## 2020-01-06 ENCOUNTER — Encounter: Payer: Self-pay | Admitting: Internal Medicine

## 2020-01-07 ENCOUNTER — Other Ambulatory Visit: Payer: Self-pay

## 2020-01-07 DIAGNOSIS — D509 Iron deficiency anemia, unspecified: Secondary | ICD-10-CM

## 2020-01-18 ENCOUNTER — Ambulatory Visit (HOSPITAL_COMMUNITY)
Admission: RE | Admit: 2020-01-18 | Discharge: 2020-01-18 | Disposition: A | Discharge status: Home / Self Care | Payer: Medicare Other | Source: Ambulatory Visit | Attending: Family Medicine | Admitting: Family Medicine

## 2020-01-18 ENCOUNTER — Other Ambulatory Visit: Payer: Self-pay

## 2020-01-18 DIAGNOSIS — Z1231 Encounter for screening mammogram for malignant neoplasm of breast: Secondary | ICD-10-CM | POA: Diagnosis not present

## 2020-04-07 ENCOUNTER — Other Ambulatory Visit: Payer: Self-pay

## 2020-04-07 DIAGNOSIS — D509 Iron deficiency anemia, unspecified: Secondary | ICD-10-CM

## 2020-04-07 NOTE — Progress Notes (Signed)
Labs mailed to the pt. 

## 2020-04-12 LAB — CBC WITH DIFFERENTIAL/PLATELET
Absolute Monocytes: 680 cells/uL (ref 200–950)
Basophils Absolute: 81 cells/uL (ref 0–200)
Basophils Relative: 1 %
Eosinophils Absolute: 89 cells/uL (ref 15–500)
Eosinophils Relative: 1.1 %
HCT: 37.9 % (ref 35.0–45.0)
Hemoglobin: 13.1 g/dL (ref 11.7–15.5)
Lymphs Abs: 2163 cells/uL (ref 850–3900)
MCH: 30.1 pg (ref 27.0–33.0)
MCHC: 34.6 g/dL (ref 32.0–36.0)
MCV: 87.1 fL (ref 80.0–100.0)
MPV: 9.7 fL (ref 7.5–12.5)
Monocytes Relative: 8.4 %
Neutro Abs: 5087 cells/uL (ref 1500–7800)
Neutrophils Relative %: 62.8 %
Platelets: 304 10*3/uL (ref 140–400)
RBC: 4.35 10*6/uL (ref 3.80–5.10)
RDW: 12.4 % (ref 11.0–15.0)
Total Lymphocyte: 26.7 %
WBC: 8.1 10*3/uL (ref 3.8–10.8)

## 2020-04-15 ENCOUNTER — Other Ambulatory Visit: Payer: Self-pay

## 2020-04-15 DIAGNOSIS — D509 Iron deficiency anemia, unspecified: Secondary | ICD-10-CM

## 2020-05-04 LAB — CBC WITH DIFFERENTIAL/PLATELET
Absolute Monocytes: 511 cells/uL (ref 200–950)
Basophils Absolute: 52 cells/uL (ref 0–200)
Basophils Relative: 0.7 %
Eosinophils Absolute: 104 cells/uL (ref 15–500)
Eosinophils Relative: 1.4 %
HCT: 37.8 % (ref 35.0–45.0)
Hemoglobin: 12.5 g/dL (ref 11.7–15.5)
Lymphs Abs: 2301 cells/uL (ref 850–3900)
MCH: 28.7 pg (ref 27.0–33.0)
MCHC: 33.1 g/dL (ref 32.0–36.0)
MCV: 86.9 fL (ref 80.0–100.0)
MPV: 10 fL (ref 7.5–12.5)
Monocytes Relative: 6.9 %
Neutro Abs: 4433 cells/uL (ref 1500–7800)
Neutrophils Relative %: 59.9 %
Platelets: 328 10*3/uL (ref 140–400)
RBC: 4.35 10*6/uL (ref 3.80–5.10)
RDW: 12.1 % (ref 11.0–15.0)
Total Lymphocyte: 31.1 %
WBC: 7.4 10*3/uL (ref 3.8–10.8)

## 2020-05-04 LAB — IRON,TIBC AND FERRITIN PANEL
%SAT: 20 % (calc) (ref 16–45)
Ferritin: 12 ng/mL — ABNORMAL LOW (ref 16–288)
Iron: 68 ug/dL (ref 45–160)
TIBC: 347 mcg/dL (calc) (ref 250–450)

## 2020-05-09 ENCOUNTER — Other Ambulatory Visit: Payer: Self-pay

## 2020-05-09 ENCOUNTER — Encounter: Payer: Self-pay | Admitting: Gastroenterology

## 2020-05-09 ENCOUNTER — Ambulatory Visit (INDEPENDENT_AMBULATORY_CARE_PROVIDER_SITE_OTHER): Payer: Medicare Other | Admitting: Gastroenterology

## 2020-05-09 VITALS — BP 147/80 | HR 85 | Temp 96.9°F | Ht 64.0 in | Wt 129.6 lb

## 2020-05-09 DIAGNOSIS — D509 Iron deficiency anemia, unspecified: Secondary | ICD-10-CM

## 2020-05-09 MED ORDER — FERROUS SULFATE 324 (65 FE) MG PO TBEC: 324.0000 mg | DELAYED_RELEASE_TABLET | Freq: Two times a day (BID) | ORAL | 5 refills | Status: DC

## 2020-05-09 NOTE — Patient Instructions (Signed)
1. Restart ferrous sulfate 324 mg twice daily with food.  I sent prescription to your pharmacy although they may require you to get over-the-counter.   2. Try taking iron supplement and dietary iron with a food rich in vitamin C to help with absorption, see below.  3. Increase dietary iron, see below. 4. We will recheck your labs in 07/2020. If you still appear to have low iron stores or hemoglobin drops, then we will need you to complete the capsule/camera test we discussed today. 5. Call if you feel tired, see black or bloody stools.   Vitamin C rich foods include: bell peppers, strawberries, oranges, broccoli, tomatoes, grapefruit, leafy greens, potatoes.   Iron-Rich Diet  Iron is a mineral that helps your body to produce hemoglobin. Hemoglobin is a protein in red blood cells that carries oxygen to your body's tissues. Eating too little iron may cause you to feel weak and tired, and it can increase your risk of infection. Iron is naturally found in many foods, and many foods have iron added to them (iron-fortified foods). You may need to follow an iron-rich diet if you do not have enough iron in your body due to certain medical conditions. The amount of iron that you need each day depends on your age, your sex, and any medical conditions you have. Follow instructions from your health care provider or a diet and nutrition specialist (dietitian) about how much iron you should eat each day. What are tips for following this plan? Reading food labels  Check food labels to see how many milligrams (mg) of iron are in each serving. Cooking  Cook foods in pots and pans that are made from iron.  Take these steps to make it easier for your body to absorb iron from certain foods: ? Soak beans overnight before cooking. ? Soak whole grains overnight and drain them before using. ? Ferment flours before baking, such as by using yeast in bread dough. Meal planning  When you eat foods that contain iron,  you should eat them with foods that are high in vitamin C. These include oranges, peppers, tomatoes, potatoes, and mango. Vitamin C helps your body to absorb iron. General information  Take iron supplements only as told by your health care provider. An overdose of iron can be life-threatening. If you were prescribed iron supplements, take them with orange juice or a vitamin C supplement.  When you eat iron-fortified foods or take an iron supplement, you should also eat foods that naturally contain iron, such as meat, poultry, and fish. Eating naturally iron-rich foods helps your body to absorb the iron that is added to other foods or contained in a supplement.  Certain foods and drinks prevent your body from absorbing iron properly. Avoid eating these foods in the same meal as iron-rich foods or with iron supplements. These foods include: ? Coffee, black tea, and red wine. ? Milk, dairy products, and foods that are high in calcium. ? Beans and soybeans. ? Whole grains. What foods should I eat? Fruits Prunes. Raisins. Eat fruits high in vitamin C, such as oranges, grapefruits, and strawberries, alongside iron-rich foods. Vegetables Spinach (cooked). Green peas. Broccoli. Fermented vegetables. Eat vegetables high in vitamin C, such as leafy greens, potatoes, bell peppers, and tomatoes, alongside iron-rich foods. Grains Iron-fortified breakfast cereal. Iron-fortified whole-wheat bread. Enriched rice. Sprouted grains. Meats and other proteins Beef liver. Oysters. Beef. Shrimp. [place]. Chicken. Tuna. Sardines. Chickpeas. Nuts. Tofu. Pumpkin seeds. Beverages Tomato juice. Fresh orange juice. Prune  juice. Hibiscus tea. Fortified instant breakfast shakes. Sweets and desserts Blackstrap molasses. Seasonings and condiments Tahini. Fermented soy sauce. Other foods Wheat germ. The items listed above may not be a complete list of recommended foods and beverages. Contact a dietitian for more  information. What foods should I avoid? Grains Whole grains. Bran cereal. Bran flour. Oats. Meats and other proteins Soybeans. Products made from soy protein. Black beans. Lentils. Mung beans. Split peas. Dairy Milk. Cream. Cheese. Yogurt. Cottage cheese. Beverages Coffee. Black tea. Red wine. Sweets and desserts Cocoa. Chocolate. Ice cream. Other foods Basil. Oregano. Large amounts of parsley. The items listed above may not be a complete list of foods and beverages to avoid. Contact a dietitian for more information. Summary  Iron is a mineral that helps your body to produce hemoglobin. Hemoglobin is a protein in red blood cells that carries oxygen to your body's tissues.  Iron is naturally found in many foods, and many foods have iron added to them (iron-fortified foods).  When you eat foods that contain iron, you should eat them with foods that are high in vitamin C. Vitamin C helps your body to absorb iron.  Certain foods and drinks prevent your body from absorbing iron properly, such as whole grains and dairy products. You should avoid eating these foods in the same meal as iron-rich foods or with iron supplements. This information is not intended to replace advice given to you by your health care provider. Make sure you discuss any questions you have with your health care provider. Document Revised: 02/15/2017 Document Reviewed: 01/29/2017 Elsevier Patient Education  2021 [employer].

## 2020-05-09 NOTE — Progress Notes (Signed)
Primary Care Physician: Oval Linsey, MD  Primary Gastroenterologist:  Roetta Sessions, MD   Chief Complaint  Patient presents with  . Anemia    Doing ok    HPI: Lisa Beck is a 80 y.o. female here for follow-up of IDA.  Last seen in March.  Completed EGD and colonoscopy in February 2021.She had a small hiatal hernia, benign gastric polyp removed.  She had 2 benign colon polyps (without adenomatous changes) removed. Diverticulosis.  No plans for surveillance colonoscopy in the future due to age.  We have been following her labs over the last year.  She opted to continue surveillance labs and hold off on capsule endoscopy.  Labs from October revealed a hemoglobin of 13 up from 10.1, 8 months prior.  Ferritin was 18 up from 6.  Repeat labs last week, ferritin slightly lower at 12, iron normal at 68, TIBC 347, hemoglobin 12.5, hematocrit 37.8.  Patient takes aspirin 81 mg daily but no NSAIDs.  Stopped iron one month ago per our instructions, when her Hgb was 13 on our labs. We had to repeat labs to get iron/ferritin (see above values). Feels good. Energy good. No abdominal pain, no n/v. BMs regular. No melena, brbpr. Weight stable.      Current Outpatient Medications  Medication Sig Dispense Refill  . amLODipine (NORVASC) 5 MG tablet Take 5 mg by mouth daily.    Marland Kitchen aspirin EC 81 MG tablet Take 81 mg by mouth daily.     Marland Kitchen atorvastatin (LIPITOR) 40 MG tablet Take 40 mg by mouth every evening.    . Dulaglutide (TRULICITY) 0.75 MG/0.5ML SOPN Inject into the skin. Once a week    . insulin glargine (LANTUS) 100 UNIT/ML injection Inject 26 Units into the skin at bedtime.     Marland Kitchen levothyroxine (SYNTHROID, LEVOTHROID) 100 MCG tablet Take 100 mcg by mouth daily before breakfast.    . metFORMIN (GLUCOPHAGE) 500 MG tablet Take 500 mg by mouth 2 (two) times daily.    . metoprolol succinate (TOPROL-XL) 50 MG 24 hr tablet Take 50 mg by mouth daily. Take with or immediately following a  meal.    . telmisartan-hydrochlorothiazide (MICARDIS HCT) 40-12.5 MG per tablet Take 1 tablet by mouth daily.    Marland Kitchen VITAMIN D PO Take by mouth daily.     No current facility-administered medications for this visit.    Allergies as of 05/09/2020  . (No Known Allergies)    ROS:  General: Negative for anorexia, weight loss, fever, chills, fatigue, weakness. ENT: Negative for hoarseness, difficulty swallowing , nasal congestion. CV: Negative for chest pain, angina, palpitations, dyspnea on exertion, peripheral edema.  Respiratory: Negative for dyspnea at rest, dyspnea on exertion, cough, sputum, wheezing.  GI: See history of present illness. GU:  Negative for dysuria, hematuria, urinary incontinence, urinary frequency, nocturnal urination.  Endo: Negative for unusual weight change.    Physical Examination:   BP (!) 147/80   Pulse 85   Temp (!) 96.9 F (36.1 C) (Temporal)   Ht 5\' 4"  (1.626 m)   Wt 129 lb 9.6 oz (58.8 kg)   BMI 22.25 kg/m   General: Well-nourished, well-developed in no acute distress.  Eyes: No icterus. Mouth: masked Lungs: Clear to auscultation bilaterally.  Heart: Regular rate and rhythm, no murmurs rubs or gallops.  Abdomen: Bowel sounds are normal, nontender, nondistended, no hepatosplenomegaly or masses, no abdominal bruits or hernia , no rebound or guarding.   Extremities: No lower extremity  edema. No clubbing or deformities. Neuro: Alert and oriented x 4   Skin: Warm and dry, no jaundice.   Psych: Alert and cooperative, normal mood and affect.  Labs:    Lab Results  Component Value Date   WBC 7.4 05/03/2020   HGB 12.5 05/03/2020   HCT 37.8 05/03/2020   MCV 86.9 05/03/2020   PLT 328 05/03/2020   Lab Results  Component Value Date   IRON 68 05/03/2020   TIBC 347 05/03/2020   FERRITIN 12 (L) 05/03/2020     Imaging Studies: No results found.  Assessment/plan:  Pleasant 80 year old female with history of IDA presenting for follow-up.  EGD  and colonoscopy completed last year as outlined above.  Hemoglobin has greatly improved, now normalized.  Iron stores remain low.  Has been on iron consistently up until 1 month ago when we told her she could stop it.  Mutually decided with the patient previously to hold off on capsule study and follow labs.  She would like to continue this plan for now.  She will restart ferrous sulfate 324 mg twice daily with food.  She will take her iron supplement along with a food rich in vitamin C to increase absorption.  Increase vitamin C rich foods along with iron rich foods as well.  We will update her labs in May.  If her ferritin has not significantly improved or her hemoglobin declines, then we would proceed with capsule endoscopy to complete GI work-up.  Patient in agreement.  In the interim if she sees any blood in the stool, melena, feels fatigued she will let us know sooner.

## 2020-05-09 NOTE — Progress Notes (Signed)
Cc'ed to pcp °

## 2020-06-28 ENCOUNTER — Other Ambulatory Visit: Payer: Self-pay

## 2020-06-28 DIAGNOSIS — D509 Iron deficiency anemia, unspecified: Secondary | ICD-10-CM

## 2020-08-05 LAB — CBC WITH DIFFERENTIAL/PLATELET
Absolute Monocytes: 671 cells/uL (ref 200–950)
Basophils Absolute: 77 cells/uL (ref 0–200)
Basophils Relative: 0.9 %
Eosinophils Absolute: 292 cells/uL (ref 15–500)
Eosinophils Relative: 3.4 %
HCT: 38.5 % (ref 35.0–45.0)
Hemoglobin: 12.7 g/dL (ref 11.7–15.5)
Lymphs Abs: 2795 cells/uL (ref 850–3900)
MCH: 28.5 pg (ref 27.0–33.0)
MCHC: 33 g/dL (ref 32.0–36.0)
MCV: 86.5 fL (ref 80.0–100.0)
MPV: 9.4 fL (ref 7.5–12.5)
Monocytes Relative: 7.8 %
Neutro Abs: 4764 cells/uL (ref 1500–7800)
Neutrophils Relative %: 55.4 %
Platelets: 351 10*3/uL (ref 140–400)
RBC: 4.45 10*6/uL (ref 3.80–5.10)
RDW: 13.4 % (ref 11.0–15.0)
Total Lymphocyte: 32.5 %
WBC: 8.6 10*3/uL (ref 3.8–10.8)

## 2020-08-05 LAB — IRON,TIBC AND FERRITIN PANEL
%SAT: 25 % (calc) (ref 16–45)
Ferritin: 37 ng/mL (ref 16–288)
Iron: 84 ug/dL (ref 45–160)
TIBC: 332 mcg/dL (calc) (ref 250–450)

## 2020-08-19 ENCOUNTER — Other Ambulatory Visit: Payer: Self-pay | Admitting: *Deleted

## 2020-08-19 DIAGNOSIS — D509 Iron deficiency anemia, unspecified: Secondary | ICD-10-CM

## 2020-08-29 ENCOUNTER — Encounter (INDEPENDENT_AMBULATORY_CARE_PROVIDER_SITE_OTHER): Payer: Self-pay | Admitting: Ophthalmology

## 2020-08-29 ENCOUNTER — Ambulatory Visit (INDEPENDENT_AMBULATORY_CARE_PROVIDER_SITE_OTHER): Payer: Medicare Other | Admitting: Ophthalmology

## 2020-08-29 ENCOUNTER — Other Ambulatory Visit: Payer: Self-pay

## 2020-08-29 DIAGNOSIS — H33102 Unspecified retinoschisis, left eye: Secondary | ICD-10-CM | POA: Diagnosis not present

## 2020-08-29 DIAGNOSIS — H35372 Puckering of macula, left eye: Secondary | ICD-10-CM | POA: Diagnosis not present

## 2020-08-29 DIAGNOSIS — E119 Type 2 diabetes mellitus without complications: Secondary | ICD-10-CM | POA: Diagnosis not present

## 2020-08-29 NOTE — Progress Notes (Signed)
08/29/2020     CHIEF COMPLAINT Patient presents for Retina Follow Up (1 Year F/U OU//Pt denies noticeable changes to [place] OU since last visit. Pt denies ocular pain, flashes of light, or floaters OU. //)   HISTORY OF PRESENT ILLNESS: Lisa Beck is a 80 y.o. female who presents to the clinic today for:   HPI     Retina Follow Up           Diagnosis: Other   Laterality: both eyes   Onset: 1 year ago   Severity: mild   Duration: 1 year   Course: stable   Comments: 1 Year F/U OU  Pt denies noticeable changes to [place] OU since last visit. Pt denies ocular pain, flashes of light, or floaters OU.          Last edited by Ileana Roup, COA on 08/29/2020 12:58 PM.      Referring physician: Oval Linsey, MD [address] [place],  [place] [address]  HISTORICAL INFORMATION:   Selected notes from the MEDICAL RECORD NUMBER    Lab Results  Component Value Date   HGBA1C 7.8 (H) 06/16/2014     CURRENT MEDICATIONS: No current outpatient medications on file. (Ophthalmic Drugs)   No current facility-administered medications for this visit. (Ophthalmic Drugs)   Current Outpatient Medications (Other)  Medication Sig   amLODipine (NORVASC) 5 MG tablet Take 5 mg by mouth daily.   aspirin EC 81 MG tablet Take 81 mg by mouth daily.    atorvastatin (LIPITOR) 40 MG tablet Take 40 mg by mouth every evening.   Dulaglutide (TRULICITY) 0.75 MG/0.5ML SOPN Inject into the skin. Once a week   ferrous sulfate 324 (65 Fe) MG TBEC Take 1 tablet (324 mg total) by mouth 2 (two) times daily with a meal.   insulin glargine (LANTUS) 100 UNIT/ML injection Inject 26 Units into the skin at bedtime.    levothyroxine (SYNTHROID, LEVOTHROID) 100 MCG tablet Take 100 mcg by mouth daily before breakfast.   metFORMIN (GLUCOPHAGE) 500 MG tablet Take 500 mg by mouth 2 (two) times daily.   metoprolol succinate (TOPROL-XL) 50 MG 24 hr tablet Take 50 mg by mouth daily. Take with or immediately following  a meal.   telmisartan-hydrochlorothiazide (MICARDIS HCT) 40-12.5 MG per tablet Take 1 tablet by mouth daily.   VITAMIN D PO Take by mouth daily.   No current facility-administered medications for this visit. (Other)      REVIEW OF SYSTEMS:    ALLERGIES No Known Allergies  PAST MEDICAL HISTORY Past Medical History:  Diagnosis Date   Arthritis    Diabetes mellitus without complication (HCC)    History of kidney stones    HOH (hard of hearing)    Hyperlipidemia    Hypertension    Hypothyroidism    Past Surgical History:  Procedure Laterality Date   COLONOSCOPY  2007   Dr. Karilyn Cota: diverticulosis   COLONOSCOPY WITH PROPOFOL N/A 04/24/2019   Dr. Jena Gauss: Diverticulosis in the sigmoid colon, two 5 to 9 mm polyps removed from the descending and ascending colon.  Pathology revealed both to be benign polyps with no adenomatous changes. No future surveillance colonoscopies due to age.   ESOPHAGOGASTRODUODENOSCOPY (EGD) WITH PROPOFOL N/A 04/24/2019   Dr. Jena Gauss: Small hiatal hernia, one gastric polyp benign.   POLYPECTOMY  04/24/2019   Procedure: POLYPECTOMY;  Surgeon: Corbin Ade, MD;  [facility]: [facility];  Service: Endoscopy;;  ascending;    FAMILY HISTORY Family History  Problem Relation Age of Onset   CAD Father        MI sometime in 23, age unknown   CAD Brother        Pt has 3 brothers that died of MIs between ages 44s-70s   CAD Brother    CAD Brother    Colon cancer Neg Hx    Ulcers Neg Hx     SOCIAL HISTORY Social History   Tobacco Use   Smoking status: Never   Smokeless tobacco: Never  Vaping Use   Vaping Use: Never used  Substance Use Topics   Alcohol use: No   Drug use: No         OPHTHALMIC EXAM:  Base Eye Exam     Visual Acuity (ETDRS)       Right Left   Dist cc 20/20 20/20 -1    Correction: Glasses         Tonometry (Tonopen, 1:01 PM)       Right Left   Pressure 14 17         Pupils       Pupils Dark Light Shape  React APD   Right PERRL 6 5 Round Brisk None   Left PERRL 6 5 Round Brisk None         Visual Fields (Counting fingers)       Left Right    Full Full         Extraocular Movement       Right Left    Full Full         Neuro/Psych     Oriented x3: Yes   Mood/Affect: Normal         Dilation     Both eyes: 1.0% Mydriacyl, 2.5% Phenylephrine @ 1:01 PM           Slit Lamp and Fundus Exam     External Exam       Right Left   External Normal Normal         Slit Lamp Exam       Right Left   Lids/Lashes Normal Normal   Conjunctiva/Sclera White and quiet White and quiet   Cornea Clear Clear   Anterior Chamber Deep and quiet Deep and quiet   Iris Round and reactive Round and reactive   Lens Centered posterior chamber intraocular lens, Open posterior capsule Centered posterior chamber intraocular lens, Open posterior capsule   Anterior Vitreous Normal Normal         Fundus Exam       Right Left   Posterior Vitreous Posterior vitreous detachment Posterior vitreous detachment   Disc Normal Normal   C/D Ratio 0.3 0.4   Macula Normal Epiretinal membrane, moderate topographic distortion   Vessels no DR no DR   Periphery Normal Normal            IMAGING AND PROCEDURES  Imaging and Procedures for 08/29/20  OCT, Retina - OU - Both Eyes       Right Eye Quality was good. Scan locations included subfoveal. Central Foveal Thickness: 308. Progression has been stable. Findings include normal foveal contour.   Left Eye Quality was good. Scan locations included subfoveal. Central Foveal Thickness: 367. Progression has been stable. Findings include abnormal foveal contour, epiretinal membrane.   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 Macular pucker, left eye  H35.372 OCT, Retina - OU - Both Eyes    2. Diabetes mellitus without complication (HCC)  E11.9     3. 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  Stable acuity, OU.  2.  Epiretinal membrane left eye with no impact on acuity observe  3.  No Detectable retinopathy in either  Ophthalmic Meds Ordered this visit:  No orders of the defined types were placed in this encounter.      Return in about 1 year (around 08/29/2021) for DILATE OU, OCT.  There are no Patient Instructions on file for this visit.   Explained the diagnoses, plan, and follow up with the patient and they expressed understanding.  Patient expressed understanding of the importance of proper follow up care.   Alford Highland Naaman Curro M.D. Diseases & Surgery of the Retina and Vitreous [facility] 08/29/20     Abbreviations: M myopia (nearsighted); A astigmatism; H hyperopia (farsighted); P presbyopia; Mrx spectacle prescription;  CTL contact lenses; OD right eye; OS left eye; OU both eyes  XT exotropia; ET esotropia; PEK punctate epithelial keratitis; PEE punctate epithelial erosions; DES dry eye syndrome; MGD meibomian gland dysfunction; ATs artificial tears; PFAT's preservative free artificial tears; NSC nuclear sclerotic cataract; PSC posterior subcapsular cataract; ERM epi-retinal membrane; PVD posterior vitreous detachment; RD retinal detachment; DM diabetes mellitus; DR diabetic retinopathy; NPDR non-proliferative diabetic retinopathy; PDR proliferative diabetic retinopathy; CSME clinically significant macular edema; DME diabetic macular edema; dbh dot blot hemorrhages; CWS cotton wool spot; POAG primary open angle glaucoma; C/D cup-to-disc ratio; HVF humphrey visual field; GVF goldmann visual field; OCT optical coherence tomography; IOP intraocular pressure; BRVO Branch retinal  vein occlusion; CRVO central retinal vein occlusion; CRAO central retinal artery occlusion; BRAO branch retinal artery occlusion; RT retinal tear; SB scleral buckle; PPV pars plana vitrectomy; VH Vitreous hemorrhage; PRP panretinal laser photocoagulation; IVK intravitreal kenalog; VMT vitreomacular traction; MH Macular hole;  NVD neovascularization of the disc; NVE neovascularization elsewhere; AREDS age related eye disease study; ARMD age related macular degeneration; POAG primary open angle glaucoma; EBMD epithelial/anterior basement membrane dystrophy; ACIOL anterior chamber intraocular lens; IOL intraocular lens; PCIOL posterior chamber intraocular lens; Phaco/IOL phacoemulsification with intraocular lens placement; PRK photorefractive keratectomy; LASIK laser assisted in situ keratomileusis; HTN hypertension; DM diabetes mellitus; COPD chronic obstructive pulmonary disease

## 2020-08-29 NOTE — Assessment & Plan Note (Signed)
No DR OU 

## 2020-08-29 NOTE — Assessment & Plan Note (Signed)
Minor, no change over the last year observe

## 2020-08-29 NOTE — Assessment & Plan Note (Signed)
Component of epiretinal membrane, not CME

## 2020-09-21 ENCOUNTER — Ambulatory Visit: Payer: Medicare Other | Admitting: Internal Medicine

## 2020-10-19 ENCOUNTER — Ambulatory Visit (INDEPENDENT_AMBULATORY_CARE_PROVIDER_SITE_OTHER): Payer: Medicare Other | Admitting: Internal Medicine

## 2020-10-19 ENCOUNTER — Encounter: Payer: Self-pay | Admitting: Internal Medicine

## 2020-10-19 ENCOUNTER — Other Ambulatory Visit: Payer: Self-pay

## 2020-10-19 VITALS — BP 128/76 | HR 89 | Temp 97.9°F | Resp 18 | Ht 64.5 in | Wt 128.0 lb

## 2020-10-19 DIAGNOSIS — Z7689 Persons encountering health services in other specified circumstances: Secondary | ICD-10-CM

## 2020-10-19 DIAGNOSIS — I10 Essential (primary) hypertension: Secondary | ICD-10-CM

## 2020-10-19 DIAGNOSIS — E782 Mixed hyperlipidemia: Secondary | ICD-10-CM

## 2020-10-19 DIAGNOSIS — M19011 Primary osteoarthritis, right shoulder: Secondary | ICD-10-CM

## 2020-10-19 DIAGNOSIS — Z780 Asymptomatic menopausal state: Secondary | ICD-10-CM

## 2020-10-19 DIAGNOSIS — E119 Type 2 diabetes mellitus without complications: Secondary | ICD-10-CM

## 2020-10-19 DIAGNOSIS — E785 Hyperlipidemia, unspecified: Secondary | ICD-10-CM | POA: Insufficient documentation

## 2020-10-19 DIAGNOSIS — E039 Hypothyroidism, unspecified: Secondary | ICD-10-CM

## 2020-10-19 NOTE — Assessment & Plan Note (Addendum)
On statin Check lipid panel

## 2020-10-19 NOTE — Assessment & Plan Note (Signed)
On Lantus 26 U qHS, Trulicity 0.75 mg qw and Metformim 500 mg BID Advised to follow diabetic diet On ARB and statin F/u CMP and lipid panel Diabetic foot exam: Today Diabetic eye exam: Advised to follow up with Ophthalmology for diabetic eye exam

## 2020-10-19 NOTE — Assessment & Plan Note (Signed)
Care established History and medications reviewed with the patient 

## 2020-10-19 NOTE — Assessment & Plan Note (Signed)
Lab Results  Component Value Date   TSH 1.731 06/15/2014   On Levothyroxine 100 mcg QD Check TSH and free T4

## 2020-10-19 NOTE — Assessment & Plan Note (Signed)
BP Readings from Last 1 Encounters:  10/19/20 128/76   Well-controlled with Amlodipine, Micardis HCT and Metoprolol Counseled for compliance with the medications Advised DASH diet and moderate exercise/walking as tolerated

## 2020-10-19 NOTE — Patient Instructions (Signed)
Please take Tylenol arthritis up to 3 times in a day for joint pain.  Apply heating pad or ice to help with pain or swelling in the shoulder area.  Please continue to take medications as prescribed.  Please continue to follow low carb and low salt diet and ambulate as tolerated.

## 2020-10-19 NOTE — Progress Notes (Addendum)
New Patient Office Visit  Subjective:  Patient ID: Lisa Beck, female    DOB: [birth year]  Age: 80 y.o. MRN: [id number]  CC:  Chief Complaint  Patient presents with   New Patient (Initial Visit)    New patient was seeing dr Cindie Laroche. Pt having right shoulder pain for months now was given meloxicam however this doesn't seem to be helping     HPI Lisa Beck is a 80 year old female with past medical history of hypertension, DM, hypothyroidism, HLD and macular pucker of left eye who presents for establishing care.  HTN: BP is well-controlled. Takes medications regularly. Patient denies headache, dizziness, chest pain, dyspnea or palpitations.  DM: She has been taking Lantus, Trulicity and metformin regularly.  Her blood glucose stays below 100 in the morning.  She denies any dizziness, polyuria or polyphagia.  Hypothyroidism: She takes levothyroxine regularly.  Denies any recent change in weight or appetite.  Denies any recent skin or hair changes.  She complains of right shoulder pain, which is chronic and is worse with activity.  She has had x-ray of the right shoulder, which showed chronic degenerative changes.  She takes meloxicam for it, which has not helped much.  She states that heating pad has been helping her.  She is up-to-date with COVID-vaccine.  Past Medical History:  Diagnosis Date   Arthritis    Diabetes mellitus without complication ([place])    History of kidney stones    HOH (hard of hearing)    Hyperlipidemia    Hypertension    Hypothyroidism     Past Surgical History:  Procedure Laterality Date   COLONOSCOPY  2007   Dr. Laural Golden: diverticulosis   COLONOSCOPY WITH PROPOFOL N/A 04/24/2019   Dr. Gala Romney: Diverticulosis in the sigmoid colon, two 5 to 9 mm polyps removed from the descending and ascending colon.  Pathology revealed both to be benign polyps with no adenomatous changes. No future surveillance colonoscopies due to age.   ESOPHAGOGASTRODUODENOSCOPY  (EGD) WITH PROPOFOL N/A 04/24/2019   Dr. Gala Romney: Small hiatal hernia, one gastric polyp benign.   POLYPECTOMY  04/24/2019   Procedure: POLYPECTOMY;  Surgeon: Daneil Dolin, MD;  [facility]: [facility];  Service: Endoscopy;;  ascending;    Family History  Problem Relation Age of Onset   CAD Father        MI sometime in 73, age unknown   CAD Brother        Pt has 3 brothers that died of MIs between ages 4s-70s   CAD Brother    CAD Brother    Colon cancer Neg Hx    Ulcers Neg Hx     Social History   Socioeconomic History   Marital status: Married    Spouse name: Not on file   Number of children: Not on file   Years of education: Not on file   Highest education level: Not on file  Occupational History   Not on file  Tobacco Use   Smoking status: Never   Smokeless tobacco: Never  Vaping Use   Vaping Use: Never used  Substance and Sexual Activity   Alcohol use: No   Drug use: No   Sexual activity: Not Currently  Other Topics Concern   Not on file  Social History Narrative   Not on file   Social Determinants of Health   Financial Resource Strain: Not on file  Food Insecurity: Not on file  Transportation Needs: Not on file  Physical Activity: Not  on file  Stress: Not on file  Social Connections: Not on file  Intimate Partner Violence: Not on file    ROS Review of Systems  Constitutional:  Negative for chills and fever.  HENT:  Negative for congestion, sinus pressure, sinus pain and sore throat.   Eyes:  Negative for pain and discharge.  Respiratory:  Negative for cough and shortness of breath.   Cardiovascular:  Negative for chest pain and palpitations.  Gastrointestinal:  Negative for abdominal pain, constipation, diarrhea, nausea and vomiting.  Endocrine: Negative for polydipsia and polyuria.  Genitourinary:  Negative for dysuria and hematuria.  Musculoskeletal:  Positive for arthralgias. Negative for neck pain and neck stiffness.  Skin:  Negative for  rash.  Neurological:  Negative for dizziness and weakness.  Psychiatric/Behavioral:  Negative for agitation and behavioral problems.    Objective:   Today's Vitals: BP 128/76 (BP Location: Left Arm, Cuff Size: Normal)   Pulse 89   Temp 97.9 F (36.6 C) (Oral)   Resp 18   Ht 5' 4.5" (1.638 m)   Wt 128 lb (58.1 kg)   SpO2 97%   BMI 21.63 kg/m   Physical Exam Vitals reviewed.  Constitutional:      General: She is not in acute distress.    Appearance: She is not diaphoretic.  HENT:     Head: Normocephalic and atraumatic.     Nose: Nose normal.     Mouth/Throat:     Mouth: Mucous membranes are moist.  Eyes:     General: No scleral icterus.    Extraocular Movements: Extraocular movements intact.  Cardiovascular:     Rate and Rhythm: Normal rate and regular rhythm.     Pulses: Normal pulses.     Heart sounds: Normal heart sounds. No murmur heard. Pulmonary:     Breath sounds: Normal breath sounds. No wheezing or rales.  Abdominal:     Palpations: Abdomen is soft.     Tenderness: There is no abdominal tenderness.  Musculoskeletal:     Cervical back: Neck supple. No tenderness.     Right lower leg: No edema.     Left lower leg: No edema.  Skin:    General: Skin is warm.     Findings: No rash.  Neurological:     General: No focal deficit present.     Mental Status: She is alert and oriented to person, place, and time.  Psychiatric:        Mood and Affect: Mood normal.        Behavior: Behavior normal.    Assessment & Plan:   Problem List Items Addressed This Visit       Encounter to establish care - Primary   Care established History and medications reviewed with the patient        Cardiovascular and Mediastinum   HTN (hypertension)    BP Readings from Last 1 Encounters:  10/19/20 128/76  Well-controlled with Amlodipine, Micardis HCT and Metoprolol Counseled for compliance with the medications Advised DASH diet and moderate exercise/walking as  tolerated      Relevant Orders   CBC with Differential/Platelet   CMP14+EGFR     Endocrine   Diabetes mellitus without complication (HCC)    On Lantus 26 U qHS, Trulicity 2.83 mg qw and Metformim 500 mg BID Advised to follow diabetic diet On ARB and statin F/u CMP and lipid panel Diabetic foot exam: Today Diabetic eye exam: Advised to follow up with Ophthalmology for diabetic eye exam  Relevant Orders   CMP14+EGFR   Lipid Profile   HgB A1c   Hypothyroidism    Lab Results  Component Value Date   TSH 1.731 06/15/2014  On Levothyroxine 100 mcg QD Check TSH and free T4      Relevant Orders   TSH + free T4     Musculoskeletal and Integument   Primary osteoarthritis of right shoulder    DC meloxicam Tylenol as needed He tolerated arise Referred to orthopedic surgery       Relevant Orders   Ambulatory referral to Orthopedic Surgery     Other   HLD (hyperlipidemia)    On statin Check lipid panel       Relevant Orders   Lipid Profile         Other Visit Diagnoses     Post-menopausal       Relevant Orders   DG Bone Density       Outpatient Encounter Medications as of 10/19/2020  Medication Sig   amLODipine (NORVASC) 5 MG tablet Take 5 mg by mouth daily.   aspirin EC 81 MG tablet Take 81 mg by mouth daily.    atorvastatin (LIPITOR) 40 MG tablet Take 40 mg by mouth every evening.   Dulaglutide (TRULICITY) 3.38 VA/9.1BT SOPN Inject into the skin. Once a week   ferrous sulfate 324 (65 Fe) MG TBEC Take 1 tablet (324 mg total) by mouth 2 (two) times daily with a meal.   insulin glargine (LANTUS) 100 UNIT/ML injection Inject 26 Units into the skin at bedtime.    levothyroxine (SYNTHROID, LEVOTHROID) 100 MCG tablet Take 100 mcg by mouth daily before breakfast.   metFORMIN (GLUCOPHAGE) 500 MG tablet Take 500 mg by mouth 2 (two) times daily.   metoprolol succinate (TOPROL-XL) 50 MG 24 hr tablet Take 50 mg by mouth daily. Take with or immediately following a  meal.   telmisartan-hydrochlorothiazide (MICARDIS HCT) 40-12.5 MG per tablet Take 1 tablet by mouth daily.   VITAMIN D PO Take by mouth daily.   [DISCONTINUED] meloxicam (MOBIC) 15 MG tablet    No facility-administered encounter medications on file as of 10/19/2020.    Follow-up: Return in about 4 months (around 02/18/2021) for HTN, DM and hypothyroidism.   Lindell Spar, MD

## 2020-10-19 NOTE — Assessment & Plan Note (Signed)
DC meloxicam Tylenol as needed He tolerated arise Referred to orthopedic surgery

## 2020-10-21 ENCOUNTER — Other Ambulatory Visit: Payer: Self-pay | Admitting: Internal Medicine

## 2020-10-21 DIAGNOSIS — E039 Hypothyroidism, unspecified: Secondary | ICD-10-CM

## 2020-10-21 LAB — CBC WITH DIFFERENTIAL/PLATELET
Basophils Absolute: 0.1 10*3/uL (ref 0.0–0.2)
Basos: 1 %
EOS (ABSOLUTE): 0.5 10*3/uL — ABNORMAL HIGH (ref 0.0–0.4)
Eos: 5 %
Hematocrit: 39.2 % (ref 34.0–46.6)
Hemoglobin: 13 g/dL (ref 11.1–15.9)
Immature Grans (Abs): 0 10*3/uL (ref 0.0–0.1)
Immature Granulocytes: 0 %
Lymphocytes Absolute: 3.7 10*3/uL — ABNORMAL HIGH (ref 0.7–3.1)
Lymphs: 37 %
MCH: 28.8 pg (ref 26.6–33.0)
MCHC: 33.2 g/dL (ref 31.5–35.7)
MCV: 87 fL (ref 79–97)
Monocytes Absolute: 0.8 10*3/uL (ref 0.1–0.9)
Monocytes: 8 %
Neutrophils Absolute: 4.8 10*3/uL (ref 1.4–7.0)
Neutrophils: 49 %
Platelets: 339 10*3/uL (ref 150–450)
RBC: 4.51 x10E6/uL (ref 3.77–5.28)
RDW: 12.1 % (ref 11.7–15.4)
WBC: 10 10*3/uL (ref 3.4–10.8)

## 2020-10-21 LAB — LIPID PANEL
Chol/HDL Ratio: 2 ratio (ref 0.0–4.4)
Cholesterol, Total: 136 mg/dL (ref 100–199)
HDL: 67 mg/dL (ref >39)
LDL Chol Calc (NIH): 51 mg/dL (ref 0–99)
Triglycerides: 97 mg/dL (ref 0–149)
VLDL Cholesterol Cal: 18 mg/dL (ref 5–40)

## 2020-10-21 LAB — CMP14+EGFR
ALT: 18 IU/L (ref 0–32)
AST: 18 IU/L (ref 0–40)
Albumin/Globulin Ratio: 1.9 (ref 1.2–2.2)
Albumin: 4.2 g/dL (ref 3.6–4.6)
Alkaline Phosphatase: 102 IU/L (ref 44–121)
BUN/Creatinine Ratio: 22 (ref 12–28)
BUN: 18 mg/dL (ref 8–27)
Bilirubin Total: 0.6 mg/dL (ref 0.0–1.2)
CO2: 22 mmol/L (ref 20–29)
Calcium: 10.6 mg/dL — ABNORMAL HIGH (ref 8.7–10.3)
Chloride: 104 mmol/L (ref 96–106)
Creatinine, Ser: 0.81 mg/dL (ref 0.57–1.00)
Globulin, Total: 2.2 g/dL (ref 1.5–4.5)
Glucose: 94 mg/dL (ref 65–99)
Potassium: 4.6 mmol/L (ref 3.5–5.2)
Sodium: 141 mmol/L (ref 134–144)
Total Protein: 6.4 g/dL (ref 6.0–8.5)
eGFR: 73 mL/min/{1.73_m2} (ref >59)

## 2020-10-21 LAB — HEMOGLOBIN A1C
Est. average glucose Bld gHb Est-mCnc: 151 mg/dL
Hgb A1c MFr Bld: 6.9 % — ABNORMAL HIGH (ref 4.8–5.6)

## 2020-10-21 LAB — TSH+FREE T4
Free T4: 1.69 ng/dL (ref 0.82–1.77)
TSH: 5.43 u[IU]/mL — ABNORMAL HIGH (ref 0.450–4.500)

## 2020-10-21 MED ORDER — LEVOTHYROXINE SODIUM 112 MCG PO TABS: 112.0000 ug | ORAL_TABLET | Freq: Every day | ORAL | 2 refills | Status: DC

## 2020-10-24 ENCOUNTER — Other Ambulatory Visit: Payer: Self-pay

## 2020-10-24 ENCOUNTER — Ambulatory Visit (HOSPITAL_COMMUNITY)
Admission: RE | Admit: 2020-10-24 | Discharge: 2020-10-24 | Disposition: A | Discharge status: Home / Self Care | Payer: Medicare Other | Source: Ambulatory Visit | Attending: Internal Medicine | Admitting: Internal Medicine

## 2020-10-24 DIAGNOSIS — Z780 Asymptomatic menopausal state: Secondary | ICD-10-CM | POA: Diagnosis not present

## 2020-10-25 ENCOUNTER — Other Ambulatory Visit: Payer: Self-pay | Admitting: Internal Medicine

## 2020-10-25 DIAGNOSIS — M810 Age-related osteoporosis without current pathological fracture: Secondary | ICD-10-CM

## 2020-10-25 MED ORDER — ALENDRONATE SODIUM 70 MG PO TABS: 70.0000 mg | ORAL_TABLET | ORAL | 3 refills | Status: DC

## 2020-10-31 ENCOUNTER — Telehealth: Payer: Self-pay

## 2020-10-31 ENCOUNTER — Other Ambulatory Visit: Payer: Self-pay | Admitting: *Deleted

## 2020-10-31 DIAGNOSIS — E039 Hypothyroidism, unspecified: Secondary | ICD-10-CM

## 2020-10-31 MED ORDER — LEVOTHYROXINE SODIUM 112 MCG PO TABS
112.0000 ug | ORAL_TABLET | Freq: Every day | ORAL | 2 refills | Status: DC
Start: 2020-10-31 — End: 2021-01-24

## 2020-10-31 NOTE — Telephone Encounter (Signed)
Patient needs 90 day supply instead of 30 day supply.   Levothyroxine Sodium  Alendronate 70 mg   Patient request 90 day supply instead of 30 day supply now and for future  Pharmacy  [facility] - [place], [place] - [address]  [address] [place] [address]  Phone:  [phone]  Fax:  [phone]  DEA #:  --  DAW Reason: --

## 2020-10-31 NOTE — Telephone Encounter (Signed)
90 day supply sent on levothyroxine looks like she has a 90 day supply on alendronate

## 2020-11-03 ENCOUNTER — Other Ambulatory Visit: Payer: Self-pay

## 2020-11-03 ENCOUNTER — Encounter: Payer: Self-pay | Admitting: Orthopaedic Surgery

## 2020-11-03 ENCOUNTER — Ambulatory Visit (INDEPENDENT_AMBULATORY_CARE_PROVIDER_SITE_OTHER): Payer: Medicare Other | Admitting: Orthopaedic Surgery

## 2020-11-03 VITALS — BP 136/76 | HR 91 | Ht 64.0 in | Wt 125.4 lb

## 2020-11-03 DIAGNOSIS — G8929 Other chronic pain: Secondary | ICD-10-CM

## 2020-11-03 DIAGNOSIS — M25511 Pain in right shoulder: Secondary | ICD-10-CM | POA: Diagnosis not present

## 2020-11-03 NOTE — Progress Notes (Signed)
Subjective:    Patient ID: Lisa Beck, female    DOB: Mar 11, 1941, 80 y.o.   MRN: [id number]  HPI She has long history of right shoulder pain.  It is getting worse.  She had X-rays done in October of 2021 showing DJD changes of the shoulder and calcium deposits. She is not improving. She cannot take NSAIDs.  She has no trauma. She has no redness.  The shoulder does pop at times.  She has no neck pain.   Review of Systems  Constitutional:  Positive for activity change.  HENT:  Positive for hearing loss.   Musculoskeletal:  Positive for arthralgias.  All other systems reviewed and are negative. For Review of Systems, all other systems reviewed and are negative.  The following is a summary of the past history medically, past history surgically, known current medicines, social history and family history.  This information is gathered electronically by the computer from prior information and documentation.  I review this each visit and have found including this information at this point in the chart is beneficial and informative.   Past Medical History:  Diagnosis Date   Arthritis    Diabetes mellitus without complication (HCC)    History of kidney stones    HOH (hard of hearing)    Hyperlipidemia    Hypertension    Hypothyroidism     Past Surgical History:  Procedure Laterality Date   COLONOSCOPY  2007   Dr. Karilyn Cota: diverticulosis   COLONOSCOPY WITH PROPOFOL N/A 04/24/2019   Dr. Jena Gauss: Diverticulosis in the sigmoid colon, two 5 to 9 mm polyps removed from the descending and ascending colon.  Pathology revealed both to be benign polyps with no adenomatous changes. No future surveillance colonoscopies due to age.   ESOPHAGOGASTRODUODENOSCOPY (EGD) WITH PROPOFOL N/A 04/24/2019   Dr. Jena Gauss: Small hiatal hernia, one gastric polyp benign.   POLYPECTOMY  04/24/2019   Procedure: POLYPECTOMY;  Surgeon: Corbin Ade, MD;  [facility];  Service: Endoscopy;;  ascending;     Current Outpatient Medications on File Prior to Visit  Medication Sig Dispense Refill   alendronate (FOSAMAX) 70 MG tablet Take 1 tablet (70 mg total) by mouth every 7 (seven) days. Take with a full glass of water on an empty stomach. 12 tablet 3   amLODipine (NORVASC) 5 MG tablet Take 5 mg by mouth daily.     aspirin EC 81 MG tablet Take 81 mg by mouth daily.      atorvastatin (LIPITOR) 40 MG tablet Take 40 mg by mouth every evening.     Dulaglutide (TRULICITY) 0.75 MG/0.5ML SOPN Inject into the skin. Once a week     ferrous sulfate 324 (65 Fe) MG TBEC Take 1 tablet (324 mg total) by mouth 2 (two) times daily with a meal. 60 tablet 5   insulin glargine (LANTUS) 100 UNIT/ML injection Inject 26 Units into the skin at bedtime.      levothyroxine (SYNTHROID) 112 MCG tablet Take 1 tablet (112 mcg total) by mouth daily before breakfast. 90 tablet 2   metFORMIN (GLUCOPHAGE) 500 MG tablet Take 500 mg by mouth 2 (two) times daily.     metoprolol succinate (TOPROL-XL) 50 MG 24 hr tablet Take 50 mg by mouth daily. Take with or immediately following a meal.     telmisartan-hydrochlorothiazide (MICARDIS HCT) 40-12.5 MG per tablet Take 1 tablet by mouth daily.     VITAMIN D PO Take by mouth daily.     No  current facility-administered medications on file prior to visit.    Social History   Socioeconomic History   Marital status: Married    Spouse name: Not on file   Number of children: Not on file   Years of education: Not on file   Highest education level: Not on file  Occupational History   Not on file  Tobacco Use   Smoking status: Never   Smokeless tobacco: Never  Vaping Use   Vaping Use: Never used  Substance and Sexual Activity   Alcohol use: No   Drug use: No   Sexual activity: Not Currently  Other Topics Concern   Not on file  Social History Narrative   Not on file   Social Determinants of Health   Financial Resource Strain: Not on file  Food Insecurity: Not on file   Transportation Needs: Not on file  Physical Activity: Not on file  Stress: Not on file  Social Connections: Not on file  Intimate Partner Violence: Not on file    Family History  Problem Relation Age of Onset   CAD Father        MI sometime in 45, age unknown   CAD Brother        Pt has 3 brothers that died of MIs between ages 16s-70s   CAD Brother    CAD Brother    Colon cancer Neg Hx    Ulcers Neg Hx     BP 136/76   Pulse 91   Ht 5\' 4"  (1.626 m)   Wt 125 lb 6.4 oz (56.9 kg)   BMI 21.52 kg/m   Body mass index is 21.52 kg/m.     Objective:   Physical Exam Vitals and nursing note reviewed. Exam conducted with a chaperone present.  Constitutional:      Appearance: She is well-developed.  HENT:     Head: Normocephalic and atraumatic.  Eyes:     Conjunctiva/sclera: Conjunctivae normal.     Pupils: Pupils are equal, round, and reactive to light.  Cardiovascular:     Rate and Rhythm: Normal rate and regular rhythm.  Pulmonary:     Effort: Pulmonary effort is normal.  Abdominal:     Palpations: Abdomen is soft.  Musculoskeletal:       Arms:     Cervical back: Normal range of motion and neck supple.  Skin:    General: Skin is warm and dry.  Neurological:     Mental Status: She is alert and oriented to person, place, and time.     Cranial Nerves: No cranial nerve deficit.     Motor: No abnormal muscle tone.     Coordination: Coordination normal.     Deep Tendon Reflexes: Reflexes are normal and symmetric. Reflexes normal.  Psychiatric:        Behavior: Behavior normal.        Thought Content: Thought content normal.        Judgment: Judgment normal.          Assessment & Plan:   Encounter Diagnosis  Name Primary?   Chronic right shoulder pain Yes   I have shown her the X-rays she had done in October and explained the findings to her.  PROCEDURE NOTE:  The patient request injection, verbal consent was obtained.  The right shoulder was  prepped appropriately after time out was performed.   Sterile technique was observed and injection of 1 cc of Celestone 6 mg with several cc's of plain xylocaine. Anesthesia  was provided by ethyl chloride and a 20-gauge needle was used to inject the shoulder area. A posterior approach was used.  The injection was tolerated well.  A band aid dressing was applied.  The patient was advised to apply ice later today and tomorrow to the injection sight as needed.   She may need PT.  I want to see how she does.  She cannot take NSAIDs.  Return in three weeks.  Call if any problem.  Precautions discussed.  Electronically Signed Darreld Mclean, MD 8/18/202211:02 AM

## 2020-11-14 ENCOUNTER — Other Ambulatory Visit: Payer: Self-pay | Admitting: Gastroenterology

## 2020-11-19 ENCOUNTER — Other Ambulatory Visit: Payer: Self-pay

## 2020-11-19 ENCOUNTER — Encounter: Payer: Medicare Other | Admitting: *Deleted

## 2020-11-19 NOTE — Progress Notes (Signed)
This was a tele health visit and the patient did not answer the phone.     This encounter was created in error - please disregard.

## 2020-11-24 ENCOUNTER — Encounter: Payer: Self-pay | Admitting: Orthopaedic Surgery

## 2020-11-24 ENCOUNTER — Ambulatory Visit (INDEPENDENT_AMBULATORY_CARE_PROVIDER_SITE_OTHER): Payer: Medicare Other | Admitting: Orthopaedic Surgery

## 2020-11-24 ENCOUNTER — Other Ambulatory Visit: Payer: Self-pay | Admitting: *Deleted

## 2020-11-24 ENCOUNTER — Other Ambulatory Visit: Payer: Self-pay

## 2020-11-24 VITALS — BP 142/82 | HR 80 | Ht 64.0 in | Wt 125.6 lb

## 2020-11-24 DIAGNOSIS — M25511 Pain in right shoulder: Secondary | ICD-10-CM | POA: Diagnosis not present

## 2020-11-24 DIAGNOSIS — G8929 Other chronic pain: Secondary | ICD-10-CM | POA: Diagnosis not present

## 2020-11-24 MED ORDER — INSULIN GLARGINE 100 UNIT/ML ~~LOC~~ SOLN: 26.0000 [IU] | Freq: Every day | SUBCUTANEOUS | 1 refills | Status: DC

## 2020-11-24 NOTE — Progress Notes (Signed)
My shoulder still hurts.  The injection for the right shoulder did not help that much. She still has pain of the right shoulder that is not improving. She has limited motion. She has no new trauma.  Motion of the right shoulder is limited.  She is in pain.  NV intact.  She has crepitus.  Encounter Diagnosis  Name Primary?   Chronic right shoulder pain Yes   I will get MRI of the right shoulder.  I will have her seen at OT.  Return in three weeks.  Call if any problem.  Precautions discussed.  Electronically Signed Darreld Mclean, MD 9/8/20229:29 AM

## 2020-11-28 ENCOUNTER — Other Ambulatory Visit: Payer: Self-pay

## 2020-11-28 DIAGNOSIS — D509 Iron deficiency anemia, unspecified: Secondary | ICD-10-CM

## 2020-11-29 ENCOUNTER — Other Ambulatory Visit: Payer: Self-pay | Admitting: *Deleted

## 2020-11-29 ENCOUNTER — Telehealth: Payer: Self-pay

## 2020-11-29 MED ORDER — AMLODIPINE BESYLATE 5 MG PO TABS: 5.0000 mg | ORAL_TABLET | Freq: Every day | ORAL | 1 refills | Status: DC

## 2020-11-29 MED ORDER — INSULIN GLARGINE 100 UNIT/ML ~~LOC~~ SOLN: 26.0000 [IU] | Freq: Every day | SUBCUTANEOUS | 1 refills | Status: DC

## 2020-11-29 NOTE — Telephone Encounter (Signed)
Patient spouse stop by the office said patient  has not received this medicine need prior authorization from provider.  Patient is on the last pen.  Needs ASAP.  Contact patient at [phone] or spouse Fayrene Fearing [phone]  insulin glargine (LANTUS) 100 UNIT/ML injection  amLODipine (NORVASC) 5 MG tablet    Pharmacy  [facility] - [place], [place] - [address]  [address] [place] [address]  Phone:  [phone]  Fax:  [phone]  DEA #:  --  DAW Reason: --

## 2020-11-29 NOTE — Telephone Encounter (Signed)
[facility]

## 2020-11-30 ENCOUNTER — Ambulatory Visit (INDEPENDENT_AMBULATORY_CARE_PROVIDER_SITE_OTHER): Payer: Medicare Other

## 2020-11-30 ENCOUNTER — Other Ambulatory Visit: Payer: Self-pay

## 2020-11-30 ENCOUNTER — Ambulatory Visit (HOSPITAL_COMMUNITY): Payer: Medicare Other | Attending: Orthopaedic Surgery | Admitting: Occupational Therapy

## 2020-11-30 ENCOUNTER — Encounter (HOSPITAL_COMMUNITY): Payer: Self-pay | Admitting: Occupational Therapy

## 2020-11-30 DIAGNOSIS — R29898 Other symptoms and signs involving the musculoskeletal system: Secondary | ICD-10-CM | POA: Diagnosis present

## 2020-11-30 DIAGNOSIS — G8929 Other chronic pain: Secondary | ICD-10-CM | POA: Diagnosis present

## 2020-11-30 DIAGNOSIS — Z5329 Procedure and treatment not carried out because of patient's decision for other reasons: Secondary | ICD-10-CM

## 2020-11-30 DIAGNOSIS — M25511 Pain in right shoulder: Secondary | ICD-10-CM | POA: Insufficient documentation

## 2020-11-30 DIAGNOSIS — M25611 Stiffness of right shoulder, not elsewhere classified: Secondary | ICD-10-CM | POA: Diagnosis present

## 2020-11-30 NOTE — Therapy (Signed)
[facility] [address] [place], [place], [address] Phone: [phone]   Fax:  [phone]  Occupational Therapy Evaluation  Patient Details  Name: Lisa Beck MRN: [id number] Date of Birth: 31-Oct-1940 Referring Provider (OT): Dr. Darreld Mclean   Encounter Date: 11/30/2020   OT End of Session - 11/30/20 0903     Visit Number 1    Number of Visits 8    Date for OT Re-Evaluation 12/30/20    Authorization Type 1) Medicare A & B 2) [employer] for life    Progress Note Due on Visit 10    OT Start Time [id number]    OT Stop Time 0845    OT Time Calculation (min) 26 min    Activity Tolerance Patient tolerated treatment well    Behavior During Therapy [facility] for tasks assessed/performed             Past Medical History:  Diagnosis Date   Arthritis    Diabetes mellitus without complication (HCC)    History of kidney stones    HOH (hard of hearing)    Hyperlipidemia    Hypertension    Hypothyroidism     Past Surgical History:  Procedure Laterality Date   COLONOSCOPY  2007   Dr. Karilyn Cota: diverticulosis   COLONOSCOPY WITH PROPOFOL N/A 04/24/2019   Dr. Jena Gauss: Diverticulosis in the sigmoid colon, two 5 to 9 mm polyps removed from the descending and ascending colon.  Pathology revealed both to be benign polyps with no adenomatous changes. No future surveillance colonoscopies due to age.   ESOPHAGOGASTRODUODENOSCOPY (EGD) WITH PROPOFOL N/A 04/24/2019   Dr. Jena Gauss: Small hiatal hernia, one gastric polyp benign.   POLYPECTOMY  04/24/2019   Procedure: POLYPECTOMY;  Surgeon: Corbin Ade, MD;  [facility];  Service: Endoscopy;;  ascending;    There were no vitals filed for this visit.   Subjective Assessment - 11/30/20 0900     Subjective  S: It's been hurting for about nine months.    Pertinent History Pt is an 80 y/o female presenting with right shoulder pain, present for approximately 9 months. Pt reports she received a cortisone  shot but did not have any relief. Pt was referred to occupational therapy for evaluation and treatment by Dr. Darreld Mclean.    Special Tests FOTO: 54/100    Patient Stated Goals To have less pain in my arm.    Currently in Pain? Yes    Pain Score 3     Pain Location Shoulder    Pain Orientation Right    Pain Descriptors / Indicators Aching;Sore    Pain Type Chronic pain    Pain Radiating Towards neck and elbow    Pain Onset More than a month ago    Pain Frequency Intermittent    Aggravating Factors  certain movements, quick movements    Pain Relieving Factors rest, heating pad, OTC pain medication    Effect of Pain on Daily Activities mod to max effect on ADL completion    Multiple Pain Sites No               [facility] OT Assessment - 11/30/20 0821       Assessment   Medical Diagnosis right shoulder pain    Referring Provider (OT) Dr. Darreld Mclean    Onset Date/Surgical Date --   approximately 9 months   Hand Dominance Right    Next MD Visit 12/15/2020    Prior Therapy None  Precautions   Precautions None      Restrictions   Weight Bearing Restrictions No      Balance Screen   Has the patient fallen in the past 6 months No      Prior Function   Level of Independence Independent    Vocation Retired    Leisure gardening      ADL   ADL comments Pt is having difficulty with dressing-reaching behind back and overhead, lifting items. Pt reports dropping items at times, has difficulty sleeping at times.      Written Expression   Dominant Hand Right      Cognition   Overall Cognitive Status Within Functional Limits for tasks assessed      Observation/Other Assessments   Focus on Therapeutic Outcomes (FOTO)  54/100      ROM / Strength   AROM / PROM / Strength AROM;PROM;Strength      Palpation   Palpation comment mod fascial restrictions along right upper arm, trapezius, and scapular regions      AROM   Overall AROM Comments Assessed seated, er/IR adducted     AROM Assessment Site Shoulder    Right/Left Shoulder Right    Right Shoulder Flexion 90 Degrees    Right Shoulder ABduction 74 Degrees    Right Shoulder Internal Rotation 90 Degrees    Right Shoulder External Rotation 28 Degrees      PROM   Overall PROM Comments Assessed supine, er/IR adducted    PROM Assessment Site Shoulder    Right/Left Shoulder Right    Right Shoulder Flexion 125 Degrees    Right Shoulder ABduction 112 Degrees    Right Shoulder Internal Rotation 90 Degrees    Right Shoulder External Rotation 36 Degrees      Strength   Overall Strength Comments Assessed seated, er/IR adducted    Strength Assessment Site Shoulder    Right/Left Shoulder Right    Right Shoulder Flexion 3-/5    Right Shoulder ABduction 3-/5    Right Shoulder Internal Rotation 3/5    Right Shoulder External Rotation 3-/5                              OT Education - 11/30/20 0838     Education Details table slides    Person(s) Educated Patient    Methods Explanation;Demonstration;Handout    Comprehension Verbalized understanding;Returned demonstration              OT Short Term Goals - 11/30/20 0907       OT SHORT TERM GOAL #1   Title Pt will be provided with and educated on HEP to improve mobility required for RUE use as dominant during daily tasks.    Time 4    Period Weeks    Status New    Target Date 12/30/20      OT SHORT TERM GOAL #2   Title Pt will decrease pain in RUE to 3/10 or less to increase ability to sleep without waking due to pain for 3+ hours or greater.    Time 4    Period Weeks    Status New      OT SHORT TERM GOAL #3   Title Pt will decrease RUE fascial restrictions to minimal amounts or less to improve ability to perform functional reaching tasks.    Time 4    Period Weeks    Status New      OT SHORT  TERM GOAL #4   Title Pt will increase RUE A/ROM to [facility] to improve ability to reach overhead and behind back for dressing tasks.     Time 4    Period Weeks    Status New      OT SHORT TERM GOAL #5   Title Pt will increase RUE strength to 4/5 or greater to improve ability to lift items into and out of overhead cabinets.    Time 4    Period Weeks    Status New                      Plan - 11/30/20 0904     Clinical Impression Statement A: Pt is an 80 y/o female presenting with shoulder pain with no known origin, with functional limitations impacting use of dominant RUE during ADLs. Pt reports pain is in random spots and she is unable to predict what movements cause the pain.    OT Occupational Profile and History Problem Focused Assessment - Including review of records relating to presenting problem    Occupational performance deficits (Please refer to evaluation for details): ADL's;IADL's;Rest and Sleep;Leisure    Body Structure / Function / Physical Skills ADL;Endurance;UE functional use;Fascial restriction;Pain;ROM;IADL;Strength    Rehab Potential Good    Clinical Decision Making Limited treatment options, no task modification necessary    Comorbidities Affecting Occupational Performance: None    Modification or Assistance to Complete Evaluation  No modification of tasks or assist necessary to complete eval    OT Frequency 2x / week    OT Duration 4 weeks    OT Treatment/Interventions Self-care/ADL training;Ultrasound;Patient/family education;Passive range of motion;Cryotherapy;Electrical Stimulation;Splinting;Moist Heat;Therapeutic exercise;Manual Therapy;Therapeutic activities    Plan P: Pt will benefit from skilled OT services to decrease pain and fascial restrictions, increase joint ROM, strength, and functional use of RUE. Treatment plan: Myofascial release, manual techniques, P/ROM, AA/ROM, A/ROM, general RUE strengthening, modalities prn    OT Home Exercise Plan eval: table slides    Consulted and Agree with Plan of Care Patient             Patient will benefit from skilled therapeutic  intervention in order to improve the following deficits and impairments:   Body Structure / Function / Physical Skills: ADL, Endurance, UE functional use, Fascial restriction, Pain, ROM, IADL, Strength       Visit Diagnosis: Chronic right shoulder pain  Stiffness of right shoulder, not elsewhere classified  Other symptoms and signs involving the musculoskeletal system    Problem List Patient Active Problem List   Diagnosis Date Noted   HTN (hypertension) 10/19/2020   HLD (hyperlipidemia) 10/19/2020   Hypothyroidism 10/19/2020   Encounter to establish care 10/19/2020   Primary osteoarthritis of right shoulder 10/19/2020   Macular pucker, left eye 08/27/2019   Macular retinoschisis, 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 [place], [place], [address] Phone: [phone]   Fax:  [phone]  Name: Lisa Beck MRN: [id number] Date of Birth: Feb 20, 1941

## 2020-11-30 NOTE — Progress Notes (Deleted)
Subjective:   Lisa Beck is a 80 y.o. female who presents for an Initial Medicare Annual Wellness Visit. I connected with  Lisa Beck on 11/30/20 by a audio enabled telemedicine application and verified that I am speaking with the correct person using two identifiers.   I discussed the limitations of evaluation and management by telemedicine. The patient expressed understanding and agreed to proceed.   Location of patient:Home  Location of Provider:Office  Persons participating in virtual visit:Lisa Beck (patient) and Lisa Beck  Review of Systems    Defer to PCP       Objective:    There were no vitals filed for this visit. There is no height or weight on file to calculate BMI.  Advanced Directives 11/30/2020 04/22/2019 10/20/2017 06/15/2014  Does Patient Have a Medical Advance Directive? Yes Yes No No  Type of Estate agent of [place];Living will Healthcare Power of [place];Living will - -  Does patient want to make changes to medical advance directive? No - Patient declined No - Patient declined - -  Copy of Healthcare Power of Attorney in Chart? No - copy requested Yes - validated most recent copy scanned in chart (See row information) - -  Would patient like information on creating a medical advance directive? - No - Patient declined - No - patient declined information    Current Medications (verified) Outpatient Encounter Medications as of 11/30/2020  Medication Sig   amLODipine (NORVASC) 5 MG tablet Take 1 tablet (5 mg total) by mouth daily.   aspirin EC 81 MG tablet Take 81 mg by mouth daily.    atorvastatin (LIPITOR) 40 MG tablet Take 40 mg by mouth every evening.   Dulaglutide (TRULICITY) 0.75 MG/0.5ML SOPN Inject into the skin. Once a week   ferrous sulfate 324 (65 Fe) MG TBEC TAKE 1 TABLET BY MOUTH TWICE DAILY WITH A MEAL   insulin glargine (LANTUS) 100 UNIT/ML injection Inject 0.26 mLs (26 Units total) into the skin at bedtime.    levothyroxine (SYNTHROID) 112 MCG tablet Take 1 tablet (112 mcg total) by mouth daily before breakfast.   metFORMIN (GLUCOPHAGE) 500 MG tablet Take 500 mg by mouth 2 (two) times daily.   metoprolol succinate (TOPROL-XL) 50 MG 24 hr tablet Take 50 mg by mouth daily. Take with or immediately following a meal.   telmisartan-hydrochlorothiazide (MICARDIS HCT) 40-12.5 MG per tablet Take 1 tablet by mouth daily.   VITAMIN D PO Take by mouth daily.   No facility-administered encounter medications on file as of 11/30/2020.    Allergies (verified) Patient has no known allergies.   History: Past Medical History:  Diagnosis Date   Arthritis    Diabetes mellitus without complication (HCC)    History of kidney stones    HOH (hard of hearing)    Hyperlipidemia    Hypertension    Hypothyroidism    Past Surgical History:  Procedure Laterality Date   COLONOSCOPY  2007   Dr. Karilyn Cota: diverticulosis   COLONOSCOPY WITH PROPOFOL N/A 04/24/2019   Dr. Jena Gauss: Diverticulosis in the sigmoid colon, two 5 to 9 mm polyps removed from the descending and ascending colon.  Pathology revealed both to be benign polyps with no adenomatous changes. No future surveillance colonoscopies due to age.   ESOPHAGOGASTRODUODENOSCOPY (EGD) WITH PROPOFOL N/A 04/24/2019   Dr. Jena Gauss: Small hiatal hernia, one gastric polyp benign.   POLYPECTOMY  04/24/2019   Procedure: POLYPECTOMY;  Surgeon: Corbin Ade, MD;  [facility];  Service: Endoscopy;;  ascending;   Family History  Problem Relation Age of Onset   CAD Father        MI sometime in 26, age unknown   CAD Brother        Pt has 3 brothers that died of MIs between ages 76s-70s   CAD Brother    CAD Brother    Colon cancer Neg Hx    Ulcers Neg Hx    Social History   Socioeconomic History   Marital status: Married    Spouse name: Not on file   Number of children: Not on file   Years of education: Not on file   Highest education level: Not on file   Occupational History   Not on file  Tobacco Use   Smoking status: Never   Smokeless tobacco: Never  Vaping Use   Vaping Use: Never used  Substance and Sexual Activity   Alcohol use: No   Drug use: No   Sexual activity: Not Currently  Other Topics Concern   Not on file  Social History Narrative   Not on file   Social Determinants of Health   Financial Resource Strain: Not on file  Food Insecurity: Not on file  Transportation Needs: Not on file  Physical Activity: Not on file  Stress: Not on file  Social Connections: Not on file    Tobacco Counseling Counseling given: Not Answered   Clinical Intake:                 Diabetic?YES Nutrition Risk Assessment:  Has the patient had any N/V/D within the last 2 months?  {YES/NO:21197} Does the patient have any non-healing wounds?  {YES/NO:21197} Has the patient had any unintentional weight loss or weight gain?  {YES/NO:21197}  Diabetes:  Is the patient diabetic?  Yes  If diabetic, was a CBG obtained today?  {YES/NO:21197} Did the patient bring in their glucometer from home?  {YES/NO:21197} How often do you monitor your CBG's? ***.   Financial Strains and Diabetes Management:  Are you having any financial strains with the device, your supplies or your medication? {YES/NO:21197}.  Does the patient want to be seen by Chronic Care Management for management of their diabetes?  {YES/NO:21197} Would the patient like to be referred to a Nutritionist or for Diabetic Management?  {YES/NO:21197}  Diabetic Exams:  Diabetic Eye Exam: Overdue for diabetic eye exam. Pt has been advised about the importance in completing this exam. Patient advised to call and schedule an eye exam. Diabetic Foot Exam: Completed 10/19/2020           Activities of Daily Living In your present state of health, do you have any difficulty performing the following activities: 10/19/2020  Hearing? N  Vision? N  Difficulty concentrating or making  decisions? N  Walking or climbing stairs? Y  Dressing or bathing? N  Doing errands, shopping? N  Some recent data might be hidden    Patient Care Team: Anabel Halon, MD as PCP - General (Internal Medicine) Jena Gauss Gerrit Friends, MD as Consulting Physician (Gastroenterology) Smitty Cords, OD (Optometry)  Indicate any recent Medical Services you may have received from other than [facility] providers in the past year (date may be approximate).     Assessment:   This is a routine wellness examination for [place].  Hearing/Vision screen No results found.  Dietary issues and exercise activities discussed:     Goals Addressed   None    Depression Screen PHQ 2/9 Scores 10/19/2020  PHQ - 2 Score 0    Fall Risk Fall Risk  10/19/2020  Falls in the past year? 0  Number falls in past yr: 0  Injury with Fall? 0  Risk for fall due to : No Fall Risks  Follow up Falls evaluation completed    FALL RISK PREVENTION PERTAINING TO THE HOME:  Any stairs in or around the home? {YES/NO:21197} If so, are there any without handrails? {YES/NO:21197} Home free of loose throw rugs in walkways, pet beds, electrical cords, etc? {YES/NO:21197} Adequate lighting in your home to reduce risk of falls? {YES/NO:21197}  ASSISTIVE DEVICES UTILIZED TO PREVENT FALLS:  Life alert? {YES/NO:21197} Use of a cane, walker or w/c? {YES/NO:21197} Grab bars in the bathroom? {YES/NO:21197} Shower chair or bench in shower? {YES/NO:21197} Elevated toilet seat or a handicapped toilet? {YES/NO:21197}  TIMED UP AND GO:  Was the test performed?  N/A .  Length of time to ambulate 10 feet: N/A sec.     Cognitive Function:        Immunizations Immunization History  Administered Date(s) Administered   [employer] Vaccination 04/11/2019, 05/11/2019, 07/21/2020    TDAP status: Due, Education has been provided regarding the importance of this vaccine. Advised may receive this vaccine at local pharmacy or  Health Dept. Aware to provide a copy of the vaccination record if obtained from local pharmacy or Health Dept. Verbalized acceptance and understanding.  Flu Vaccine status: Due, Education has been provided regarding the importance of this vaccine. Advised may receive this vaccine at local pharmacy or Health Dept. Aware to provide a copy of the vaccination record if obtained from local pharmacy or Health Dept. Verbalized acceptance and understanding.  Pneumococcal vaccine status: Due, Education has been provided regarding the importance of this vaccine. Advised may receive this vaccine at local pharmacy or Health Dept. Aware to provide a copy of the vaccination record if obtained from local pharmacy or Health Dept. Verbalized acceptance and understanding.  Covid-19 vaccine status: Information provided on how to obtain vaccines.   Qualifies for Shingles Vaccine? Yes   Zostavax completed No   Shingrix Completed?: No.    Education has been provided regarding the importance of this vaccine. Patient has been advised to call insurance company to determine out of pocket expense if they have not yet received this vaccine. Advised may also receive vaccine at local pharmacy or Health Dept. Verbalized acceptance and understanding.  Screening Tests Health Maintenance  Topic Date Due   TETANUS/TDAP  Never done   Zoster Vaccines- Shingrix (1 of 2) Never done   PNA vac Low Risk Adult (1 of 2 - PCV13) Never done   OPHTHALMOLOGY EXAM  09/15/2020   INFLUENZA VACCINE  Never done   COVID-19 Vaccine (4 - Booster for Moderna series) 11/21/2020   HEMOGLOBIN A1C  04/22/2021   FOOT EXAM  10/19/2021   DEXA SCAN  Completed   HPV VACCINES  Aged Out    Health Maintenance  Health Maintenance Due  Topic Date Due   TETANUS/TDAP  Never done   Zoster Vaccines- Shingrix (1 of 2) Never done   PNA vac Low Risk Adult (1 of 2 - PCV13) Never done   OPHTHALMOLOGY EXAM  09/15/2020   INFLUENZA VACCINE  Never done   COVID-19  Vaccine (4 - Booster for Moderna series) 11/21/2020    Colorectal cancer screening: No longer required.   Mammogram status: No longer required due to age.  Bone Density status: Completed 10/24/2020. Results reflect: Bone density results: OSTEOPOROSIS.  Repeat every 2 years.  Lung Cancer Screening: (Low Dose CT Chest recommended if Age 48-80 years, 30 pack-year currently smoking OR have quit w/in 15years.) {DOES NOT does:27190::"does not"} qualify.   Lung Cancer Screening Referral: no  Additional Screening:  Hepatitis C Screening: does not qualify;   Vision Screening: Recommended annual ophthalmology exams for early detection of glaucoma and other disorders of the eye. Is the patient up to date with their annual eye exam?  {YES/NO:21197} Who is the provider or what is the name of the office in which the patient attends annual eye exams? *** If pt is not established with a provider, would they like to be referred to a provider to establish care? No .   Dental Screening: Recommended annual dental exams for proper oral hygiene  Community Resource Referral / Chronic Care Management: CRR required this visit?  {YES/NO:21197}  CCM required this visit?  {YES/NO:21197}     Plan:     I have personally reviewed and noted the following in the patient's chart:   Medical and social history Use of alcohol, tobacco or illicit drugs  Current medications and supplements including opioid prescriptions. {Opioid Prescriptions:[phone]} Functional ability and status Nutritional status Physical activity Advanced directives List of other physicians Hospitalizations, surgeries, and ER visits in previous 12 months Vitals Screenings to include cognitive, depression, and falls Referrals and appointments  In addition, I have reviewed and discussed with patient certain preventive protocols, quality metrics, and best practice recommendations. A written personalized care plan for preventive services  as well as general preventive health recommendations were provided to patient.     Victorio Palm, [facility]   11/30/2020   Nurse Notes: Non Face to Face 30 minute visit

## 2020-11-30 NOTE — Patient Instructions (Signed)
1) SHOULDER: Flexion On Table ° ° °Place hands on towel placed on table, elbows straight. Lean forward with you upper body, pushing towel away from body.  _10__ reps per set, __2-3_ sets per day ° °2) Abduction (Passive) ° ° °With arm out to side, resting on towel placed on table with palm DOWN, keeping trunk away from table, lean to the side while pushing towel away from body.  °Repeat __10__ times. Do __2-3__ sessions per day. ° °Copyright © [employer]. All rights reserved.  ° ° ° °3) Internal Rotation (Assistive) ° ° °Seated with elbow bent at right angle and held against side, slide arm on table surface in an inward arc keeping elbow anchored in place. °Repeat __10__ times. Do __2-3__ sessions per day. °Activity: Use this motion to brush crumbs off the table. ° °Copyright © [employer]. All rights reserved.  ° °

## 2020-12-01 ENCOUNTER — Ambulatory Visit (INDEPENDENT_AMBULATORY_CARE_PROVIDER_SITE_OTHER): Payer: Medicare Other | Admitting: Internal Medicine

## 2020-12-01 ENCOUNTER — Encounter: Payer: Self-pay | Admitting: Internal Medicine

## 2020-12-01 VITALS — BP 125/75 | HR 92 | Resp 18 | Ht 64.0 in | Wt 125.1 lb

## 2020-12-01 DIAGNOSIS — H6123 Impacted cerumen, bilateral: Secondary | ICD-10-CM | POA: Insufficient documentation

## 2020-12-01 DIAGNOSIS — H6121 Impacted cerumen, right ear: Secondary | ICD-10-CM | POA: Insufficient documentation

## 2020-12-01 DIAGNOSIS — Z23 Encounter for immunization: Secondary | ICD-10-CM

## 2020-12-01 MED ORDER — DEBROX 6.5 % OT SOLN: 5.0000 [drp] | Freq: Two times a day (BID) | OTIC | 0 refills | Status: AC

## 2020-12-01 NOTE — Assessment & Plan Note (Signed)
Ear irritation done b/l Debrox ear drops in both ears No signs of infection currently Advised to avoid using sharp objects for cleaning purposes

## 2020-12-01 NOTE — Progress Notes (Addendum)
Acute Office Visit  Subjective:    Patient ID: Lisa Beck, female    DOB: [birth year], 80 y.o.   MRN: [id number]  Chief Complaint  Patient presents with   Ear Fullness    Pt right ear is stopped up cant hear anything out of it and left ear is doing "funny" things hard to describe this has been this way for about 1 month     HPI Patient is in today for evaluation of b/l ear discomfort and unable to hear on the right side. She denies any ear pain or discharge. Denies any dizziness. Denies any fever, chills, nasal congestion or sore throat. She has had similar symptoms in the past and had ear irrigation for it.  Past Medical History:  Diagnosis Date   Arthritis    Diabetes mellitus without complication ([place])    History of kidney stones    HOH (hard of hearing)    Hyperlipidemia    Hypertension    Hypothyroidism     Past Surgical History:  Procedure Laterality Date   COLONOSCOPY  2007   Dr. Laural Golden: diverticulosis   COLONOSCOPY WITH PROPOFOL N/A 04/24/2019   Dr. Gala Romney: Diverticulosis in the sigmoid colon, two 5 to 9 mm polyps removed from the descending and ascending colon.  Pathology revealed both to be benign polyps with no adenomatous changes. No future surveillance colonoscopies due to age.   ESOPHAGOGASTRODUODENOSCOPY (EGD) WITH PROPOFOL N/A 04/24/2019   Dr. Gala Romney: Small hiatal hernia, one gastric polyp benign.   POLYPECTOMY  04/24/2019   Procedure: POLYPECTOMY;  Surgeon: Daneil Dolin, MD;  [facility];  Service: Endoscopy;;  ascending;    Family History  Problem Relation Age of Onset   CAD Father        MI sometime in 47, age unknown   CAD Brother        Pt has 3 brothers that died of MIs between ages 67s-70s   CAD Brother    CAD Brother    Colon cancer Neg Hx    Ulcers Neg Hx     Social History   Socioeconomic History   Marital status: Married    Spouse name: Not on file   Number of children: Not on file   Years of education: Not on  file   Highest education level: Not on file  Occupational History   Not on file  Tobacco Use   Smoking status: Never   Smokeless tobacco: Never  Vaping Use   Vaping Use: Never used  Substance and Sexual Activity   Alcohol use: No   Drug use: No   Sexual activity: Not Currently  Other Topics Concern   Not on file  Social History Narrative   Not on file   Social Determinants of Health   Financial Resource Strain: Not on file  Food Insecurity: Not on file  Transportation Needs: Not on file  Physical Activity: Not on file  Stress: Not on file  Social Connections: Not on file  Intimate Partner Violence: Not on file    Outpatient Medications Prior to Visit  Medication Sig Dispense Refill   amLODipine (NORVASC) 5 MG tablet Take 1 tablet (5 mg total) by mouth daily. 90 tablet 1   aspirin EC 81 MG tablet Take 81 mg by mouth daily.      atorvastatin (LIPITOR) 40 MG tablet Take 40 mg by mouth every evening.     Dulaglutide (TRULICITY) 5.85 ID/7.8EU SOPN Inject into the skin. Once a  week     ferrous sulfate 324 (65 Fe) MG TBEC TAKE 1 TABLET BY MOUTH TWICE DAILY WITH A MEAL 60 tablet 1   insulin glargine (LANTUS) 100 UNIT/ML injection Inject 0.26 mLs (26 Units total) into the skin at bedtime. 10 mL 1   levothyroxine (SYNTHROID) 112 MCG tablet Take 1 tablet (112 mcg total) by mouth daily before breakfast. 90 tablet 2   metFORMIN (GLUCOPHAGE) 500 MG tablet Take 500 mg by mouth 2 (two) times daily.     metoprolol succinate (TOPROL-XL) 50 MG 24 hr tablet Take 50 mg by mouth daily. Take with or immediately following a meal.     telmisartan-hydrochlorothiazide (MICARDIS HCT) 40-12.5 MG per tablet Take 1 tablet by mouth daily.     VITAMIN D PO Take by mouth daily.     No facility-administered medications prior to visit.    No Known Allergies  Review of Systems  Constitutional:  Negative for chills and fever.  HENT:  Positive for hearing loss (Right side). Negative for ear discharge, ear  pain, sinus pressure, sinus pain and voice change.   Respiratory:  Negative for cough and shortness of breath.   Gastrointestinal:  Negative for nausea and vomiting.  Genitourinary:  Negative for dysuria and hematuria.  Musculoskeletal:  Negative for neck pain and neck stiffness.  Skin:  Negative for rash.  Neurological:  Negative for dizziness and weakness.  Psychiatric/Behavioral:  Negative for agitation and behavioral problems.       Objective:    Physical Exam Vitals reviewed.  Constitutional:      General: She is not in acute distress.    Appearance: She is not diaphoretic.  HENT:     Head: Normocephalic and atraumatic.     Right Ear: There is impacted cerumen.     Left Ear: There is impacted cerumen.     Nose: Nose normal.     Mouth/Throat:     Mouth: Mucous membranes are moist.  Eyes:     General: No scleral icterus.    Extraocular Movements: Extraocular movements intact.  Cardiovascular:     Rate and Rhythm: Normal rate and regular rhythm.     Pulses: Normal pulses.     Heart sounds: Normal heart sounds. No murmur heard. Pulmonary:     Breath sounds: Normal breath sounds. No wheezing or rales.  Musculoskeletal:     Cervical back: Neck supple. No tenderness.  Skin:    General: Skin is warm.     Findings: No rash.  Neurological:     General: No focal deficit present.     Mental Status: She is alert and oriented to person, place, and time.  Psychiatric:        Mood and Affect: Mood normal.        Behavior: Behavior normal.    BP 125/75 (BP Location: Left Arm, Patient Position: Sitting, Cuff Size: Normal)   Pulse 92   Resp 18   Ht 5' 4"  (1.626 m)   Wt 125 lb 1.3 oz (56.7 kg)   SpO2 96%   BMI 21.47 kg/m  Wt Readings from Last 3 Encounters:  12/01/20 125 lb 1.3 oz (56.7 kg)  11/24/20 125 lb 9.6 oz (57 kg)  11/03/20 125 lb 6.4 oz (56.9 kg)    Health Maintenance Due  Topic Date Due   TETANUS/TDAP  Never done   Zoster Vaccines- Shingrix (1 of 2) Never  done   PNA vac Low Risk Adult (1 of 2 - PCV13) Never done  OPHTHALMOLOGY EXAM  09/15/2020   INFLUENZA VACCINE  Never done   COVID-19 Vaccine (4 - Booster for Moderna series) 11/21/2020    There are no preventive care reminders to display for this patient.   Lab Results  Component Value Date   TSH 5.430 (H) 10/20/2020   Lab Results  Component Value Date   WBC 10.0 10/20/2020   HGB 13.0 10/20/2020   HCT 39.2 10/20/2020   MCV 87 10/20/2020   PLT 339 10/20/2020   Lab Results  Component Value Date   NA 141 10/20/2020   K 4.6 10/20/2020   CO2 22 10/20/2020   GLUCOSE 94 10/20/2020   BUN 18 10/20/2020   CREATININE 0.81 10/20/2020   BILITOT 0.6 10/20/2020   ALKPHOS 102 10/20/2020   AST 18 10/20/2020   ALT 18 10/20/2020   PROT 6.4 10/20/2020   ALBUMIN 4.2 10/20/2020   CALCIUM 10.6 (H) 10/20/2020   ANIONGAP 8 04/22/2019   EGFR 73 10/20/2020   Lab Results  Component Value Date   CHOL 136 10/20/2020   Lab Results  Component Value Date   HDL 67 10/20/2020   Lab Results  Component Value Date   LDLCALC 51 10/20/2020   Lab Results  Component Value Date   TRIG 97 10/20/2020   Lab Results  Component Value Date   CHOLHDL 2.0 10/20/2020   Lab Results  Component Value Date   HGBA1C 6.9 (H) 10/20/2020       Assessment & Plan:   Problem List Items Addressed This Visit       Nervous and Auditory   Impacted cerumen of both ears - Primary    Ear irritation done b/l Debrox ear drops in both ears No signs of infection currently Advised to avoid using sharp objects for cleaning purposes      Relevant Medications   carbamide peroxide (DEBROX) 6.5 % OTIC solution     Meds ordered this encounter  Medications   carbamide peroxide (DEBROX) 6.5 % OTIC solution    Sig: Place 5 drops into both ears 2 (two) times daily.    Dispense:  15 mL    Refill:  0      Arleigh Odowd Keith Rake, MD

## 2020-12-06 ENCOUNTER — Encounter (HOSPITAL_COMMUNITY): Payer: Self-pay | Admitting: Occupational Therapy

## 2020-12-06 ENCOUNTER — Other Ambulatory Visit: Payer: Self-pay

## 2020-12-06 ENCOUNTER — Ambulatory Visit (HOSPITAL_COMMUNITY): Payer: Medicare Other | Admitting: Occupational Therapy

## 2020-12-06 DIAGNOSIS — M25511 Pain in right shoulder: Secondary | ICD-10-CM

## 2020-12-06 DIAGNOSIS — R29898 Other symptoms and signs involving the musculoskeletal system: Secondary | ICD-10-CM

## 2020-12-06 DIAGNOSIS — M25611 Stiffness of right shoulder, not elsewhere classified: Secondary | ICD-10-CM

## 2020-12-06 DIAGNOSIS — G8929 Other chronic pain: Secondary | ICD-10-CM

## 2020-12-06 NOTE — Patient Instructions (Signed)
  Repeat all exercises 10-15 times, 1-2 times per day.  1) Shoulder Protraction    Begin with elbows by your side, slowly "punch" straight out in front of you.     2) Shoulder FLEXION   In the standing position, hold a wand/cane with both arms, palms down on both sides. Raise up the wand/cane allowing your unaffected arm to perform most of the effort. Your affected arm should be partially relaxed.      3) Internal/External ROTATION   In the standing position, hold a wand/cane with both hands keeping your elbows bent. Move your arms and wand/cane to one side.  Your affected arm should be partially relaxed while your unaffected arm performs most of the effort.       4) Shoulder ABDUCTION   While holding a wand/cane palm face up on the injured side and palm face down on the uninjured side, slowly raise up your injured arm to the side.        5) Horizontal Abduction/Adduction      Straight arms holding cane at shoulder height, bring cane to right, center, left. Repeat starting to left.

## 2020-12-06 NOTE — Therapy (Signed)
[facility] [address] [place], [place], [address] Phone: [phone]   Fax:  [phone]  Occupational Therapy Treatment  Patient Details  Name: Lisa Beck MRN: [id number] Date of Birth: [birth year] Referring Provider (OT): Dr. Darreld Mclean   Encounter Date: 12/06/2020   OT End of Session - 12/06/20 0807     Visit Number 2    Number of Visits 8    Date for OT Re-Evaluation 12/30/20    Authorization Type 1) Medicare A & B 2) Tricare for life    Progress Note Due on Visit 10    OT Start Time 0730    OT Stop Time 0810    OT Time Calculation (min) 40 min    Activity Tolerance Patient tolerated treatment well    Behavior During Therapy [facility] for tasks assessed/performed             Past Medical History:  Diagnosis Date   Arthritis    Diabetes mellitus without complication (HCC)    History of kidney stones    HOH (hard of hearing)    Hyperlipidemia    Hypertension    Hypothyroidism     Past Surgical History:  Procedure Laterality Date   COLONOSCOPY  2007   Dr. Karilyn Cota: diverticulosis   COLONOSCOPY WITH PROPOFOL N/A 04/24/2019   Dr. Jena Gauss: Diverticulosis in the sigmoid colon, two 5 to 9 mm polyps removed from the descending and ascending colon.  Pathology revealed both to be benign polyps with no adenomatous changes. No future surveillance colonoscopies due to age.   ESOPHAGOGASTRODUODENOSCOPY (EGD) WITH PROPOFOL N/A 04/24/2019   Dr. Jena Gauss: Small hiatal hernia, one gastric polyp benign.   POLYPECTOMY  04/24/2019   Procedure: POLYPECTOMY;  Surgeon: Corbin Ade, MD;  [facility];  Service: Endoscopy;;  ascending;    There were no vitals filed for this visit.   Subjective Assessment - 12/06/20 0731     Subjective  S: It hurts all the time.    Currently in Pain? Yes    Pain Score 6     Pain Location Shoulder    Pain Orientation Right    Pain Descriptors / Indicators Constant;Aching    Pain Type Chronic  pain    Pain Radiating Towards up to the neck    Pain Onset More than a month ago    Pain Frequency Intermittent    Aggravating Factors  certain movements, quick movements    Pain Relieving Factors rest, heating pad, OTC pain medication    Effect of Pain on Daily Activities mod to max effect on ADLs    Multiple Pain Sites No                [facility] OT Assessment - 12/06/20 0730       Assessment   Medical Diagnosis right shoulder pain      Precautions   Precautions None                      OT Treatments/Exercises (OP) - 12/06/20 0732       Exercises   Exercises Shoulder      Shoulder Exercises: Supine   Protraction PROM;5 reps;AROM;10 reps    Horizontal ABduction PROM;5 reps;AAROM;10 reps    External Rotation PROM;5 reps;AAROM;10 reps    Internal Rotation PROM;5 reps;AAROM;10 reps    Flexion PROM;5 reps;AAROM;10 reps    ABduction PROM;5 reps      Shoulder Exercises: Seated  Extension AROM;10 reps    Row AROM;10 reps      Shoulder Exercises: Standing   Protraction AROM;10 reps    Horizontal ABduction AAROM;10 reps    External Rotation AAROM;10 reps    Internal Rotation AAROM;10 reps    Flexion AAROM;10 reps    ABduction AAROM;10 reps      Shoulder Exercises: Therapy Ball   Flexion 10 reps    ABduction 10 reps      Shoulder Exercises: ROM/Strengthening   Wall Wash 1'      Manual Therapy   Manual Therapy Myofascial release    Manual therapy comments completed separately from therapeutic exercises    Myofascial Release myofascial release and manual techniques to right upper arm, anterior shoulder, and trapezius regions to decrease pain and fascial restrictions and increase joint ROM                    OT Education - 12/06/20 0753     Education Details protraction A/ROM, AA/ROM shoulder    Person(s) Educated Patient    Methods Explanation;Demonstration;Handout    Comprehension Verbalized understanding;Returned demonstration               OT Short Term Goals - 12/06/20 0749       OT SHORT TERM GOAL #1   Title Pt will be provided with and educated on HEP to improve mobility required for RUE use as dominant during daily tasks.    Time 4    Period Weeks    Status On-going    Target Date 12/30/20      OT SHORT TERM GOAL #2   Title Pt will decrease pain in RUE to 3/10 or less to increase ability to sleep without waking due to pain for 3+ hours or greater.    Time 4    Period Weeks    Status On-going      OT SHORT TERM GOAL #3   Title Pt will decrease RUE fascial restrictions to minimal amounts or less to improve ability to perform functional reaching tasks.    Time 4    Period Weeks    Status On-going      OT SHORT TERM GOAL #4   Title Pt will increase RUE A/ROM to [facility] to improve ability to reach overhead and behind back for dressing tasks.    Time 4    Period Weeks    Status On-going      OT SHORT TERM GOAL #5   Title Pt will increase RUE strength to 4/5 or greater to improve ability to lift items into and out of overhead cabinets.    Time 4    Period Weeks    Status On-going                      Plan - 12/06/20 0749     Clinical Impression Statement A: Initiated myofascial release to address fascial restrictions in RUE, passive stretching completed with pt able to tolerate approximately 75% ROM. Initiated some A/ROM and some AA/ROM in supine and standing, scapular A/ROM and wall wash completed. Verbal cuing for form and technique, HEP updated.    Body Structure / Function / Physical Skills ADL;Endurance;UE functional use;Fascial restriction;Pain;ROM;IADL;Strength    Plan P: Follow up on HEP, continue with myofascial release, progress to A/ROM as pt is able to tolerate, add pulleys    OT Home Exercise Plan eval: table slides; 9/20: shoulder AA/ROM, A/ROM protraction    Consulted  and Agree with Plan of Care Patient             Patient will benefit from skilled therapeutic  intervention in order to improve the following deficits and impairments:   Body Structure / Function / Physical Skills: ADL, Endurance, UE functional use, Fascial restriction, Pain, ROM, IADL, Strength       Visit Diagnosis: Chronic right shoulder pain  Stiffness of right shoulder, not elsewhere classified  Other symptoms and signs involving the musculoskeletal system    Problem List Patient Active Problem List   Diagnosis Date Noted   Impacted cerumen of both ears 12/01/2020   HTN (hypertension) 10/19/2020   HLD (hyperlipidemia) 10/19/2020   Hypothyroidism 10/19/2020   Primary osteoarthritis of right shoulder 10/19/2020   Macular pucker, left eye 08/27/2019   Macular retinoschisis, left 08/27/2019   Posterior vitreous detachment of both eyes 08/27/2019   Diabetes mellitus without complication (HCC) 08/27/2019   Iron deficiency anemia 04/06/2019    Ezra Sites, OTR/L  [phone] 12/06/2020, 8:10 AM  [facility] [address] [place], [place], [address] Phone: [phone]   Fax:  [phone]  Name: ARAIYA TILMON MRN: [id number] Date of Birth: April 10, 1940

## 2020-12-07 ENCOUNTER — Encounter (HOSPITAL_COMMUNITY): Payer: Self-pay | Admitting: Occupational Therapy

## 2020-12-07 ENCOUNTER — Ambulatory Visit (HOSPITAL_COMMUNITY): Payer: Medicare Other | Admitting: Occupational Therapy

## 2020-12-07 DIAGNOSIS — M25511 Pain in right shoulder: Secondary | ICD-10-CM

## 2020-12-07 DIAGNOSIS — M25611 Stiffness of right shoulder, not elsewhere classified: Secondary | ICD-10-CM

## 2020-12-07 DIAGNOSIS — G8929 Other chronic pain: Secondary | ICD-10-CM

## 2020-12-07 DIAGNOSIS — R29898 Other symptoms and signs involving the musculoskeletal system: Secondary | ICD-10-CM

## 2020-12-07 NOTE — Therapy (Signed)
[facility] [address] [place], [place], [address] Phone: [phone]   Fax:  [phone]  Occupational Therapy Treatment  Patient Details  Name: Lisa Beck MRN: [id number] Date of Birth: Jan 03, 1941 Referring Provider (OT): Dr. Darreld Mclean   Encounter Date: 12/07/2020   OT End of Session - 12/07/20 1025     Visit Number 3    Number of Visits 8    Date for OT Re-Evaluation 12/30/20    Authorization Type 1) Medicare A & B 2) Tricare for life    Progress Note Due on Visit 10    OT Start Time 0945    OT Stop Time 1023    OT Time Calculation (min) 38 min    Activity Tolerance Patient tolerated treatment well    Behavior During Therapy [facility] for tasks assessed/performed             Past Medical History:  Diagnosis Date   Arthritis    Diabetes mellitus without complication (HCC)    History of kidney stones    HOH (hard of hearing)    Hyperlipidemia    Hypertension    Hypothyroidism     Past Surgical History:  Procedure Laterality Date   COLONOSCOPY  2007   Dr. Karilyn Cota: diverticulosis   COLONOSCOPY WITH PROPOFOL N/A 04/24/2019   Dr. Jena Gauss: Diverticulosis in the sigmoid colon, two 5 to 9 mm polyps removed from the descending and ascending colon.  Pathology revealed both to be benign polyps with no adenomatous changes. No future surveillance colonoscopies due to age.   ESOPHAGOGASTRODUODENOSCOPY (EGD) WITH PROPOFOL N/A 04/24/2019   Dr. Jena Gauss: Small hiatal hernia, one gastric polyp benign.   POLYPECTOMY  04/24/2019   Procedure: POLYPECTOMY;  Surgeon: Corbin Ade, MD;  [facility];  Service: Endoscopy;;  ascending;    There were no vitals filed for this visit.   Subjective Assessment - 12/07/20 0944     Subjective  S: It's a little sore but not too bad.    Currently in Pain? Yes    Pain Score 4     Pain Location Shoulder    Pain Orientation Right    Pain Descriptors / Indicators Sore    Pain Type Chronic  pain    Pain Radiating Towards up to the neck    Pain Onset More than a month ago    Pain Frequency Intermittent    Aggravating Factors  certain movements, quick movements    Pain Relieving Factors rest, heating pad, OTC pain medication    Effect of Pain on Daily Activities mod to max effect on ADLs    Multiple Pain Sites No                [facility] OT Assessment - 12/07/20 0944       Assessment   Medical Diagnosis right shoulder pain      Precautions   Precautions None                      OT Treatments/Exercises (OP) - 12/07/20 0948       Exercises   Exercises Shoulder      Shoulder Exercises: Supine   Protraction PROM;5 reps;AROM;10 reps    Horizontal ABduction PROM;5 reps;AAROM;10 reps    External Rotation PROM;5 reps;AAROM;10 reps    Internal Rotation PROM;5 reps;AAROM;10 reps    Flexion PROM;5 reps;AAROM;10 reps    ABduction PROM;5 reps      Shoulder Exercises:  Standing   Protraction AROM;10 reps    Horizontal ABduction AAROM;10 reps    External Rotation AAROM;10 reps    Internal Rotation AAROM;10 reps    Flexion AAROM;10 reps    ABduction AAROM;10 reps    Extension Theraband;10 reps    Theraband Level (Shoulder Extension) Level 2 (Red)    Row Theraband;10 reps    Theraband Level (Shoulder Row) Level 2 (Red)      Shoulder Exercises: Pulleys   Flexion 1 minute    ABduction 1 minute      Shoulder Exercises: ROM/Strengthening   Proximal Shoulder Strengthening, Supine 10X each, no rest breaks    Other ROM/Strengthening Exercises pvc pipe slide, 10X flexion                      OT Short Term Goals - 12/06/20 0749       OT SHORT TERM GOAL #1   Title Pt will be provided with and educated on HEP to improve mobility required for RUE use as dominant during daily tasks.    Time 4    Period Weeks    Status On-going    Target Date 12/30/20      OT SHORT TERM GOAL #2   Title Pt will decrease pain in RUE to 3/10 or less to increase  ability to sleep without waking due to pain for 3+ hours or greater.    Time 4    Period Weeks    Status On-going      OT SHORT TERM GOAL #3   Title Pt will decrease RUE fascial restrictions to minimal amounts or less to improve ability to perform functional reaching tasks.    Time 4    Period Weeks    Status On-going      OT SHORT TERM GOAL #4   Title Pt will increase RUE A/ROM to [facility] to improve ability to reach overhead and behind back for dressing tasks.    Time 4    Period Weeks    Status On-going      OT SHORT TERM GOAL #5   Title Pt will increase RUE strength to 4/5 or greater to improve ability to lift items into and out of overhead cabinets.    Time 4    Period Weeks    Status On-going                      Plan - 12/07/20 1025     Clinical Impression Statement A: Continued with myofascial release to address fascial restrictions in RUE, passive stretching. Continued with AA/ROM supine and standing, added scapular strengthening, pulleys, and pvc pipe slide. Pt with mod fatigue at end of session, reports she is completing AA/ROM at home. Verbal cuing for form and technique.    Body Structure / Function / Physical Skills ADL;Endurance;UE functional use;Fascial restriction;Pain;ROM;IADL;Strength    Plan P: Progress to A/ROM in supine, trial retraction with scapular theraband    OT Home Exercise Plan eval: table slides; 9/20: shoulder AA/ROM, A/ROM protraction    Consulted and Agree with Plan of Care Patient             Patient will benefit from skilled therapeutic intervention in order to improve the following deficits and impairments:   Body Structure / Function / Physical Skills: ADL, Endurance, UE functional use, Fascial restriction, Pain, ROM, IADL, Strength       Visit Diagnosis: Chronic right shoulder pain  Stiffness of right  shoulder, not elsewhere classified  Other symptoms and signs involving the musculoskeletal system    Problem  List Patient Active Problem List   Diagnosis Date Noted   Impacted cerumen of both ears 12/01/2020   HTN (hypertension) 10/19/2020   HLD (hyperlipidemia) 10/19/2020   Hypothyroidism 10/19/2020   Primary osteoarthritis of right shoulder 10/19/2020   Macular pucker, left eye 08/27/2019   Macular retinoschisis, 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 [address] [place], [place], [address] Phone: [phone]   Fax:  [phone]  Name: SUZANA SOHAIL MRN: [id number] Date of Birth: [birth year]

## 2020-12-13 ENCOUNTER — Ambulatory Visit (HOSPITAL_COMMUNITY): Payer: Medicare Other

## 2020-12-13 ENCOUNTER — Other Ambulatory Visit: Payer: Self-pay

## 2020-12-13 ENCOUNTER — Encounter (HOSPITAL_COMMUNITY): Payer: Self-pay

## 2020-12-13 DIAGNOSIS — M25511 Pain in right shoulder: Secondary | ICD-10-CM | POA: Diagnosis not present

## 2020-12-13 DIAGNOSIS — R29898 Other symptoms and signs involving the musculoskeletal system: Secondary | ICD-10-CM

## 2020-12-13 DIAGNOSIS — G8929 Other chronic pain: Secondary | ICD-10-CM

## 2020-12-13 DIAGNOSIS — M25611 Stiffness of right shoulder, not elsewhere classified: Secondary | ICD-10-CM

## 2020-12-13 LAB — CBC WITH DIFFERENTIAL/PLATELET
Absolute Monocytes: 624 cells/uL (ref 200–950)
Basophils Absolute: 62 cells/uL (ref 0–200)
Basophils Relative: 0.8 %
Eosinophils Absolute: 164 cells/uL (ref 15–500)
Eosinophils Relative: 2.1 %
HCT: 40.1 % (ref 35.0–45.0)
Hemoglobin: 13.3 g/dL (ref 11.7–15.5)
Lymphs Abs: 2777 cells/uL (ref 850–3900)
MCH: 29.3 pg (ref 27.0–33.0)
MCHC: 33.2 g/dL (ref 32.0–36.0)
MCV: 88.3 fL (ref 80.0–100.0)
MPV: 9.9 fL (ref 7.5–12.5)
Monocytes Relative: 8 %
Neutro Abs: 4173 cells/uL (ref 1500–7800)
Neutrophils Relative %: 53.5 %
Platelets: 313 10*3/uL (ref 140–400)
RBC: 4.54 10*6/uL (ref 3.80–5.10)
RDW: 13 % (ref 11.0–15.0)
Total Lymphocyte: 35.6 %
WBC: 7.8 10*3/uL (ref 3.8–10.8)

## 2020-12-13 LAB — IRON,TIBC AND FERRITIN PANEL
%SAT: 32 % (calc) (ref 16–45)
Ferritin: 52 ng/mL (ref 16–288)
Iron: 105 ug/dL (ref 45–160)
TIBC: 330 mcg/dL (calc) (ref 250–450)

## 2020-12-13 NOTE — Therapy (Signed)
[facility] [address] [place], [place], [address] Phone: [phone]   Fax:  [phone]  Occupational Therapy Treatment  Patient Details  Name: Lisa Beck MRN: [id number] Date of Birth: Jul 04, 1940 Referring Provider (OT): Dr. Darreld Mclean   Encounter Date: 12/13/2020   OT End of Session - 12/13/20 0849     Visit Number 4    Number of Visits 8    Date for OT Re-Evaluation 12/30/20    Authorization Type 1) Medicare A & B 2) Tricare for life    Progress Note Due on Visit 10    OT Start Time 0815    OT Stop Time 0853    OT Time Calculation (min) 38 min    Activity Tolerance Patient tolerated treatment well    Behavior During Therapy [facility] for tasks assessed/performed             Past Medical History:  Diagnosis Date   Arthritis    Diabetes mellitus without complication (HCC)    History of kidney stones    HOH (hard of hearing)    Hyperlipidemia    Hypertension    Hypothyroidism     Past Surgical History:  Procedure Laterality Date   COLONOSCOPY  2007   Dr. Karilyn Cota: diverticulosis   COLONOSCOPY WITH PROPOFOL N/A 04/24/2019   Dr. Jena Gauss: Diverticulosis in the sigmoid colon, two 5 to 9 mm polyps removed from the descending and ascending colon.  Pathology revealed both to be benign polyps with no adenomatous changes. No future surveillance colonoscopies due to age.   ESOPHAGOGASTRODUODENOSCOPY (EGD) WITH PROPOFOL N/A 04/24/2019   Dr. Jena Gauss: Small hiatal hernia, one gastric polyp benign.   POLYPECTOMY  04/24/2019   Procedure: POLYPECTOMY;  Surgeon: Corbin Ade, MD;  [facility];  Service: Endoscopy;;  ascending;    There were no vitals filed for this visit.   Subjective Assessment - 12/13/20 0821     Subjective  S: I didn't do anything different so I don't know why it's more sore.    Currently in Pain? Yes    Pain Score 6     Pain Location Shoulder    Pain Orientation Right    Pain Descriptors /  Indicators Sore    Pain Type Chronic pain    Pain Radiating Towards up to neck    Pain Onset More than a month ago    Pain Frequency Constant    Aggravating Factors  certain movement, quick movements    Pain Relieving Factors rest, heating pad, OTC pain medication    Effect of Pain on Daily Activities mod-max effect    Multiple Pain Sites No                [facility] OT Assessment - 12/13/20 0835       Assessment   Medical Diagnosis right shoulder pain      Precautions   Precautions None                      OT Treatments/Exercises (OP) - 12/13/20 0833       Exercises   Exercises Shoulder      Shoulder Exercises: Supine   Protraction PROM;5 reps;AROM;10 reps    Horizontal ABduction PROM;5 reps    External Rotation PROM;5 reps;AROM;10 reps    Internal Rotation PROM;5 reps;AROM;10 reps    Flexion PROM;5 reps;AROM;10 reps    ABduction PROM;5 reps      Shoulder Exercises: Standing  Protraction AROM;10 reps    Horizontal ABduction AROM;10 reps    External Rotation AROM;10 reps    Internal Rotation AROM;10 reps    Flexion AROM;10 reps    Flexion Limitations to shoulder level    ABduction AROM;10 reps    ABduction Limitations to shoulder level    Extension Theraband;10 reps    Theraband Level (Shoulder Extension) Level 2 (Red)    Row Theraband;10 reps    Theraband Level (Shoulder Row) Level 2 (Red)    Retraction Theraband;10 reps    Theraband Level (Shoulder Retraction) Level 2 (Red)      Shoulder Exercises: Pulleys   Flexion 1 minute   standing   ABduction 1 minute   standing     Manual Therapy   Manual Therapy Myofascial release    Manual therapy comments completed separately from therapeutic exercises    Myofascial Release myofascial release and manual techniques to right upper arm, anterior shoulder, and trapezius regions to decrease pain and fascial restrictions and increase joint ROM                      OT Short Term Goals -  12/06/20 0749       OT SHORT TERM GOAL #1   Title Pt will be provided with and educated on HEP to improve mobility required for RUE use as dominant during daily tasks.    Time 4    Period Weeks    Status On-going    Target Date 12/30/20      OT SHORT TERM GOAL #2   Title Pt will decrease pain in RUE to 3/10 or less to increase ability to sleep without waking due to pain for 3+ hours or greater.    Time 4    Period Weeks    Status On-going      OT SHORT TERM GOAL #3   Title Pt will decrease RUE fascial restrictions to minimal amounts or less to improve ability to perform functional reaching tasks.    Time 4    Period Weeks    Status On-going      OT SHORT TERM GOAL #4   Title Pt will increase RUE A/ROM to [facility] to improve ability to reach overhead and behind back for dressing tasks.    Time 4    Period Weeks    Status On-going      OT SHORT TERM GOAL #5   Title Pt will increase RUE strength to 4/5 or greater to improve ability to lift items into and out of overhead cabinets.    Time 4    Period Weeks    Status On-going                      Plan - 12/13/20 0850     Clinical Impression Statement A: Popping and joint instability felt during passive and active horizontal abduction. Pt reports same sensation when completing scapular row with theraband. Provided VC for form and technique. Manual techniques completed to address moderate fascial restrictions located in the right upper arm and upper trapezius region.    Body Structure / Function / Physical Skills ADL;Endurance;UE functional use;Fascial restriction;Pain;ROM;IADL;Strength    Plan P: Measure for MD appointment. Refrain from horizontal abduction due to joint popping. Give A/ROM for HEP.    Consulted and Agree with Plan of Care Patient             Patient will benefit from skilled therapeutic intervention  in order to improve the following deficits and impairments:   Body Structure / Function / Physical  Skills: ADL, Endurance, UE functional use, Fascial restriction, Pain, ROM, IADL, Strength       Visit Diagnosis: Other symptoms and signs involving the musculoskeletal system  Stiffness of right shoulder, not elsewhere classified  Chronic right shoulder pain    Problem List Patient Active Problem List   Diagnosis Date Noted   Impacted cerumen of both ears 12/01/2020   HTN (hypertension) 10/19/2020   HLD (hyperlipidemia) 10/19/2020   Hypothyroidism 10/19/2020   Primary osteoarthritis of right shoulder 10/19/2020   Macular pucker, left eye 08/27/2019   Macular retinoschisis, 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

## 2020-12-15 ENCOUNTER — Encounter (HOSPITAL_COMMUNITY): Payer: Self-pay

## 2020-12-15 ENCOUNTER — Ambulatory Visit (HOSPITAL_COMMUNITY): Payer: Medicare Other

## 2020-12-15 ENCOUNTER — Ambulatory Visit (INDEPENDENT_AMBULATORY_CARE_PROVIDER_SITE_OTHER): Payer: Medicare Other | Admitting: Orthopaedic Surgery

## 2020-12-15 ENCOUNTER — Other Ambulatory Visit: Payer: Self-pay

## 2020-12-15 ENCOUNTER — Encounter: Payer: Self-pay | Admitting: Orthopaedic Surgery

## 2020-12-15 DIAGNOSIS — M25611 Stiffness of right shoulder, not elsewhere classified: Secondary | ICD-10-CM

## 2020-12-15 DIAGNOSIS — M25511 Pain in right shoulder: Secondary | ICD-10-CM | POA: Diagnosis not present

## 2020-12-15 DIAGNOSIS — R29898 Other symptoms and signs involving the musculoskeletal system: Secondary | ICD-10-CM

## 2020-12-15 DIAGNOSIS — G8929 Other chronic pain: Secondary | ICD-10-CM

## 2020-12-15 NOTE — Therapy (Signed)
[facility] [address] [place], [place], [address] Phone: [phone]   Fax:  [phone]  Occupational Therapy Treatment  Patient Details  Name: Lisa Beck MRN: [id number] Date of Birth: 07-03-1940 Referring Provider (OT): Dr. Darreld Mclean   Encounter Date: 12/15/2020   OT End of Session - 12/15/20 0845     Visit Number 5    Number of Visits 8    Date for OT Re-Evaluation 12/30/20    Authorization Type 1) Medicare A & B 2) Tricare for life    Progress Note Due on Visit 10    OT Start Time 0815    OT Stop Time 0853    OT Time Calculation (min) 38 min    Activity Tolerance Patient tolerated treatment well    Behavior During Therapy [facility] for tasks assessed/performed             Past Medical History:  Diagnosis Date   Arthritis    Diabetes mellitus without complication (HCC)    History of kidney stones    HOH (hard of hearing)    Hyperlipidemia    Hypertension    Hypothyroidism     Past Surgical History:  Procedure Laterality Date   COLONOSCOPY  2007   Dr. Karilyn Cota: diverticulosis   COLONOSCOPY WITH PROPOFOL N/A 04/24/2019   Dr. Jena Gauss: Diverticulosis in the sigmoid colon, two 5 to 9 mm polyps removed from the descending and ascending colon.  Pathology revealed both to be benign polyps with no adenomatous changes. No future surveillance colonoscopies due to age.   ESOPHAGOGASTRODUODENOSCOPY (EGD) WITH PROPOFOL N/A 04/24/2019   Dr. Jena Gauss: Small hiatal hernia, one gastric polyp benign.   POLYPECTOMY  04/24/2019   Procedure: POLYPECTOMY;  Surgeon: Corbin Ade, MD;  [facility];  Service: Endoscopy;;  ascending;    There were no vitals filed for this visit.   Subjective Assessment - 12/15/20 0835     Subjective  S: The only thing that I have found to help with the pain is Asprin.    Currently in Pain? Yes    Pain Score 8     Pain Location Shoulder    Pain Orientation Right    Pain Descriptors /  Indicators Sore    Pain Type Chronic pain    Pain Onset More than a month ago    Aggravating Factors  certain movements, quick movements, just because with any movement    Pain Relieving Factors rest, heating pad, OTC pain medication    Effect of Pain on Daily Activities mod-max effect    Multiple Pain Sites No                [facility] OT Assessment - 12/15/20 0823       Assessment   Medical Diagnosis right shoulder pain      Precautions   Precautions None      ROM / Strength   AROM / PROM / Strength AROM;PROM;Strength      Palpation   Palpation comment Min fascial restrictions palpated in right upper trapezius/levator region.      AROM   Overall AROM Comments Assessed seated, er/IR adducted    AROM Assessment Site Shoulder    Right Shoulder Flexion 96 Degrees   previuos: 90   Right Shoulder ABduction 100 Degrees   previous: 74   Right Shoulder Internal Rotation 90 Degrees   previous: same   Right Shoulder External Rotation 38 Degrees   previous: 28  PROM   Overall PROM Comments Assessed supine, er/IR adducted    PROM Assessment Site Shoulder    Right/Left Shoulder Right    Right Shoulder Flexion 130 Degrees   previous: 125   Right Shoulder ABduction 150 Degrees   previous: 112   Right Shoulder Internal Rotation 90 Degrees   previous: same   Right Shoulder External Rotation 50 Degrees   previous: 36     Strength   Overall Strength Comments Assessed seated, er/IR adducted    Strength Assessment Site Shoulder    Right/Left Shoulder Right    Right Shoulder Flexion 4/5   previous: 3-/5   Right Shoulder ABduction 4-/5   previous: 3-/5   Right Shoulder Internal Rotation 4+/5   previous: 3/5   Right Shoulder External Rotation 4+/5   previous: 3-/5                     OT Treatments/Exercises (OP) - 12/15/20 0836       Exercises   Exercises Shoulder      Shoulder Exercises: Standing   Protraction AROM;10 reps    Horizontal ABduction AROM;10 reps     External Rotation AROM;10 reps    Internal Rotation AROM;10 reps    Flexion AROM;10 reps    Flexion Limitations to shoulder level    ABduction Limitations to shoulder level    Extension Theraband;12 reps    Theraband Level (Shoulder Extension) Level 2 (Red)    Row Theraband;12 reps    Theraband Level (Shoulder Row) Level 2 (Red)    Retraction Theraband;12 reps    Theraband Level (Shoulder Retraction) Level 2 (Red)      Shoulder Exercises: ROM/Strengthening   Wall Wash 1'    Over Head Lace seated. started at highest reaching level and laced to bottom of chain    Other ROM/Strengthening Exercises pvc pipe slide, 12X flexion      Manual Therapy   Manual Therapy Myofascial release    Manual therapy comments completed separately from therapeutic exercises    Myofascial Release myofascial release and manual techniques to right upper arm, anterior shoulder, and trapezius regions to decrease pain and fascial restrictions and increase joint ROM                      OT Short Term Goals - 12/06/20 0749       OT SHORT TERM GOAL #1   Title Pt will be provided with and educated on HEP to improve mobility required for RUE use as dominant during daily tasks.    Time 4    Period Weeks    Status On-going    Target Date 12/30/20      OT SHORT TERM GOAL #2   Title Pt will decrease pain in RUE to 3/10 or less to increase ability to sleep without waking due to pain for 3+ hours or greater.    Time 4    Period Weeks    Status On-going      OT SHORT TERM GOAL #3   Title Pt will decrease RUE fascial restrictions to minimal amounts or less to improve ability to perform functional reaching tasks.    Time 4    Period Weeks    Status On-going      OT SHORT TERM GOAL #4   Title Pt will increase RUE A/ROM to [facility] to improve ability to reach overhead and behind back for dressing tasks.    Time 4  Period Weeks    Status On-going      OT SHORT TERM GOAL #5   Title Pt will increase RUE  strength to 4/5 or greater to improve ability to lift items into and out of overhead cabinets.    Time 4    Period Weeks    Status On-going                      Plan - 12/15/20 1316     Clinical Impression Statement A: Measurement's taken for MD appintment this time. Patient continues to be limited by a high level of pain. She experiences popping and joint instability during typical movement especially horizontal abduction. Patient does present with increased ROM measurements passively and active. Myofascial release was completed to address min fascial restrictions located in the right upper trapezius region. VC for form and technique were provided during session.    Body Structure / Function / Physical Skills ADL;Endurance;UE functional use;Fascial restriction;Pain;ROM;IADL;Strength    Plan P: Follow up on MD appointment. Refrain from horizontal abduction if joint continues to pop. Update HEP to A/ROM.    Consulted and Agree with Plan of Care Patient             Patient will benefit from skilled therapeutic intervention in order to improve the following deficits and impairments:   Body Structure / Function / Physical Skills: ADL, Endurance, UE functional use, Fascial restriction, Pain, ROM, IADL, Strength       Visit Diagnosis: Stiffness of right shoulder, not elsewhere classified  Chronic right shoulder pain  Other symptoms and signs involving the musculoskeletal system    Problem List Patient Active Problem List   Diagnosis Date Noted   Impacted cerumen of both ears 12/01/2020   HTN (hypertension) 10/19/2020   HLD (hyperlipidemia) 10/19/2020   Hypothyroidism 10/19/2020   Primary osteoarthritis of right shoulder 10/19/2020   Macular pucker, left eye 08/27/2019   Macular retinoschisis, left 08/27/2019   Posterior vitreous detachment of both eyes 08/27/2019   Diabetes mellitus without complication (HCC) 08/27/2019   Iron deficiency anemia 04/06/2019     Limmie Patricia, OTR/L,CBIS  [phone]  12/15/2020, 2:29 PM  [facility] [address] [place], [place], [address] Phone: [phone]   Fax:  [phone]  Name: Lisa Beck MRN: [id number] Date of Birth: [birth year]

## 2020-12-15 NOTE — Progress Notes (Signed)
My shoulder still hurts.  She has been going to OT.  She was to have MRI but did not get it.  I have reviewed the OT notes. She is a little better but not that much.  ROM is good but painful in attempted full abduction and forward flexion.  NV intact.  Encounter Diagnosis  Name Primary?   Chronic right shoulder pain Yes   Get the MRI.  Continue OT.  Return in two weeks.  Call if any problem.  Precautions discussed.  Electronically Signed Darreld Mclean, MD 9/29/202210:19 AM

## 2020-12-20 ENCOUNTER — Ambulatory Visit (HOSPITAL_COMMUNITY): Payer: Medicare Other | Attending: Orthopaedic Surgery | Admitting: Occupational Therapy

## 2020-12-20 ENCOUNTER — Encounter (HOSPITAL_COMMUNITY): Payer: Self-pay | Admitting: Occupational Therapy

## 2020-12-20 ENCOUNTER — Other Ambulatory Visit: Payer: Self-pay

## 2020-12-20 DIAGNOSIS — M25611 Stiffness of right shoulder, not elsewhere classified: Secondary | ICD-10-CM | POA: Insufficient documentation

## 2020-12-20 DIAGNOSIS — M25511 Pain in right shoulder: Secondary | ICD-10-CM | POA: Diagnosis present

## 2020-12-20 DIAGNOSIS — G8929 Other chronic pain: Secondary | ICD-10-CM | POA: Insufficient documentation

## 2020-12-20 DIAGNOSIS — R29898 Other symptoms and signs involving the musculoskeletal system: Secondary | ICD-10-CM | POA: Diagnosis present

## 2020-12-20 NOTE — Therapy (Signed)
[facility] [address] [place], [place], [address] Phone: [phone]   Fax:  [phone]  Occupational Therapy Treatment  Patient Details  Name: Lisa Beck MRN: [id number] Date of Birth: 08-20-1940 Referring Provider (OT): Dr. Darreld Mclean   Encounter Date: 12/20/2020   OT End of Session - 12/20/20 1026     Visit Number 6    Number of Visits 8    Date for OT Re-Evaluation 12/30/20    Authorization Type 1) Medicare A & B 2) Tricare for life    Progress Note Due on Visit 10    OT Start Time [phone]    OT Stop Time 1026    OT Time Calculation (min) 39 min    Activity Tolerance Patient tolerated treatment well    Behavior During Therapy [facility] for tasks assessed/performed             Past Medical History:  Diagnosis Date   Arthritis    Diabetes mellitus without complication (HCC)    History of kidney stones    HOH (hard of hearing)    Hyperlipidemia    Hypertension    Hypothyroidism     Past Surgical History:  Procedure Laterality Date   COLONOSCOPY  2007   Dr. Karilyn Cota: diverticulosis   COLONOSCOPY WITH PROPOFOL N/A 04/24/2019   Dr. Jena Gauss: Diverticulosis in the sigmoid colon, two 5 to 9 mm polyps removed from the descending and ascending colon.  Pathology revealed both to be benign polyps with no adenomatous changes. No future surveillance colonoscopies due to age.   ESOPHAGOGASTRODUODENOSCOPY (EGD) WITH PROPOFOL N/A 04/24/2019   Dr. Jena Gauss: Small hiatal hernia, one gastric polyp benign.   POLYPECTOMY  04/24/2019   Procedure: POLYPECTOMY;  Surgeon: Corbin Ade, MD;  [facility];  Service: Endoscopy;;  ascending;    There were no vitals filed for this visit.   Subjective Assessment - 12/20/20 0946     Subjective  S: I haven't seen much difference with the cold.    Currently in Pain? Yes    Pain Score 6     Pain Location Shoulder    Pain Orientation Right    Pain Descriptors / Indicators Sore    Pain Type  Chronic pain    Pain Radiating Towards up to neck    Pain Onset More than a month ago    Aggravating Factors  certain movements    Pain Relieving Factors rest, heating pad, OTC pain medication    Effect of Pain on Daily Activities mod-max effect    Multiple Pain Sites No                          OT Treatments/Exercises (OP) - 12/20/20 0949       Exercises   Exercises Shoulder      Shoulder Exercises: Supine   Protraction PROM;5 reps;AROM;10 reps    Horizontal ABduction PROM;5 reps    External Rotation PROM;5 reps;AROM;10 reps    Internal Rotation PROM;5 reps;AROM;10 reps    Flexion PROM;5 reps;AROM;10 reps    ABduction PROM;5 reps      Shoulder Exercises: Standing   Protraction AROM;10 reps    Horizontal ABduction AROM;10 reps    External Rotation AROM;10 reps    Internal Rotation AROM;10 reps    Flexion AROM;10 reps    Flexion Limitations to shoulder level    ABduction AROM;10 reps    ABduction Limitations to shoulder level  Extension Theraband;12 reps    Theraband Level (Shoulder Extension) Level 2 (Red)    Row Theraband;12 reps    Theraband Level (Shoulder Row) Level 2 (Red)    Retraction Theraband;12 reps    Theraband Level (Shoulder Retraction) Level 2 (Red)      Shoulder Exercises: ROM/Strengthening   UBE (Upper Arm Bike) Level 1 2' forward 2' reverse, pace: 5.0    Wall Wash 1'    Over Head Lace 2' seated    Other ROM/Strengthening Exercises pvc pipe slide, 12X flexion      Functional Reaching Activities   High Level Pt placing 10 cones on middle shelf of overhead cabinet in flexion, removing in abduction      Manual Therapy   Manual Therapy Myofascial release    Manual therapy comments completed separately from therapeutic exercises    Myofascial Release myofascial release and manual techniques to right upper arm, anterior shoulder, and trapezius regions to decrease pain and fascial restrictions and increase joint ROM                     OT Education - 12/20/20 1010     Education Details shoulder A/ROM    Person(s) Educated Patient    Methods Explanation;Demonstration;Handout    Comprehension Verbalized understanding;Returned demonstration              OT Short Term Goals - 12/06/20 0749       OT SHORT TERM GOAL #1   Title Pt will be provided with and educated on HEP to improve mobility required for RUE use as dominant during daily tasks.    Time 4    Period Weeks    Status On-going    Target Date 12/30/20      OT SHORT TERM GOAL #2   Title Pt will decrease pain in RUE to 3/10 or less to increase ability to sleep without waking due to pain for 3+ hours or greater.    Time 4    Period Weeks    Status On-going      OT SHORT TERM GOAL #3   Title Pt will decrease RUE fascial restrictions to minimal amounts or less to improve ability to perform functional reaching tasks.    Time 4    Period Weeks    Status On-going      OT SHORT TERM GOAL #4   Title Pt will increase RUE A/ROM to [facility] to improve ability to reach overhead and behind back for dressing tasks.    Time 4    Period Weeks    Status On-going      OT SHORT TERM GOAL #5   Title Pt will increase RUE strength to 4/5 or greater to improve ability to lift items into and out of overhead cabinets.    Time 4    Period Weeks    Status On-going                      Plan - 12/20/20 1023     Clinical Impression Statement A: Pt reports she is now scheduled for an MRI. Continued with myofascial release and passive stretching, A/ROM and updated HEP provided. Continued with scapular theraband and overhead lacing, added functional reaching task and UBE. Verbal cuing for form and technique.    Body Structure / Function / Physical Skills ADL;Endurance;UE functional use;Fascial restriction;Pain;ROM;IADL;Strength    Plan P: Follow up on HEP, add proximal shoulder strengthening in standing  OT Home Exercise Plan eval: table slides;  9/20: shoulder AA/ROM, A/ROM protraction; 10/4: A/ROM    Consulted and Agree with Plan of Care Patient             Patient will benefit from skilled therapeutic intervention in order to improve the following deficits and impairments:   Body Structure / Function / Physical Skills: ADL, Endurance, UE functional use, Fascial restriction, Pain, ROM, IADL, Strength       Visit Diagnosis: Stiffness of right shoulder, not elsewhere classified  Chronic right shoulder pain  Other symptoms and signs involving the musculoskeletal system    Problem List Patient Active Problem List   Diagnosis Date Noted   Impacted cerumen of both ears 12/01/2020   HTN (hypertension) 10/19/2020   HLD (hyperlipidemia) 10/19/2020   Hypothyroidism 10/19/2020   Primary osteoarthritis of right shoulder 10/19/2020   Macular pucker, left eye 08/27/2019   Macular retinoschisis, 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 [place], [place], [address] Phone: [phone]   Fax:  [phone]  Name: Lisa Beck MRN: [id number] Date of Birth: [birth year]

## 2020-12-20 NOTE — Patient Instructions (Signed)

## 2020-12-22 ENCOUNTER — Other Ambulatory Visit: Payer: Self-pay

## 2020-12-22 ENCOUNTER — Ambulatory Visit (HOSPITAL_COMMUNITY): Payer: Medicare Other | Admitting: Occupational Therapy

## 2020-12-22 ENCOUNTER — Encounter (HOSPITAL_COMMUNITY): Payer: Self-pay | Admitting: Occupational Therapy

## 2020-12-22 DIAGNOSIS — M25611 Stiffness of right shoulder, not elsewhere classified: Secondary | ICD-10-CM | POA: Diagnosis not present

## 2020-12-22 DIAGNOSIS — G8929 Other chronic pain: Secondary | ICD-10-CM

## 2020-12-22 DIAGNOSIS — M25511 Pain in right shoulder: Secondary | ICD-10-CM

## 2020-12-22 DIAGNOSIS — R29898 Other symptoms and signs involving the musculoskeletal system: Secondary | ICD-10-CM

## 2020-12-22 NOTE — Therapy (Signed)
[place] [facility] [address] [place], [place], [address] Phone: [phone]   Fax:  [phone]  Occupational Therapy Treatment  Patient Details  Name: Lisa Beck MRN: [id number] Date of Birth: 1940-05-30 Referring Provider (OT): Dr. Darreld Mclean   Encounter Date: 12/22/2020   OT End of Session - 12/22/20 0852     Visit Number 7    Number of Visits 8    Date for OT Re-Evaluation 12/30/20    Authorization Type 1) Medicare A & B 2) Tricare for life    Progress Note Due on Visit 10    OT Start Time 0815    OT Stop Time 0856    OT Time Calculation (min) 41 min    Activity Tolerance Patient tolerated treatment well    Behavior During Therapy [facility] for tasks assessed/performed             Past Medical History:  Diagnosis Date   Arthritis    Diabetes mellitus without complication (HCC)    History of kidney stones    HOH (hard of hearing)    Hyperlipidemia    Hypertension    Hypothyroidism     Past Surgical History:  Procedure Laterality Date   COLONOSCOPY  2007   Dr. Karilyn Cota: diverticulosis   COLONOSCOPY WITH PROPOFOL N/A 04/24/2019   Dr. Jena Gauss: Diverticulosis in the sigmoid colon, two 5 to 9 mm polyps removed from the descending and ascending colon.  Pathology revealed both to be benign polyps with no adenomatous changes. No future surveillance colonoscopies due to age.   ESOPHAGOGASTRODUODENOSCOPY (EGD) WITH PROPOFOL N/A 04/24/2019   Dr. Jena Gauss: Small hiatal hernia, one gastric polyp benign.   POLYPECTOMY  04/24/2019   Procedure: POLYPECTOMY;  Surgeon: Corbin Ade, MD;  [facility];  Service: Endoscopy;;  ascending;    There were no vitals filed for this visit.   Subjective Assessment - 12/22/20 0814     Subjective  S: It's not too bad.    Currently in Pain? No/denies   just with lifting                         OT Treatments/Exercises (OP) - 12/22/20 0816       Exercises   Exercises  Shoulder      Shoulder Exercises: Supine   Protraction PROM;5 reps;AROM;10 reps    Horizontal ABduction PROM;5 reps    External Rotation PROM;5 reps;AROM;10 reps    Internal Rotation PROM;5 reps;AROM;10 reps    Flexion PROM;5 reps;AROM;10 reps    ABduction PROM;5 reps      Shoulder Exercises: Standing   Protraction AROM;10 reps    Horizontal ABduction AROM;10 reps    External Rotation AROM;10 reps    Internal Rotation AROM;10 reps    Flexion AROM;10 reps    Flexion Limitations to shoulder level    ABduction AROM;10 reps    ABduction Limitations to shoulder level    Extension Theraband;12 reps    Theraband Level (Shoulder Extension) Level 2 (Red)    Row Theraband;12 reps    Theraband Level (Shoulder Row) Level 2 (Red)    Retraction Theraband;12 reps    Theraband Level (Shoulder Retraction) Level 2 (Red)      Shoulder Exercises: ROM/Strengthening   UBE (Upper Arm Bike) Level 1 3' forward 3' reverse, pace: 4.5    Wall Wash 1'    Over Head Lace 2' seated    Proximal Shoulder Strengthening, Supine  10X each, no rest breaks    Proximal Shoulder Strengthening, Seated 10X each, no rest breaks      Manual Therapy   Manual Therapy Myofascial release    Manual therapy comments completed separately from therapeutic exercises    Myofascial Release myofascial release and manual techniques to right upper arm, anterior shoulder, and trapezius regions to decrease pain and fascial restrictions and increase joint ROM                      OT Short Term Goals - 12/06/20 0749       OT SHORT TERM GOAL #1   Title Pt will be provided with and educated on HEP to improve mobility required for RUE use as dominant during daily tasks.    Time 4    Period Weeks    Status On-going    Target Date 12/30/20      OT SHORT TERM GOAL #2   Title Pt will decrease pain in RUE to 3/10 or less to increase ability to sleep without waking due to pain for 3+ hours or greater.    Time 4    Period  Weeks    Status On-going      OT SHORT TERM GOAL #3   Title Pt will decrease RUE fascial restrictions to minimal amounts or less to improve ability to perform functional reaching tasks.    Time 4    Period Weeks    Status On-going      OT SHORT TERM GOAL #4   Title Pt will increase RUE A/ROM to [facility] to improve ability to reach overhead and behind back for dressing tasks.    Time 4    Period Weeks    Status On-going      OT SHORT TERM GOAL #5   Title Pt will increase RUE strength to 4/5 or greater to improve ability to lift items into and out of overhead cabinets.    Time 4    Period Weeks    Status On-going                      Plan - 12/22/20 0850     Clinical Impression Statement A: Continued with myofascial release and passive stretching, A/ROM in supine and standing. Pt with mild increase in pain with A/ROM tasks. Continued with scapular theraband and overhead lacing, increased time for UBE. Added proximal shoulder strengthening in standing. Verbal cuing for form and technique.    Body Structure / Function / Physical Skills ADL;Endurance;UE functional use;Fascial restriction;Pain;ROM;IADL;Strength    Plan P: Reassessment, determine if ready for discharge or need to hold for MRI    OT Home Exercise Plan eval: table slides; 9/20: shoulder AA/ROM, A/ROM protraction; 10/4: A/ROM    Consulted and Agree with Plan of Care Patient             Patient will benefit from skilled therapeutic intervention in order to improve the following deficits and impairments:   Body Structure / Function / Physical Skills: ADL, Endurance, UE functional use, Fascial restriction, Pain, ROM, IADL, Strength       Visit Diagnosis: Stiffness of right shoulder, not elsewhere classified  Chronic right shoulder pain  Other symptoms and signs involving the musculoskeletal system    Problem List Patient Active Problem List   Diagnosis Date Noted   Impacted cerumen of both ears  12/01/2020   HTN (hypertension) 10/19/2020   HLD (hyperlipidemia) 10/19/2020   Hypothyroidism  10/19/2020   Primary osteoarthritis of right shoulder 10/19/2020   Macular pucker, left eye 08/27/2019   Macular retinoschisis, 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 [place], [place], [address] Phone: [phone]   Fax:  [phone]  Name: Lisa Beck MRN: [id number] Date of Birth: [birth year]

## 2020-12-27 ENCOUNTER — Other Ambulatory Visit: Payer: Self-pay

## 2020-12-27 ENCOUNTER — Encounter (HOSPITAL_COMMUNITY): Payer: Self-pay | Admitting: Occupational Therapy

## 2020-12-27 ENCOUNTER — Ambulatory Visit (HOSPITAL_COMMUNITY): Payer: Medicare Other | Admitting: Occupational Therapy

## 2020-12-27 DIAGNOSIS — M25611 Stiffness of right shoulder, not elsewhere classified: Secondary | ICD-10-CM | POA: Diagnosis not present

## 2020-12-27 DIAGNOSIS — G8929 Other chronic pain: Secondary | ICD-10-CM

## 2020-12-27 DIAGNOSIS — R29898 Other symptoms and signs involving the musculoskeletal system: Secondary | ICD-10-CM

## 2020-12-27 NOTE — Patient Instructions (Signed)

## 2020-12-27 NOTE — Therapy (Addendum)
[place], [place], [address] Phone: [phone]   Fax:  [phone]  Occupational Therapy Reassessment & Treatment, Discharge  Patient Details  Name: Lisa Beck MRN: [id number] Date of Birth: 1940/08/23 Referring Provider (OT): Dr. Sanjuana Kava   Encounter Date: 12/27/2020   OT End of Session - 12/27/20 1027     Visit Number 8    Number of Visits 8    Date for OT Re-Evaluation 12/30/20    Authorization Type 1) Medicare A & B 2) [employer] for life    Progress Note Due on Visit 10    OT Start Time [phone]    OT Stop Time 1016    OT Time Calculation (min) 29 min    Activity Tolerance Patient tolerated treatment well    Behavior During Therapy [facility] for tasks assessed/performed             Past Medical History:  Diagnosis Date   Arthritis    Diabetes mellitus without complication ([place])    History of kidney stones    HOH (hard of hearing)    Hyperlipidemia    Hypertension    Hypothyroidism     Past Surgical History:  Procedure Laterality Date   COLONOSCOPY  2007   Dr. Laural Golden: diverticulosis   COLONOSCOPY WITH PROPOFOL N/A 04/24/2019   Dr. Gala Romney: Diverticulosis in the sigmoid colon, two 5 to 9 mm polyps removed from the descending and ascending colon.  Pathology revealed both to be benign polyps with no adenomatous changes. No future surveillance colonoscopies due to age.   ESOPHAGOGASTRODUODENOSCOPY (EGD) WITH PROPOFOL N/A 04/24/2019   Dr. Gala Romney: Small hiatal hernia, one gastric polyp benign.   POLYPECTOMY  04/24/2019   Procedure: POLYPECTOMY;  Surgeon: Daneil Dolin, MD;  [facility];  Service: Endoscopy;;  ascending;    There were no vitals filed for this visit.   Subjective Assessment - 12/27/20 0946     Subjective  S: It's been bothering me over the weekend.    Currently in Pain? Yes    Pain Score 6     Pain Location Shoulder    Pain Orientation Right    Pain Descriptors /  Indicators Sore    Pain Type Chronic pain    Pain Radiating Towards None    Pain Onset More than a month ago    Pain Frequency Intermittent    Aggravating Factors  unsure    Pain Relieving Factors rest, heating pad, OTC pain medication    Effect of Pain on Daily Activities mod effect on ADLs    Multiple Pain Sites No                [facility] OT Assessment - 12/27/20 0945       Assessment   Medical Diagnosis right shoulder pain      Precautions   Precautions None      Observation/Other Assessments   Focus on Therapeutic Outcomes (FOTO)  52/100   54/100 previous     AROM   Overall AROM Comments Assessed seated, er/IR adducted    AROM Assessment Site Shoulder    Right/Left Shoulder Right    Right Shoulder Flexion 106 Degrees   96 previous   Right Shoulder ABduction 113 Degrees   100 previous   Right Shoulder Internal Rotation 90 Degrees   same as previous   Right Shoulder External Rotation 30 Degrees   38 previous     PROM  Overall PROM Comments Assessed supine, er/IR adducted    PROM Assessment Site Shoulder    Right/Left Shoulder Right    Right Shoulder Flexion 145 Degrees   130 previous   Right Shoulder ABduction 150 Degrees   same as previous   Right Shoulder Internal Rotation 90 Degrees   same as previous   Right Shoulder External Rotation 55 Degrees   50 previous     Strength   Overall Strength Comments Assessed seated, er/IR adducted    Strength Assessment Site Shoulder    Right/Left Shoulder Right    Right Shoulder Flexion 4/5   same as previous   Right Shoulder ABduction 4/5   4-/5 previous   Right Shoulder Internal Rotation 4+/5   same as previous   Right Shoulder External Rotation 4+/5   same as previous                     OT Treatments/Exercises (OP) - 12/27/20 0949       Exercises   Exercises Shoulder      Shoulder Exercises: Supine   Protraction PROM;5 reps    Horizontal ABduction PROM;5 reps    External Rotation PROM;5 reps     Internal Rotation PROM;5 reps    Flexion PROM;5 reps    ABduction PROM;5 reps      Shoulder Exercises: Stretch   Corner Stretch 2 reps;10 seconds    Cross Chest Stretch 2 reps;10 seconds    Internal Rotation Stretch 2 reps   10" horizontal towel   External Rotation Stretch 2 reps;10 seconds    Wall Stretch - Flexion 2 reps;10 seconds      Manual Therapy   Manual Therapy Myofascial release    Manual therapy comments completed separately from therapeutic exercises    Myofascial Release myofascial release and manual techniques to right upper arm, anterior shoulder, and trapezius regions to decrease pain and fascial restrictions and increase joint ROM                    OT Education - 12/27/20 1007     Education Details shoulder stretches    Person(s) Educated Patient    Methods Explanation;Demonstration;Handout    Comprehension Verbalized understanding;Returned demonstration              OT Short Term Goals - 12/27/20 1331       OT SHORT TERM GOAL #1   Title Pt will be provided with and educated on HEP to improve mobility required for RUE use as dominant during daily tasks.    Time 4    Period Weeks    Status Achieved    Target Date 12/30/20      OT SHORT TERM GOAL #2   Title Pt will decrease pain in RUE to 3/10 or less to increase ability to sleep without waking due to pain for 3+ hours or greater.    Time 4    Period Weeks    Status Not Met      OT SHORT TERM GOAL #3   Title Pt will decrease RUE fascial restrictions to minimal amounts or less to improve ability to perform functional reaching tasks.    Time 4    Period Weeks    Status Achieved      OT SHORT TERM GOAL #4   Title Pt will increase RUE A/ROM to [facility] to improve ability to reach overhead and behind back for dressing tasks.    Time 4  Period Weeks    Status Not Met      OT SHORT TERM GOAL #5   Title Pt will increase RUE strength to 4/5 or greater to improve ability to lift items into and  out of overhead cabinets.    Time 4    Period Weeks    Status Achieved                      Plan - 12/27/20 1028     Clinical Impression Statement A: Reassessment completed this session, pt has met 2/5 goals and has made some progress towards improved ROM and strength. Pt reports she continues to have pain with use and pain increases with more frequent use. She continues to use the LUE for majority of tasks, especially lifting tasks. Pt reports she has an MRI scheduled for Wednesday and a follow up MD appt on 10/20 to review the MRI. Discussed progress with pt, pt agreeable to hold therapy until after her MD appt. If he instructs her to continue we will resume, however if he requests discharge we will do that. Educated on HEP for shoulder stretching, pt completing with good form and will add to HEP.    Body Structure / Function / Physical Skills ADL;Endurance;UE functional use;Fascial restriction;Pain;ROM;IADL;Strength    Plan P: Hold therapy until MRI and MD follow up.    OT Home Exercise Plan eval: table slides; 9/20: shoulder AA/ROM, A/ROM protraction; 10/4: A/ROM; 10/11: shoulder stretches    Consulted and Agree with Plan of Care Patient             Patient will benefit from skilled therapeutic intervention in order to improve the following deficits and impairments:   Body Structure / Function / Physical Skills: ADL, Endurance, UE functional use, Fascial restriction, Pain, ROM, IADL, Strength       Visit Diagnosis: Stiffness of right shoulder, not elsewhere classified  Chronic right shoulder pain  Other symptoms and signs involving the musculoskeletal system    Problem List Patient Active Problem List   Diagnosis Date Noted   Impacted cerumen of both ears 12/01/2020   HTN (hypertension) 10/19/2020   HLD (hyperlipidemia) 10/19/2020   Hypothyroidism 10/19/2020   Primary osteoarthritis of right shoulder 10/19/2020   Macular pucker, left eye 08/27/2019    Macular retinoschisis, 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 Chart reviewed-pt returned to MD after last appt and has decided to proceed with reverse TSA on 02/16/21. She will be discharged from this episode of care.    Remaining deficits: Pain, limited ROM, limited strength   Education / Equipment: HEP   Patient agrees to discharge. Patient goals were not met. Patient is being discharged due to she is having surgery and will return with new order post sx.

## 2020-12-28 ENCOUNTER — Ambulatory Visit (HOSPITAL_COMMUNITY)
Admission: RE | Admit: 2020-12-28 | Discharge: 2020-12-28 | Disposition: A | Discharge status: Home / Self Care | Payer: Medicare Other | Source: Ambulatory Visit | Attending: Orthopaedic Surgery | Admitting: Orthopaedic Surgery

## 2020-12-28 ENCOUNTER — Other Ambulatory Visit (HOSPITAL_COMMUNITY): Payer: Self-pay | Admitting: Internal Medicine

## 2020-12-28 DIAGNOSIS — G8929 Other chronic pain: Secondary | ICD-10-CM | POA: Insufficient documentation

## 2020-12-28 DIAGNOSIS — Z1231 Encounter for screening mammogram for malignant neoplasm of breast: Secondary | ICD-10-CM

## 2020-12-28 DIAGNOSIS — M25511 Pain in right shoulder: Secondary | ICD-10-CM | POA: Diagnosis not present

## 2020-12-29 ENCOUNTER — Encounter (HOSPITAL_COMMUNITY): Payer: Medicare Other | Admitting: Occupational Therapy

## 2021-01-04 ENCOUNTER — Ambulatory Visit (HOSPITAL_COMMUNITY): Payer: Medicare Other

## 2021-01-05 ENCOUNTER — Ambulatory Visit (INDEPENDENT_AMBULATORY_CARE_PROVIDER_SITE_OTHER): Payer: Medicare Other | Admitting: Orthopaedic Surgery

## 2021-01-05 ENCOUNTER — Encounter: Payer: Self-pay | Admitting: Orthopaedic Surgery

## 2021-01-05 ENCOUNTER — Other Ambulatory Visit: Payer: Self-pay

## 2021-01-05 DIAGNOSIS — G8929 Other chronic pain: Secondary | ICD-10-CM | POA: Diagnosis not present

## 2021-01-05 DIAGNOSIS — M25511 Pain in right shoulder: Secondary | ICD-10-CM | POA: Diagnosis not present

## 2021-01-05 NOTE — Progress Notes (Signed)
My shoulder still hurts  She had the MRI of the right shoulder showing: IMPRESSION: 1. Severe glenohumeral osteoarthritis with associated joint effusion and multiple intra-articular loose bodies. 2. Moderate rotator cuff tendinosis with low-grade tearing of the distal interdigitation of the supraspinatus and infraspinatus tendons. No full-thickness rotator cuff tear. 3. 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  She has pain most of the time.  She is very stoic and says she can put up with it but it is really bothering her.  AT times it seems to want to lock up then quickly releases.  It hurts.  I have explained the findings from the MRI.  I will have Dr. Dallas Schimke see her for further evaluation.  I have independently reviewed the MRI.    She can abduct to 90 with some pain but motion past that is painful and forward to 110 and then beyond is very painful.  Internal and external rotation are decreased.  NV intact.  ROM of neck is full.  Encounter Diagnosis  Name Primary?   Chronic right shoulder pain Yes   See Dr. Dallas Schimke for further evaluation.  Call if any problem.  Precautions discussed.  Electronically Signed Darreld Mclean, MD 10/20/20229:21 AM

## 2021-01-09 ENCOUNTER — Ambulatory Visit (INDEPENDENT_AMBULATORY_CARE_PROVIDER_SITE_OTHER): Payer: Medicare Other | Admitting: Orthopedic Surgery

## 2021-01-09 ENCOUNTER — Other Ambulatory Visit: Payer: Self-pay

## 2021-01-09 ENCOUNTER — Encounter: Payer: Self-pay | Admitting: Orthopedic Surgery

## 2021-01-09 DIAGNOSIS — M19011 Primary osteoarthritis, right shoulder: Secondary | ICD-10-CM | POA: Diagnosis not present

## 2021-01-09 NOTE — Progress Notes (Signed)
New Patient Visit  Assessment: Lisa Beck is a 80 y.o. female with the following: 1. Arthritis of right glenohumeral joint  Plan: Patient has chronic right shoulder pain.  Radiographs and MRI demonstrates advanced degenerative changes.  She has tried medications, PT and a subacromial injection.  This has not provided sustained relief.  She has pain and limited function.  Her rotator cuff is irritated but intact.  However, based on the MRI, she has a B2 glenoid.  As such, I think she will benefit from a reverse shoulder arthroplasty.  The procedure was discussed in great detail.  All questions were answered.  She will obtain medical clearance and then we will schedule a CT scan for preoperative planning.  I will see her in clinic to finalize a surgical plan and select a surgical date.    Follow-up: Return for After medical clearance for OR.  Subjective:  Chief Complaint  Patient presents with   Shoulder Pain    RIGHT shoulder/ referred by Dr.Keeling    History of Present Illness: Lisa Beck is a 80 y.o. RHD female who presents for evaluation of right shoulder pain.  She has seen Dr. Hilda Lias.  Pain has been progressively worsening.  No specific injury.  Pain is deep in the shoulder and radiates distally.  She has limited use of the right shoulder and has been relying on her left shoulder.  She has tried tylenol, with limited improvement.  She has worked with PT.  A steroid injection did not help.  She is frustrated with the pain and is interested in options if they can improve her pain.    Review of Systems: No fevers or chills No numbness or tingling No chest pain No shortness of breath No bowel or bladder dysfunction No GI distress No headaches   Medical History:  Past Medical History:  Diagnosis Date   Arthritis    Diabetes mellitus without complication ([facility])    History of kidney stones    HOH (hard of hearing)    Hyperlipidemia    Hypertension     Hypothyroidism     Past Surgical History:  Procedure Laterality Date   COLONOSCOPY  2007   Dr. Karilyn Cota: diverticulosis   COLONOSCOPY WITH PROPOFOL N/A 04/24/2019   Dr. Jena Gauss: Diverticulosis in the sigmoid colon, two 5 to 9 mm polyps removed from the descending and ascending colon.  Pathology revealed both to be benign polyps with no adenomatous changes. No future surveillance colonoscopies due to age.   ESOPHAGOGASTRODUODENOSCOPY (EGD) WITH PROPOFOL N/A 04/24/2019   Dr. Jena Gauss: Small hiatal hernia, one gastric polyp benign.   POLYPECTOMY  04/24/2019   Procedure: POLYPECTOMY;  Surgeon: Corbin Ade, MD;  [facility];  Service: Endoscopy;;  ascending;    Family History  Problem Relation Age of Onset   CAD Father        MI sometime in 37, age unknown   CAD Brother        Pt has 3 brothers that died of MIs between ages 21s-70s   CAD Brother    CAD Brother    Colon cancer Neg Hx    Ulcers Neg Hx    Social History   Tobacco Use   Smoking status: Never   Smokeless tobacco: Never  Vaping Use   Vaping Use: Never used  Substance Use Topics   Alcohol use: No   Drug use: No    No Known Allergies  Current Meds  Medication Sig  amLODipine (NORVASC) 5 MG tablet Take 1 tablet (5 mg total) by mouth daily.   aspirin EC 81 MG tablet Take 81 mg by mouth daily.    atorvastatin (LIPITOR) 40 MG tablet Take 40 mg by mouth every evening.   carbamide peroxide (DEBROX) 6.5 % OTIC solution Place 5 drops into both ears 2 (two) times daily.   Dulaglutide (TRULICITY) 0.75 MG/0.5ML SOPN Inject into the skin. Once a week   ferrous sulfate 324 (65 Fe) MG TBEC TAKE 1 TABLET BY MOUTH TWICE DAILY WITH A MEAL   insulin glargine (LANTUS) 100 UNIT/ML injection Inject 0.26 mLs (26 Units total) into the skin at bedtime.   levothyroxine (SYNTHROID) 112 MCG tablet Take 1 tablet (112 mcg total) by mouth daily before breakfast.   metFORMIN (GLUCOPHAGE) 500 MG tablet Take 500 mg by mouth 2 (two)  times daily.   metoprolol succinate (TOPROL-XL) 50 MG 24 hr tablet Take 50 mg by mouth daily. Take with or immediately following a meal.   telmisartan-hydrochlorothiazide (MICARDIS HCT) 40-12.5 MG per tablet Take 1 tablet by mouth daily.   VITAMIN D PO Take by mouth daily.    Objective: There were no vitals taken for this visit.  Physical Exam:  General: Alert and oriented. Gait: Normal gait.  Right shoulder without deformity.  No atrophy.  Forward flexion limited to 90 degrees. Abduction to 80 degrees due to pain.  Sensation intact in the axillary nerve distribution.  Sensation intact throughout the hand.  2+ radial pulse.    IMAGING: I personally reviewed images previously obtained in clinic  XR from October 2021 demonstrates advanced degenerative changes with complete loss of joint space in the superior aspect of the glenoid.  Subchondral sclerosis.  MRI of the right shoulder  IMPRESSION: 1. Severe glenohumeral osteoarthritis with associated joint effusion and multiple intra-articular loose bodies. 2. Moderate rotator cuff tendinosis with low-grade tearing of the distal interdigitation of the supraspinatus and infraspinatus tendons. No full-thickness rotator cuff tear. 3. 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    New Medications:  No orders of the defined types were placed in this encounter.     Oliver Barre, MD  01/09/2021 6:30 PM

## 2021-01-10 ENCOUNTER — Ambulatory Visit: Payer: Medicare Other | Admitting: Orthopedic Surgery

## 2021-01-23 ENCOUNTER — Ambulatory Visit (INDEPENDENT_AMBULATORY_CARE_PROVIDER_SITE_OTHER): Payer: Medicare Other | Admitting: Internal Medicine

## 2021-01-23 ENCOUNTER — Encounter: Payer: Self-pay | Admitting: Internal Medicine

## 2021-01-23 ENCOUNTER — Other Ambulatory Visit: Payer: Self-pay

## 2021-01-23 VITALS — BP 128/68 | HR 90 | Resp 18 | Ht 64.0 in | Wt 123.0 lb

## 2021-01-23 DIAGNOSIS — E1169 Type 2 diabetes mellitus with other specified complication: Secondary | ICD-10-CM | POA: Diagnosis not present

## 2021-01-23 DIAGNOSIS — I10 Essential (primary) hypertension: Secondary | ICD-10-CM | POA: Diagnosis not present

## 2021-01-23 DIAGNOSIS — Z01818 Encounter for other preprocedural examination: Secondary | ICD-10-CM | POA: Diagnosis not present

## 2021-01-23 DIAGNOSIS — M19011 Primary osteoarthritis, right shoulder: Secondary | ICD-10-CM

## 2021-01-23 DIAGNOSIS — Z794 Long term (current) use of insulin: Secondary | ICD-10-CM

## 2021-01-23 DIAGNOSIS — H6123 Impacted cerumen, bilateral: Secondary | ICD-10-CM | POA: Diagnosis not present

## 2021-01-23 DIAGNOSIS — E039 Hypothyroidism, unspecified: Secondary | ICD-10-CM

## 2021-01-23 MED ORDER — INSULIN GLARGINE 100 UNIT/ML ~~LOC~~ SOLN: 22.0000 [IU] | Freq: Every day | SUBCUTANEOUS | 1 refills | Status: DC

## 2021-01-23 NOTE — Assessment & Plan Note (Addendum)
Lab Results  Component Value Date   TSH 5.430 (H) 10/20/2020   On Levothyroxine 112 mcg QD Check TSH and free T4

## 2021-01-23 NOTE — Assessment & Plan Note (Addendum)
Planned for shoulder surgery  EKG: Atrial rhythm. No active signs of active ischemia.  Medically optimized with moderate risk for reverse shoulder arthroplasty.

## 2021-01-23 NOTE — Assessment & Plan Note (Signed)
Ear irritation done b/l Debrox ear drops in both ears No signs of infection currently Advised to avoid using sharp objects for cleaning purposes

## 2021-01-23 NOTE — Assessment & Plan Note (Signed)
With HTN  Lab Results  Component Value Date   HGBA1C 6.9 (H) 10/20/2020    On Lantus 26 U qHS, Trulicity 0.75 mg qw and Metformin 500 mg BID - decreased Lantus to 22 U qHS due to episodes of hypoglycemia in the morning Advised to follow diabetic diet On ARB and statin F/u HbA1C, CMP and lipid panel Diabetic eye exam: Advised to follow up with Ophthalmology for diabetic eye exam

## 2021-01-23 NOTE — Patient Instructions (Signed)
Please take Lantus only 22 units at nighttime.  Continue to take other medications as prescribed.  Okay to use Q-tip for cleaning purposes after applying Debrox eardrops.  Avoid using any sharp objects for cleaning.

## 2021-01-23 NOTE — Assessment & Plan Note (Signed)
BP Readings from Last 1 Encounters:  01/23/21 128/68   Well-controlled with Amlodipine, Micardis HCT and Metoprolol Counseled for compliance with the medications Advised DASH diet and moderate exercise/walking as tolerated

## 2021-01-23 NOTE — Progress Notes (Addendum)
Established Patient Office Visit  Subjective:  Patient ID: Lisa Beck, female    DOB: 1941-02-12  Age: 80 y.o. MRN: [id number]  CC:  Chief Complaint  Patient presents with   Follow-up    4 month follow up pt is supposed to have right shoulder surgery with dr Colon Branch needs ct ordered and blood work     HPI Lisa Beck is a 80 y.o. female with past medical history of hypertension, DM, hypothyroidism, HLD and macular pucker of left eye who presents for follow up of her chronic medical conditions and preop evaluation for shoulder surgery.  HTN: BP is well-controlled. Takes medications regularly. Patient denies headache, dizziness, chest pain, dyspnea or palpitations.   DM: She has been taking Lantus, Trulicity and metformin regularly.  Her blood glucose has been running low in the morning, around 70s at most of the time.  She denies any dizziness, polyuria or polyphagia.   Hypothyroidism: She takes levothyroxine regularly.  Denies any recent change in weight or appetite.  Denies any recent skin or hair changes.  She c/o b/l ear fullness, for which she has tried Debrox ear drops, but has not used anything to clean it. She has hearing difficulty, but denies any pain or discharge.  She is planned to have right shoulder surgery.   Past Medical History:  Diagnosis Date   Arthritis    Diabetes mellitus without complication ([place])    History of kidney stones    HOH (hard of hearing)    Hyperlipidemia    Hypertension    Hypothyroidism     Past Surgical History:  Procedure Laterality Date   COLONOSCOPY  2007   Dr. Laural Golden: diverticulosis   COLONOSCOPY WITH PROPOFOL N/A 04/24/2019   Dr. Gala Romney: Diverticulosis in the sigmoid colon, two 5 to 9 mm polyps removed from the descending and ascending colon.  Pathology revealed both to be benign polyps with no adenomatous changes. No future surveillance colonoscopies due to age.   ESOPHAGOGASTRODUODENOSCOPY (EGD) WITH PROPOFOL N/A 04/24/2019    Dr. Gala Romney: Small hiatal hernia, one gastric polyp benign.   POLYPECTOMY  04/24/2019   Procedure: POLYPECTOMY;  Surgeon: Daneil Dolin, MD;  [facility];  Service: Endoscopy;;  ascending;    Family History  Problem Relation Age of Onset   CAD Father        MI sometime in 70, age unknown   CAD Brother        Pt has 3 brothers that died of MIs between ages 77s-70s   CAD Brother    CAD Brother    Colon cancer Neg Hx    Ulcers Neg Hx     Social History   Socioeconomic History   Marital status: Married    Spouse name: Not on file   Number of children: Not on file   Years of education: Not on file   Highest education level: Not on file  Occupational History   Not on file  Tobacco Use   Smoking status: Never   Smokeless tobacco: Never  Vaping Use   Vaping Use: Never used  Substance and Sexual Activity   Alcohol use: No   Drug use: No   Sexual activity: Not Currently  Other Topics Concern   Not on file  Social History Narrative   Not on file   Social Determinants of Health   Financial Resource Strain: Not on file  Food Insecurity: Not on file  Transportation Needs: Not on file  Physical  Activity: Not on file  Stress: Not on file  Social Connections: Not on file  Intimate Partner Violence: Not on file    Outpatient Medications Prior to Visit  Medication Sig Dispense Refill   amLODipine (NORVASC) 5 MG tablet Take 1 tablet (5 mg total) by mouth daily. 90 tablet 1   aspirin EC 81 MG tablet Take 81 mg by mouth daily.      atorvastatin (LIPITOR) 40 MG tablet Take 40 mg by mouth every evening.     carbamide peroxide (DEBROX) 6.5 % OTIC solution Place 5 drops into both ears 2 (two) times daily. 15 mL 0   Dulaglutide (TRULICITY) 1.79 XT/0.5WP SOPN Inject into the skin. Once a week     ferrous sulfate 324 (65 Fe) MG TBEC TAKE 1 TABLET BY MOUTH TWICE DAILY WITH A MEAL 60 tablet 1   levothyroxine (SYNTHROID) 112 MCG tablet Take 1 tablet (112 mcg total)  by mouth daily before breakfast. 90 tablet 2   metFORMIN (GLUCOPHAGE) 500 MG tablet Take 500 mg by mouth 2 (two) times daily.     metoprolol succinate (TOPROL-XL) 50 MG 24 hr tablet Take 50 mg by mouth daily. Take with or immediately following a meal.     telmisartan-hydrochlorothiazide (MICARDIS HCT) 40-12.5 MG per tablet Take 1 tablet by mouth daily.     VITAMIN D PO Take by mouth daily.     insulin glargine (LANTUS) 100 UNIT/ML injection Inject 0.26 mLs (26 Units total) into the skin at bedtime. 10 mL 1   No facility-administered medications prior to visit.    No Known Allergies  ROS Review of Systems  Constitutional:  Negative for chills and fever.  HENT:  Positive for hearing loss (Right side). Negative for ear discharge, ear pain, sinus pressure, sinus pain and voice change.   Eyes:  Negative for pain and discharge.  Respiratory:  Negative for cough and shortness of breath.   Cardiovascular:  Negative for chest pain and palpitations.  Gastrointestinal:  Negative for nausea and vomiting.  Genitourinary:  Negative for dysuria and hematuria.  Musculoskeletal:  Negative for neck pain and neck stiffness.  Skin:  Negative for rash.  Neurological:  Negative for dizziness and weakness.  Psychiatric/Behavioral:  Negative for agitation and behavioral problems.      Objective:    Physical Exam Vitals reviewed.  Constitutional:      General: She is not in acute distress.    Appearance: She is not diaphoretic.  HENT:     Head: Normocephalic and atraumatic.     Right Ear: There is impacted cerumen.     Left Ear: There is impacted cerumen.     Nose: Nose normal.     Mouth/Throat:     Mouth: Mucous membranes are moist.  Eyes:     General: No scleral icterus.    Extraocular Movements: Extraocular movements intact.  Cardiovascular:     Rate and Rhythm: Normal rate and regular rhythm.     Pulses: Normal pulses.     Heart sounds: Normal heart sounds. No murmur heard. Pulmonary:      Breath sounds: Normal breath sounds. No wheezing or rales.  Abdominal:     Palpations: Abdomen is soft.     Tenderness: There is no abdominal tenderness.  Musculoskeletal:     Cervical back: Neck supple. No tenderness.     Right lower leg: No edema.     Left lower leg: No edema.  Skin:    General: Skin is warm.  Findings: No rash.  Neurological:     General: No focal deficit present.     Mental Status: She is alert and oriented to person, place, and time.     Sensory: No sensory deficit.     Motor: No weakness.  Psychiatric:        Mood and Affect: Mood normal.        Behavior: Behavior normal.    BP 128/68 (BP Location: Left Arm, Patient Position: Sitting, Cuff Size: Normal)   Pulse 90   Resp 18   Ht 5' 4"  (1.626 m)   Wt 123 lb (55.8 kg)   SpO2 99%   BMI 21.11 kg/m  Wt Readings from Last 3 Encounters:  01/23/21 123 lb (55.8 kg)  12/01/20 125 lb 1.3 oz (56.7 kg)  11/24/20 125 lb 9.6 oz (57 kg)    Lab Results  Component Value Date   TSH 5.430 (H) 10/20/2020   Lab Results  Component Value Date   WBC 7.8 12/12/2020   HGB 13.3 12/12/2020   HCT 40.1 12/12/2020   MCV 88.3 12/12/2020   PLT 313 12/12/2020   Lab Results  Component Value Date   NA 141 10/20/2020   K 4.6 10/20/2020   CO2 22 10/20/2020   GLUCOSE 94 10/20/2020   BUN 18 10/20/2020   CREATININE 0.81 10/20/2020   BILITOT 0.6 10/20/2020   ALKPHOS 102 10/20/2020   AST 18 10/20/2020   ALT 18 10/20/2020   PROT 6.4 10/20/2020   ALBUMIN 4.2 10/20/2020   CALCIUM 10.6 (H) 10/20/2020   ANIONGAP 8 04/22/2019   EGFR 73 10/20/2020   Lab Results  Component Value Date   CHOL 136 10/20/2020   Lab Results  Component Value Date   HDL 67 10/20/2020   Lab Results  Component Value Date   LDLCALC 51 10/20/2020   Lab Results  Component Value Date   TRIG 97 10/20/2020   Lab Results  Component Value Date   CHOLHDL 2.0 10/20/2020   Lab Results  Component Value Date   HGBA1C 6.9 (H) 10/20/2020       Assessment & Plan:   Problem List Items Addressed This Visit       Cardiovascular and Mediastinum   HTN (hypertension)    BP Readings from Last 1 Encounters:  01/23/21 128/68  Well-controlled with Amlodipine, Micardis HCT and Metoprolol Counseled for compliance with the medications Advised DASH diet and moderate exercise/walking as tolerated      Relevant Orders   CBC with Differential/Platelet     Endocrine   Type 2 diabetes mellitus with other specified complication ([place]) - Primary    With HTN  Lab Results  Component Value Date   HGBA1C 6.9 (H) 10/20/2020    On Lantus 26 U qHS, Trulicity 6.59 mg qw and Metformin 500 mg BID - decreased Lantus to 22 U qHS due to episodes of hypoglycemia in the morning Advised to follow diabetic diet On ARB and statin F/u HbA1C, CMP and lipid panel Diabetic eye exam: Advised to follow up with Ophthalmology for diabetic eye exam      Relevant Medications   insulin glargine (LANTUS) 100 UNIT/ML injection   Other Relevant Orders   CMP14+EGFR   HgB A1c   Hypothyroidism    Lab Results  Component Value Date   TSH 5.430 (H) 10/20/2020  On Levothyroxine 112 mcg QD Check TSH and free T4      Relevant Orders   TSH + free T4  Nervous and Auditory   Impacted cerumen of both ears    Ear irritation done b/l Debrox ear drops in both ears No signs of infection currently Advised to avoid using sharp objects for cleaning purposes        Musculoskeletal and Integument   Primary osteoarthritis of right shoulder    Planned for shoulder surgery  EKG: Atrial rhythm. No active signs of active ischemia.  Medically optimized with moderate risk for reverse shoulder arthroplasty.      Other Visit Diagnoses     Preoperative examination       Relevant Orders   CBC with Differential/Platelet   CMP14+EGFR   HgB A1c   EKG 12-Lead (Completed)       Meds ordered this encounter  Medications   insulin glargine (LANTUS) 100 UNIT/ML  injection    Sig: Inject 0.22 mLs (22 Units total) into the skin at bedtime.    Dispense:  10 mL    Refill:  1    Follow-up: Return in about 4 months (around 05/23/2021) for HTN and DM.    Lindell Spar, MD

## 2021-01-24 ENCOUNTER — Other Ambulatory Visit: Payer: Self-pay | Admitting: Internal Medicine

## 2021-01-24 ENCOUNTER — Ambulatory Visit (INDEPENDENT_AMBULATORY_CARE_PROVIDER_SITE_OTHER): Payer: Medicare Other | Admitting: *Deleted

## 2021-01-24 DIAGNOSIS — Z0000 Encounter for general adult medical examination without abnormal findings: Secondary | ICD-10-CM | POA: Diagnosis not present

## 2021-01-24 DIAGNOSIS — E039 Hypothyroidism, unspecified: Secondary | ICD-10-CM

## 2021-01-24 LAB — CBC WITH DIFFERENTIAL/PLATELET
Basophils Absolute: 0.1 10*3/uL (ref 0.0–0.2)
Basos: 1 %
EOS (ABSOLUTE): 0.2 10*3/uL (ref 0.0–0.4)
Eos: 2 %
Hematocrit: 40.2 % (ref 34.0–46.6)
Hemoglobin: 13.6 g/dL (ref 11.1–15.9)
Immature Grans (Abs): 0 10*3/uL (ref 0.0–0.1)
Immature Granulocytes: 0 %
Lymphocytes Absolute: 3.1 10*3/uL (ref 0.7–3.1)
Lymphs: 31 %
MCH: 29.4 pg (ref 26.6–33.0)
MCHC: 33.8 g/dL (ref 31.5–35.7)
MCV: 87 fL (ref 79–97)
Monocytes Absolute: 0.8 10*3/uL (ref 0.1–0.9)
Monocytes: 8 %
Neutrophils Absolute: 5.7 10*3/uL (ref 1.4–7.0)
Neutrophils: 58 %
Platelets: 342 10*3/uL (ref 150–450)
RBC: 4.62 x10E6/uL (ref 3.77–5.28)
RDW: 12.7 % (ref 11.7–15.4)
WBC: 9.9 10*3/uL (ref 3.4–10.8)

## 2021-01-24 LAB — CMP14+EGFR
ALT: 19 IU/L (ref 0–32)
AST: 18 IU/L (ref 0–40)
Albumin/Globulin Ratio: 2 (ref 1.2–2.2)
Albumin: 4.4 g/dL (ref 3.6–4.6)
Alkaline Phosphatase: 89 IU/L (ref 44–121)
BUN/Creatinine Ratio: 23 (ref 12–28)
BUN: 16 mg/dL (ref 8–27)
Bilirubin Total: 0.6 mg/dL (ref 0.0–1.2)
CO2: 23 mmol/L (ref 20–29)
Calcium: 10.8 mg/dL — ABNORMAL HIGH (ref 8.7–10.3)
Chloride: 102 mmol/L (ref 96–106)
Creatinine, Ser: 0.7 mg/dL (ref 0.57–1.00)
Globulin, Total: 2.2 g/dL (ref 1.5–4.5)
Glucose: 104 mg/dL — ABNORMAL HIGH (ref 70–99)
Potassium: 4.3 mmol/L (ref 3.5–5.2)
Sodium: 139 mmol/L (ref 134–144)
Total Protein: 6.6 g/dL (ref 6.0–8.5)
eGFR: 87 mL/min/{1.73_m2} (ref >59)

## 2021-01-24 LAB — HEMOGLOBIN A1C
Est. average glucose Bld gHb Est-mCnc: 146 mg/dL
Hgb A1c MFr Bld: 6.7 % — ABNORMAL HIGH (ref 4.8–5.6)

## 2021-01-24 LAB — TSH+FREE T4
Free T4: 2.75 ng/dL — ABNORMAL HIGH (ref 0.82–1.77)
TSH: 0.119 u[IU]/mL — ABNORMAL LOW (ref 0.450–4.500)

## 2021-01-24 MED ORDER — LEVOTHYROXINE SODIUM 100 MCG PO TABS: 100.0000 ug | ORAL_TABLET | Freq: Every day | ORAL | 1 refills | Status: DC

## 2021-01-24 NOTE — Progress Notes (Signed)
Subjective:   Lisa Beck is a 80 y.o. female who presents for Medicare Annual (Subsequent) preventive examination. I connected with  Lisa Beck on 01/24/21 by audio enabled telemedicine application and verified that I am speaking with the correct person using two identifiers.   I discussed the limitations of evaluation and management by telemedicine. The patient expressed understanding and agreed to proceed.   Review of Systems    NA       Objective:    There were no vitals filed for this visit. There is no height or weight on file to calculate BMI.  Advanced Directives 11/30/2020 04/22/2019 10/20/2017 06/15/2014  Does Patient Have a Medical Advance Directive? Yes Yes No No  Type of Estate agent of [place];Living will Healthcare Power of [place];Living will - -  Does patient want to make changes to medical advance directive? No - Patient declined No - Patient declined - -  Copy of Healthcare Power of Attorney in Chart? No - copy requested Yes - validated most recent copy scanned in chart (See row information) - -  Would patient like information on creating a medical advance directive? - No - Patient declined - No - patient declined information    Current Medications (verified) Outpatient Encounter Medications as of 01/24/2021  Medication Sig   amLODipine (NORVASC) 5 MG tablet Take 1 tablet (5 mg total) by mouth daily.   aspirin EC 81 MG tablet Take 81 mg by mouth daily.    atorvastatin (LIPITOR) 40 MG tablet Take 40 mg by mouth every evening.   carbamide peroxide (DEBROX) 6.5 % OTIC solution Place 5 drops into both ears 2 (two) times daily.   Dulaglutide (TRULICITY) 0.75 MG/0.5ML SOPN Inject into the skin. Once a week   ferrous sulfate 324 (65 Fe) MG TBEC TAKE 1 TABLET BY MOUTH TWICE DAILY WITH A MEAL   insulin glargine (LANTUS) 100 UNIT/ML injection Inject 0.22 mLs (22 Units total) into the skin at bedtime.   levothyroxine (SYNTHROID) 112 MCG tablet  Take 1 tablet (112 mcg total) by mouth daily before breakfast.   metFORMIN (GLUCOPHAGE) 500 MG tablet Take 500 mg by mouth 2 (two) times daily.   metoprolol succinate (TOPROL-XL) 50 MG 24 hr tablet Take 50 mg by mouth daily. Take with or immediately following a meal.   telmisartan-hydrochlorothiazide (MICARDIS HCT) 40-12.5 MG per tablet Take 1 tablet by mouth daily.   VITAMIN D PO Take by mouth daily.   No facility-administered encounter medications on file as of 01/24/2021.    Allergies (verified) Patient has no known allergies.   History: Past Medical History:  Diagnosis Date   Arthritis    Diabetes mellitus without complication ([facility])    History of kidney stones    HOH (hard of hearing)    Hyperlipidemia    Hypertension    Hypothyroidism    Past Surgical History:  Procedure Laterality Date   COLONOSCOPY  2007   Dr. Karilyn Cota: diverticulosis   COLONOSCOPY WITH PROPOFOL N/A 04/24/2019   Dr. Jena Gauss: Diverticulosis in the sigmoid colon, two 5 to 9 mm polyps removed from the descending and ascending colon.  Pathology revealed both to be benign polyps with no adenomatous changes. No future surveillance colonoscopies due to age.   ESOPHAGOGASTRODUODENOSCOPY (EGD) WITH PROPOFOL N/A 04/24/2019   Dr. Jena Gauss: Small hiatal hernia, one gastric polyp benign.   POLYPECTOMY  04/24/2019   Procedure: POLYPECTOMY;  Surgeon: Corbin Ade, MD;  [facility];  Service: Endoscopy;;  ascending;   Family History  Problem Relation Age of Onset   CAD Father        MI sometime in 83, age unknown   CAD Brother        Pt has 3 brothers that died of MIs between ages 23s-70s   CAD Brother    CAD Brother    Colon cancer Neg Hx    Ulcers Neg Hx    Social History   Socioeconomic History   Marital status: Married    Spouse name: Not on file   Number of children: Not on file   Years of education: Not on file   Highest education level: Not on file  Occupational History   Not on file   Tobacco Use   Smoking status: Never   Smokeless tobacco: Never  Vaping Use   Vaping Use: Never used  Substance and Sexual Activity   Alcohol use: No   Drug use: No   Sexual activity: Not Currently  Other Topics Concern   Not on file  Social History Narrative   Not on file   Social Determinants of Health   Financial Resource Strain: Not on file  Food Insecurity: Not on file  Transportation Needs: Not on file  Physical Activity: Not on file  Stress: Not on file  Social Connections: Not on file    Tobacco Counseling Counseling given: Not Answered   Clinical Intake:                 Diabetic?Yes         Activities of Daily Living In your present state of health, do you have any difficulty performing the following activities: 10/19/2020  Hearing? N  Vision? N  Difficulty concentrating or making decisions? N  Walking or climbing stairs? Y  Dressing or bathing? N  Doing errands, shopping? N  Some recent data might be hidden    Patient Care Team: Anabel Halon, MD as PCP - General (Internal Medicine) Jena Gauss Gerrit Friends, MD as Consulting Physician (Gastroenterology) Smitty Cords, OD (Optometry)  Indicate any recent Medical Services you may have received from other than [facility] providers in the past year (date may be approximate).     Assessment:   This is a routine wellness examination for [place].  Hearing/Vision screen No results found.  Dietary issues and exercise activities discussed:     Goals Addressed   None   Depression Screen PHQ 2/9 Scores 01/23/2021 12/01/2020 10/19/2020  PHQ - 2 Score 0 0 0    Fall Risk Fall Risk  01/23/2021 12/01/2020 10/19/2020  Falls in the past year? 0 0 0  Number falls in past yr: 0 0 0  Injury with Fall? 0 0 0  Risk for fall due to : No Fall Risks No Fall Risks No Fall Risks  Follow up Falls evaluation completed Falls evaluation completed Falls evaluation completed    FALL RISK PREVENTION PERTAINING TO THE  HOME:  Any stairs in or around the home? Yes  If so, are there any without handrails? No  Home free of loose throw rugs in walkways, pet beds, electrical cords, etc? Yes  Adequate lighting in your home to reduce risk of falls? Yes   ASSISTIVE DEVICES UTILIZED TO PREVENT FALLS:  Life alert? No  Use of a cane, Shavon Ashmore or w/c? No  Grab bars in the bathroom? Yes  Shower chair or bench in shower? No  Elevated toilet seat or a handicapped toilet? No   TIMED UP  AND GO:  Was the test performed? No .  Length of time to ambulate 10 feet: NA sec.     Cognitive Function:        Immunizations Immunization History  Administered Date(s) Administered   Fluad Quad(high Dose 65+) 12/01/2020   Moderna Sars-Covid-2 Vaccination 04/11/2019, 05/11/2019, 07/21/2020    TDAP status: Due, Education has been provided regarding the importance of this vaccine. Advised may receive this vaccine at local pharmacy or Health Dept. Aware to provide a copy of the vaccination record if obtained from local pharmacy or Health Dept. Verbalized acceptance and understanding.  Flu Vaccine status: Up to date  Pneumococcal vaccine status: Due, Education has been provided regarding the importance of this vaccine. Advised may receive this vaccine at local pharmacy or Health Dept. Aware to provide a copy of the vaccination record if obtained from local pharmacy or Health Dept. Verbalized acceptance and understanding.  Covid-19 vaccine status: Completed vaccines  Qualifies for Shingles Vaccine? Yes   Zostavax completed No   Shingrix Completed?: No.    Education has been provided regarding the importance of this vaccine. Patient has been advised to call insurance company to determine out of pocket expense if they have not yet received this vaccine. Advised may also receive vaccine at local pharmacy or Health Dept. Verbalized acceptance and understanding.  Screening Tests Health Maintenance  Topic Date Due   Pneumonia  Vaccine 78+ Years old (1 - PCV) Never done   TETANUS/TDAP  Never done   Zoster Vaccines- Shingrix (1 of 2) Never done   OPHTHALMOLOGY EXAM  09/15/2020   COVID-19 Vaccine (4 - Booster for Moderna series) 09/15/2020   HEMOGLOBIN A1C  07/23/2021   FOOT EXAM  10/19/2021   INFLUENZA VACCINE  Completed   DEXA SCAN  Completed   HPV VACCINES  Aged Out    Health Maintenance  Health Maintenance Due  Topic Date Due   Pneumonia Vaccine 36+ Years old (1 - PCV) Never done   TETANUS/TDAP  Never done   Zoster Vaccines- Shingrix (1 of 2) Never done   OPHTHALMOLOGY EXAM  09/15/2020   COVID-19 Vaccine (4 - Booster for Moderna series) 09/15/2020    Colorectal cancer screening: No longer required.   Mammogram status: Ordered 01/30/2021. Pt provided with contact info and advised to call to schedule appt.   Bone Density status: Completed 10/24/2020. Results reflect: Bone density results: OSTEOPOROSIS. Repeat every 2 years.  Lung Cancer Screening: (Low Dose CT Chest recommended if Age 2-80 years, 30 pack-year currently smoking OR have quit w/in 15years.) does not qualify.   Lung Cancer Screening Referral: NA  Additional Screening:  Hepatitis C Screening: does not qualify; Completed NA  Vision Screening: Recommended annual ophthalmology exams for early detection of glaucoma and other disorders of the eye. Is the patient up to date with their annual eye exam?  Yes  Who is the provider or what is the name of the office in which the patient attends annual eye exams? [facility]. Jonita Albee If pt is not established with a provider, would they like to be referred to a provider to establish care? No .   Dental Screening: Recommended annual dental exams for proper oral hygiene  Community Resource Referral / Chronic Care Management: CRR required this visit?  No   CCM required this visit?  No      Plan:     I have personally reviewed and noted the following in the patient's chart:   Medical and social  history Use of alcohol, tobacco or illicit drugs  Current medications and supplements including opioid prescriptions.  Functional ability and status Nutritional status Physical activity Advanced directives List of other physicians Hospitalizations, surgeries, and ER visits in previous 12 months Vitals Screenings to include cognitive, depression, and falls Referrals and appointments  In addition, I have reviewed and discussed with patient certain preventive protocols, quality metrics, and best practice recommendations. A written personalized care plan for preventive services as well as general preventive health recommendations were provided to patient.     Sydnee Levans, CMA   01/24/2021   Nurse Notes: This was a telehealth visit. The patient was at home, the provider was in the office. Trena Platt, MD

## 2021-01-24 NOTE — Patient Instructions (Signed)
Ms. Lisa Beck , Thank you for taking time to come for your Medicare Wellness Visit. I appreciate your ongoing commitment to your health goals. Please review the following plan we discussed and let me know if I can assist you in the future.   Screening recommendations/referrals: Colonoscopy: No longer required Mammogram: Scheduled for 01/30/2021 Bone Density: Completed 10/24/2020 Recommended yearly ophthalmology/optometry visit for glaucoma screening and checkup Recommended yearly dental visit for hygiene and checkup  Vaccinations: Influenza vaccine: Up to date Pneumococcal vaccine: Due now Tdap vaccine: Due now Shingles vaccine: Due now    Advanced directives: Information discussed  Conditions/risks identified: Diabetes, Hypertension  Next appointment: 1 year   Preventive Care 28 Years and Older, Female Preventive care refers to lifestyle choices and visits with your health care provider that can promote health and wellness. What does preventive care include? A yearly physical exam. This is also called an annual well check. Dental exams once or twice a year. Routine eye exams. Ask your health care provider how often you should have your eyes checked. Personal lifestyle choices, including: Daily care of your teeth and gums. Regular physical activity. Eating a healthy diet. Avoiding tobacco and drug use. Limiting alcohol use. Practicing safe sex. Taking low-dose aspirin every day. Taking vitamin and mineral supplements as recommended by your health care provider. What happens during an annual well check? The services and screenings done by your health care provider during your annual well check will depend on your age, overall health, lifestyle risk factors, and family history of disease. Counseling  Your health care provider may ask you questions about your: Alcohol use. Tobacco use. Drug use. Emotional well-being. Home and relationship well-being. Sexual activity. Eating  habits. History of falls. Memory and ability to understand (cognition). Work and work Astronomer. Reproductive health. Screening  You may have the following tests or measurements: Height, weight, and BMI. Blood pressure. Lipid and cholesterol levels. These may be checked every 5 years, or more frequently if you are over 71 years old. Skin check. Lung cancer screening. You may have this screening every year starting at age 56 if you have a 30-pack-year history of smoking and currently smoke or have quit within the past 15 years. Fecal occult blood test (FOBT) of the stool. You may have this test every year starting at age 63. Flexible sigmoidoscopy or colonoscopy. You may have a sigmoidoscopy every 5 years or a colonoscopy every 10 years starting at age 30. Hepatitis C blood test. Hepatitis B blood test. Sexually transmitted disease (STD) testing. Diabetes screening. This is done by checking your blood sugar (glucose) after you have not eaten for a while (fasting). You may have this done every 1-3 years. Bone density scan. This is done to screen for osteoporosis. You may have this done starting at age 19. Mammogram. This may be done every 1-2 years. Talk to your health care provider about how often you should have regular mammograms. Talk with your health care provider about your test results, treatment options, and if necessary, the need for more tests. Vaccines  Your health care provider may recommend certain vaccines, such as: Influenza vaccine. This is recommended every year. Tetanus, diphtheria, and acellular pertussis (Tdap, Td) vaccine. You may need a Td booster every 10 years. Zoster vaccine. You may need this after age 35. Pneumococcal 13-valent conjugate (PCV13) vaccine. One dose is recommended after age 65. Pneumococcal polysaccharide (PPSV23) vaccine. One dose is recommended after age 20. Talk to your health care provider about which screenings and  vaccines you need and how  often you need them. This information is not intended to replace advice given to you by your health care provider. Make sure you discuss any questions you have with your health care provider. Document Released: 04/01/2015 Document Revised: 11/23/2015 Document Reviewed: 01/04/2015 Elsevier Interactive Patient Education  2017 [place] Prevention in the Home Falls can cause injuries. They can happen to people of all ages. There are many things you can do to make your home safe and to help prevent falls. What can I do on the outside of my home? Regularly fix the edges of walkways and driveways and fix any cracks. Remove anything that might make you trip as you walk through a door, such as a raised step or threshold. Trim any bushes or trees on the path to your home. Use bright outdoor lighting. Clear any walking paths of anything that might make someone trip, such as rocks or tools. Regularly check to see if handrails are loose or broken. Make sure that both sides of any steps have handrails. Any raised decks and porches should have guardrails on the edges. Have any leaves, snow, or ice cleared regularly. Use sand or salt on walking paths during winter. Clean up any spills in your garage right away. This includes oil or grease spills. What can I do in the bathroom? Use night lights. Install grab bars by the toilet and in the tub and shower. Do not use towel bars as grab bars. Use non-skid mats or decals in the tub or shower. If you need to sit down in the shower, use a plastic, non-slip stool. Keep the floor dry. Clean up any water that spills on the floor as soon as it happens. Remove soap buildup in the tub or shower regularly. Attach bath mats securely with double-sided non-slip rug tape. Do not have throw rugs and other things on the floor that can make you trip. What can I do in the bedroom? Use night lights. Make sure that you have a light by your bed that is easy to  reach. Do not use any sheets or blankets that are too big for your bed. They should not hang down onto the floor. Have a firm chair that has side arms. You can use this for support while you get dressed. Do not have throw rugs and other things on the floor that can make you trip. What can I do in the kitchen? Clean up any spills right away. Avoid walking on wet floors. Keep items that you use a lot in easy-to-reach places. If you need to reach something above you, use a strong step stool that has a grab bar. Keep electrical cords out of the way. Do not use floor polish or wax that makes floors slippery. If you must use wax, use non-skid floor wax. Do not have throw rugs and other things on the floor that can make you trip. What can I do with my stairs? Do not leave any items on the stairs. Make sure that there are handrails on both sides of the stairs and use them. Fix handrails that are broken or loose. Make sure that handrails are as long as the stairways. Check any carpeting to make sure that it is firmly attached to the stairs. Fix any carpet that is loose or worn. Avoid having throw rugs at the top or bottom of the stairs. If you do have throw rugs, attach them to the floor with carpet tape. Make  sure that you have a light switch at the top of the stairs and the bottom of the stairs. If you do not have them, ask someone to add them for you. What else can I do to help prevent falls? Wear shoes that: Do not have high heels. Have rubber bottoms. Are comfortable and fit you well. Are closed at the toe. Do not wear sandals. If you use a stepladder: Make sure that it is fully opened. Do not climb a closed stepladder. Make sure that both sides of the stepladder are locked into place. Ask someone to hold it for you, if possible. Clearly mark and make sure that you can see: Any grab bars or handrails. First and last steps. Where the edge of each step is. Use tools that help you move  around (mobility aids) if they are needed. These include: Canes. Walkers. Scooters. Crutches. Turn on the lights when you go into a dark area. Replace any light bulbs as soon as they burn out. Set up your furniture so you have a clear path. Avoid moving your furniture around. If any of your floors are uneven, fix them. If there are any pets around you, be aware of where they are. Review your medicines with your doctor. Some medicines can make you feel dizzy. This can increase your chance of falling. Ask your doctor what other things that you can do to help prevent falls. This information is not intended to replace advice given to you by your health care provider. Make sure you discuss any questions you have with your health care provider. Document Released: 12/30/2008 Document Revised: 08/11/2015 Document Reviewed: 04/09/2014 Elsevier Interactive Patient Education  2017 [employer].

## 2021-01-30 ENCOUNTER — Ambulatory Visit (HOSPITAL_COMMUNITY)
Admission: RE | Admit: 2021-01-30 | Discharge: 2021-01-30 | Disposition: A | Discharge status: Home / Self Care | Payer: Medicare Other | Source: Ambulatory Visit | Attending: Internal Medicine | Admitting: Internal Medicine

## 2021-01-30 ENCOUNTER — Encounter (HOSPITAL_COMMUNITY): Payer: Self-pay

## 2021-01-30 ENCOUNTER — Other Ambulatory Visit: Payer: Self-pay

## 2021-01-30 DIAGNOSIS — Z1231 Encounter for screening mammogram for malignant neoplasm of breast: Secondary | ICD-10-CM | POA: Insufficient documentation

## 2021-01-31 ENCOUNTER — Ambulatory Visit (INDEPENDENT_AMBULATORY_CARE_PROVIDER_SITE_OTHER): Payer: Medicare Other | Admitting: Orthopedic Surgery

## 2021-01-31 ENCOUNTER — Encounter: Payer: Self-pay | Admitting: Orthopedic Surgery

## 2021-01-31 VITALS — BP 136/72 | HR 87 | Ht 64.0 in | Wt 122.8 lb

## 2021-01-31 DIAGNOSIS — M19011 Primary osteoarthritis, right shoulder: Secondary | ICD-10-CM | POA: Diagnosis not present

## 2021-01-31 DIAGNOSIS — Z01818 Encounter for other preprocedural examination: Secondary | ICD-10-CM | POA: Diagnosis not present

## 2021-01-31 DIAGNOSIS — G8929 Other chronic pain: Secondary | ICD-10-CM

## 2021-01-31 NOTE — Progress Notes (Signed)
Orthopaedic Clinic Return  Assessment: Lisa Beck is a 80 y.o. female with the following: Right shoulder glenohumeral arthritis  Plan: Patient has severe pain, and limited range of motion in her right shoulder.  She is obtain preoperative clearance.  She is ready to proceed with right shoulder arthroplasty.  Based on her age, and possible deformity of the glenoid, I have recommended a reverse shoulder arthroplasty.  This was discussed once again in great detail.  Risks and benefits of the surgery, including, but not limited to infection, bleeding, persistent pain, need for further surgery, non-union, malunion, damage to surrounding structures and dislocation as well as more severe complications associated with anesthesia were discussed with the patient.  The patient has elected to proceed.   We will plan to proceed with right reverse shoulder arthroplasty, scheduled for February 16, 2021.  We have ordered a CT scan, which will be completed within the next week.  We will plan for overnight observation.  If she has any issues, I have asked her to contact clinic.  We will see her approximately 2 weeks following surgery.  We will plan for her to initiate physical therapy prior to her first postoperative visit.  A referral was placed for therapy as well.  Follow-up: Return for After surgery; 2 weeks postop, DOS 02/16/21.   Subjective:  Chief Complaint  Patient presents with   Shoulder Pain    Rt shoulder sx consult     History of Present Illness: Lisa Beck is a 80 y.o. female who returns to clinic for repeat evaluation of her right shoulder.  She has had progressively worsening right shoulder pain, which is associated with decreased range of motion and function.  She is interested in pursuing surgery.  She has obtained preoperative clearance.  She has reviewed all of the available literature, provided to her at the previous visit.  No change in her overall health.  Review of  Systems: No fevers or chills No numbness or tingling No chest pain No shortness of breath No bowel or bladder dysfunction No GI distress No headaches   Objective: BP 136/72   Pulse 87   Ht 5\' 4"  (1.626 m)   Wt 122 lb 12.8 oz (55.7 kg)   BMI 21.08 kg/m   Physical Exam:  Elderly female.  No acute distress.  Evaluation the right shoulder demonstrates no deformity.  No atrophy is appreciated.  She has severely limited range of motion due to pain.  Forward elevation to approximately 100 degrees, before it is too painful.  Difficulty getting to her lumbar spine.  Limited external rotation at her side.  Sensation is intact in the axillary nerve distribution.  Her deltoid fires.  Sensations intact in the superficial radial, median and ulnar nerve distribution.  Fingers are warm and well-perfused.  2+ radial pulse.  IMAGING: I personally ordered and reviewed the following images:  No new imaging obtained today.   , MD 01/31/2021 12:48 PM

## 2021-01-31 NOTE — H&P (View-Only) (Signed)
Orthopaedic Clinic Return  Assessment: Lisa Beck is a 80 y.o. female with the following: Right shoulder glenohumeral arthritis  Plan: Patient has severe pain, and limited range of motion in her right shoulder.  She is obtain preoperative clearance.  She is ready to proceed with right shoulder arthroplasty.  Based on her age, and possible deformity of the glenoid, I have recommended a reverse shoulder arthroplasty.  This was discussed once again in great detail.  Risks and benefits of the surgery, including, but not limited to infection, bleeding, persistent pain, need for further surgery, non-union, malunion, damage to surrounding structures and dislocation as well as more severe complications associated with anesthesia were discussed with the patient.  The patient has elected to proceed.   We will plan to proceed with right reverse shoulder arthroplasty, scheduled for February 16, 2021.  We have ordered a CT scan, which will be completed within the next week.  We will plan for overnight observation.  If she has any issues, I have asked her to contact clinic.  We will see her approximately 2 weeks following surgery.  We will plan for her to initiate physical therapy prior to her first postoperative visit.  A referral was placed for therapy as well.  Follow-up: Return for After surgery; 2 weeks postop, DOS 02/16/21.   Subjective:  Chief Complaint  Patient presents with   Shoulder Pain    Rt shoulder sx consult     History of Present Illness: Lisa Beck is a 80 y.o. female who returns to clinic for repeat evaluation of her right shoulder.  She has had progressively worsening right shoulder pain, which is associated with decreased range of motion and function.  She is interested in pursuing surgery.  She has obtained preoperative clearance.  She has reviewed all of the available literature, provided to her at the previous visit.  No change in her overall health.  Review of  Systems: No fevers or chills No numbness or tingling No chest pain No shortness of breath No bowel or bladder dysfunction No GI distress No headaches   Objective: BP 136/72   Pulse 87   Ht 5\' 4"  (1.626 m)   Wt 122 lb 12.8 oz (55.7 kg)   BMI 21.08 kg/m   Physical Exam:  Elderly female.  No acute distress.  Evaluation the right shoulder demonstrates no deformity.  No atrophy is appreciated.  She has severely limited range of motion due to pain.  Forward elevation to approximately 100 degrees, before it is too painful.  Difficulty getting to her lumbar spine.  Limited external rotation at her side.  Sensation is intact in the axillary nerve distribution.  Her deltoid fires.  Sensations intact in the superficial radial, median and ulnar nerve distribution.  Fingers are warm and well-perfused.  2+ radial pulse.  IMAGING: I personally ordered and reviewed the following images:  No new imaging obtained today.   , MD 01/31/2021 12:48 PM

## 2021-02-06 ENCOUNTER — Other Ambulatory Visit: Payer: Self-pay

## 2021-02-06 ENCOUNTER — Ambulatory Visit (HOSPITAL_BASED_OUTPATIENT_CLINIC_OR_DEPARTMENT_OTHER)
Admission: RE | Admit: 2021-02-06 | Discharge: 2021-02-06 | Disposition: A | Discharge status: Home / Self Care | Payer: Medicare Other | Source: Ambulatory Visit | Attending: Orthopedic Surgery | Admitting: Orthopedic Surgery

## 2021-02-06 ENCOUNTER — Encounter (HOSPITAL_BASED_OUTPATIENT_CLINIC_OR_DEPARTMENT_OTHER): Payer: Self-pay

## 2021-02-06 DIAGNOSIS — M19011 Primary osteoarthritis, right shoulder: Secondary | ICD-10-CM

## 2021-02-06 DIAGNOSIS — G8929 Other chronic pain: Secondary | ICD-10-CM

## 2021-02-06 DIAGNOSIS — M25511 Pain in right shoulder: Secondary | ICD-10-CM | POA: Diagnosis present

## 2021-02-13 NOTE — Patient Instructions (Addendum)
Lisa Beck  02/13/2021     @PREFPERIOPPHARMACY @   Your procedure is scheduled on 02/16/2021.   Report to 14/03/2020 at  [phone]  A.M.   Call this number if you have problems the morning of surgery:  [phone]   Remember:  Do not eat or drink after midnight.      Take 11 units of insulin the night before your procedure.    DO NOT take any medications for diabetes the morning of your procedure.    Take these medicines the morning of surgery with A SIP OF WATER                 amlodipine, levothyroxine, metoprolol.     Do not wear jewelry, make-up or nail polish.  Do not wear lotions, powders, or perfumes, or deodorant.  Do not shave 48 hours prior to surgery.  Men may shave face and neck.  Do not bring valuables to the hospital.  [facility] is not responsible for any belongings or valuables.  Contacts, dentures or bridgework may not be worn into surgery.  Leave your suitcase in the car.  After surgery it may be brought to your room.  For patients admitted to the hospital, discharge time will be determined by your treatment team.  Patients discharged the day of surgery will not be allowed to drive home and must have someone with them for 24 hours.    Special instructions:    DO NOT smoke tobacco or vape for 24 hours before you come.  Please read over the following fact sheets that you were given. Coughing and Deep Breathing, Surgical Site Infection Prevention, Anesthesia Post-op Instructions, and Care and Recovery After Surgery      Reverse Total Shoulder Replacement, Care After This sheet gives you information about how to care for yourself after your procedure. Your health care provider may also give you more specific instructions. If you have problems or questions, contact your health care provider. What can I expect after the procedure? After the procedure, it is common to have: Pain in the shoulder and arm. Stiffness in the shoulder and  arm. Follow these instructions at home: Medicines Take over-the-counter and prescription medicines only as told by your health care provider. Ask your health care provider if the medicine prescribed to you: Requires you to avoid driving or using machinery. Can cause constipation. You may need to take these actions to prevent or treat constipation: Drink enough fluid to keep your urine pale yellow. Take over-the-counter or prescription medicines. Eat foods that are high in fiber, such as beans, whole grains, and fresh fruits and vegetables. Limit foods that are high in fat and processed sugars, such as fried or sweet foods. If you have a sling: Wear the sling as told by your health care provider. Remove it only as told by your health care provider. Check the skin around your sling every day. Tell your health care provider about any concerns. Loosen the sling if your fingers tingle, become numb, or turn cold and blue. Keep the sling clean and dry. Bathing Do not take baths, swim, or use a hot tub until your health care provider approves. Ask your health care provider if you may take showers. You may only be allowed to take sponge baths. If the sling is not waterproof: Do not let it get wet. Cover it with a watertight covering when you take a bath or shower. Keep your bandage (  dressing) dry until your health care provider says it can be removed. Incision care  Follow instructions from your health care provider about how to take care of your incision. Make sure you: Wash your hands with soap and water for at least 20 seconds before and after you change your dressing. If soap and water are not available, use hand sanitizer. Change your dressing as told by your health care provider. Leave stitches (sutures), skin glue, or adhesive strips in place. These skin closures may need to stay in place for 2 weeks or longer. If adhesive strip edges start to loosen and curl up, you may trim the loose  edges. Do not remove adhesive strips completely unless your health care provider tells you to do that. Check your incision area every day for signs of infection. Check for: More redness, swelling, or pain. Fluid or blood. Warmth. Pus or a bad smell. Managing pain, stiffness, and swelling  If directed, put ice on your shoulder. To do this: If you have a removable sling, remove it as told by your health care provider. Put ice in a plastic bag. Place a towel between your skin and the bag. Leave the ice on for 20 minutes, 2-3 times a day. Remove the ice if your skin turns bright red. This is very important. If you cannot feel pain, heat, or cold, you have a greater risk of damage to the area. Move your fingers and hand often to reduce stiffness and swelling. Driving If you were given a sedative during the procedure, it can affect you for several hours. Do not drive or operate machinery until your health care provider says that it is safe. Ask your health care provider when it is safe to drive if you have a sling on your arm. Activity Return to your normal activities as told by your health care provider. Ask your health care provider what activities are safe for you. Do shoulder exercises as told by your health care provider. Do not lift your arm above shoulder level until your health care provider approves. Do not make large movements with your arm. Do not push or pull things until your health care provider approves. Do not lift anything that is heavier than 5 lb (2.3 kg) until your health care provider says that it is safe. General instructions Do not use any products that contain nicotine or tobacco, such as cigarettes, e-cigarettes, and chewing tobacco. These can delay healing after surgery. If you need help quitting, ask your health care provider. Tell your health care provider if you plan to have dental work. Also: Tell your dentist about your joint replacement. Ask your health care  provider if there are any special instructions you need to follow before having dental care and routine cleanings. Keep all follow-up visits. This is important. Contact a health care provider if: You feel nauseous or you vomit. Your arm tingles or feels numb. Your pain gets worse, even after taking pain medicine. You have any of these signs of infection: More redness, swelling, or pain around your incision. Fluid or blood coming from your incision. Warmth coming from your incision. Pus or a bad smell coming from your incision. A fever. Get help right away if: Your shoulder joint moves out of place. Your incision comes apart. You have redness, swelling, pain, or warmth in your leg or arm. You have chest pain or shortness of breath. These symptoms may represent a serious problem that is an emergency. Do not wait to see if  the symptoms will go away. Get medical help right away. Call your local emergency services (911 in the [place] Do not drive yourself to the hospital. Summary After the procedure, it is common to have some pain and stiffness. Take over-the-counter and prescription medicines only as told by your health care provider. Keep your bandage (dressing) dry until your health care provider says it can be removed. Know the symptoms that should prompt you to contact your health care provider. Do shoulder exercises as told by your health care provider. Ask your health care provider what activities are safe for you. This information is not intended to replace advice given to you by your health care provider. Make sure you discuss any questions you have with your health care provider. Document Revised: 08/19/2019 Document Reviewed: 08/19/2019 Elsevier Patient Education  2022 [employer] General Anesthesia, Adult, Care After This sheet gives you information about how to care for yourself after your procedure. Your health care provider may also give you more specific instructions. If you  have problems or questions, contact your health care provider. What can I expect after the procedure? After the procedure, the following side effects are common: Pain or discomfort at the IV site. Nausea. Vomiting. Sore throat. Trouble concentrating. Feeling cold or chills. Feeling weak or tired. Sleepiness and fatigue. Soreness and body aches. These side effects can affect parts of the body that were not involved in surgery. Follow these instructions at home: For the time period you were told by your health care provider:  Rest. Do not participate in activities where you could fall or become injured. Do not drive or use machinery. Do not drink alcohol. Do not take sleeping pills or medicines that cause drowsiness. Do not make important decisions or sign legal documents. Do not take care of children on your own. Eating and drinking Follow any instructions from your health care provider about eating or drinking restrictions. When you feel hungry, start by eating small amounts of foods that are soft and easy to digest (bland), such as toast. Gradually return to your regular diet. Drink enough fluid to keep your urine pale yellow. If you vomit, rehydrate by drinking water, juice, or clear broth. General instructions If you have sleep apnea, surgery and certain medicines can increase your risk for breathing problems. Follow instructions from your health care provider about wearing your sleep device: Anytime you are sleeping, including during daytime naps. While taking prescription pain medicines, sleeping medicines, or medicines that make you drowsy. Have a responsible adult stay with you for the time you are told. It is important to have someone help care for you until you are awake and alert. Return to your normal activities as told by your health care provider. Ask your health care provider what activities are safe for you. Take over-the-counter and prescription medicines only as told  by your health care provider. If you smoke, do not smoke without supervision. Keep all follow-up visits as told by your health care provider. This is important. Contact a health care provider if: You have nausea or vomiting that does not get better with medicine. You cannot eat or drink without vomiting. You have pain that does not get better with medicine. You are unable to pass urine. You develop a skin rash. You have a fever. You have redness around your IV site that gets worse. Get help right away if: You have difficulty breathing. You have chest pain. You have blood in your urine or stool, or you vomit  blood. Summary After the procedure, it is common to have a sore throat or nausea. It is also common to feel tired. Have a responsible adult stay with you for the time you are told. It is important to have someone help care for you until you are awake and alert. When you feel hungry, start by eating small amounts of foods that are soft and easy to digest (bland), such as toast. Gradually return to your regular diet. Drink enough fluid to keep your urine pale yellow. Return to your normal activities as told by your health care provider. Ask your health care provider what activities are safe for you. This information is not intended to replace advice given to you by your health care provider. Make sure you discuss any questions you have with your health care provider. Document Revised: 11/19/2019 Document Reviewed: 06/18/2019 Elsevier Patient Education  2022 [employer] How to Use Chlorhexidine for Bathing Chlorhexidine gluconate (CHG) is a germ-killing (antiseptic) solution that is used to clean the skin. It can get rid of the bacteria that normally live on the skin and can keep them away for about 24 hours. To clean your skin with CHG, you may be given: A CHG solution to use in the shower or as part of a sponge bath. A prepackaged cloth that contains CHG. Cleaning your skin with CHG  may help lower the risk for infection: While you are staying in the intensive care unit of the hospital. If you have a vascular access, such as a central line, to provide short-term or long-term access to your veins. If you have a catheter to drain urine from your bladder. If you are on a ventilator. A ventilator is a machine that helps you breathe by moving air in and out of your lungs. After surgery. What are the risks? Risks of using CHG include: A skin reaction. Hearing loss, if CHG gets in your ears and you have a perforated eardrum. Eye injury, if CHG gets in your eyes and is not rinsed out. The CHG product catching fire. Make sure that you avoid smoking and flames after applying CHG to your skin. Do not use CHG: If you have a chlorhexidine allergy or have previously reacted to chlorhexidine. On babies younger than 60 months of age. How to use CHG solution Use CHG only as told by your health care provider, and follow the instructions on the label. Use the full amount of CHG as directed. Usually, this is one bottle. During a shower Follow these steps when using CHG solution during a shower (unless your health care provider gives you different instructions): Start the shower. Use your normal soap and shampoo to wash your face and hair. Turn off the shower or move out of the shower stream. Pour the CHG onto a clean washcloth. Do not use any type of brush or rough-edged sponge. Starting at your neck, lather your body down to your toes. Make sure you follow these instructions: If you will be having surgery, pay special attention to the part of your body where you will be having surgery. Scrub this area for at least 1 minute. Do not use CHG on your head or face. If the solution gets into your ears or eyes, rinse them well with water. Avoid your genital area. Avoid any areas of skin that have broken skin, cuts, or scrapes. Scrub your back and under your arms. Make sure to wash skin  folds. Let the lather sit on your skin for 1-2 minutes  or as long as told by your health care provider. Thoroughly rinse your entire body in the shower. Make sure that all body creases and crevices are rinsed well. Dry off with a clean towel. Do not put any substances on your body afterward--such as powder, lotion, or perfume--unless you are told to do so by your health care provider. Only use lotions that are recommended by the manufacturer. Put on clean clothes or pajamas. If it is the night before your surgery, sleep in clean sheets.  During a sponge bath Follow these steps when using CHG solution during a sponge bath (unless your health care provider gives you different instructions): Use your normal soap and shampoo to wash your face and hair. Pour the CHG onto a clean washcloth. Starting at your neck, lather your body down to your toes. Make sure you follow these instructions: If you will be having surgery, pay special attention to the part of your body where you will be having surgery. Scrub this area for at least 1 minute. Do not use CHG on your head or face. If the solution gets into your ears or eyes, rinse them well with water. Avoid your genital area. Avoid any areas of skin that have broken skin, cuts, or scrapes. Scrub your back and under your arms. Make sure to wash skin folds. Let the lather sit on your skin for 1-2 minutes or as long as told by your health care provider. Using a different clean, wet washcloth, thoroughly rinse your entire body. Make sure that all body creases and crevices are rinsed well. Dry off with a clean towel. Do not put any substances on your body afterward--such as powder, lotion, or perfume--unless you are told to do so by your health care provider. Only use lotions that are recommended by the manufacturer. Put on clean clothes or pajamas. If it is the night before your surgery, sleep in clean sheets. How to use CHG prepackaged cloths Only use CHG  cloths as told by your health care provider, and follow the instructions on the label. Use the CHG cloth on clean, dry skin. Do not use the CHG cloth on your head or face unless your health care provider tells you to. When washing with the CHG cloth: Avoid your genital area. Avoid any areas of skin that have broken skin, cuts, or scrapes. Before surgery Follow these steps when using a CHG cloth to clean before surgery (unless your health care provider gives you different instructions): Using the CHG cloth, vigorously scrub the part of your body where you will be having surgery. Scrub using a back-and-forth motion for 3 minutes. The area on your body should be completely wet with CHG when you are done scrubbing. Do not rinse. Discard the cloth and let the area air-dry. Do not put any substances on the area afterward, such as powder, lotion, or perfume. Put on clean clothes or pajamas. If it is the night before your surgery, sleep in clean sheets.  For general bathing Follow these steps when using CHG cloths for general bathing (unless your health care provider gives you different instructions). Use a separate CHG cloth for each area of your body. Make sure you wash between any folds of skin and between your fingers and toes. Wash your body in the following order, switching to a new cloth after each step: The front of your neck, shoulders, and chest. Both of your arms, under your arms, and your hands. Your stomach and groin area, avoiding the  genitals. Your right leg and foot. Your left leg and foot. The back of your neck, your back, and your buttocks. Do not rinse. Discard the cloth and let the area air-dry. Do not put any substances on your body afterward--such as powder, lotion, or perfume--unless you are told to do so by your health care provider. Only use lotions that are recommended by the manufacturer. Put on clean clothes or pajamas. Contact a health care provider if: Your skin gets  irritated after scrubbing. You have questions about using your solution or cloth. You swallow any chlorhexidine. Call your local poison control center (403-009-7687 in the [place] Get help right away if: Your eyes itch badly, or they become very red or swollen. Your skin itches badly and is red or swollen. Your hearing changes. You have trouble seeing. You have swelling or tingling in your mouth or throat. You have trouble breathing. These symptoms may represent a serious problem that is an emergency. Do not wait to see if the symptoms will go away. Get medical help right away. Call your local emergency services (911 in the [place] Do not drive yourself to the hospital. Summary Chlorhexidine gluconate (CHG) is a germ-killing (antiseptic) solution that is used to clean the skin. Cleaning your skin with CHG may help to lower your risk for infection. You may be given CHG to use for bathing. It may be in a bottle or in a prepackaged cloth to use on your skin. Carefully follow your health care provider's instructions and the instructions on the product label. Do not use CHG if you have a chlorhexidine allergy. Contact your health care provider if your skin gets irritated after scrubbing. This information is not intended to replace advice given to you by your health care provider. Make sure you discuss any questions you have with your health care provider. Document Revised: 05/16/2020 Document Reviewed: 05/16/2020 Elsevier Patient Education  2022 [employer].

## 2021-02-14 ENCOUNTER — Encounter (HOSPITAL_COMMUNITY)
Admission: RE | Admit: 2021-02-14 | Discharge: 2021-02-14 | Disposition: A | Discharge status: Home / Self Care | Payer: Medicare Other | Source: Ambulatory Visit | Attending: Orthopedic Surgery | Admitting: Orthopedic Surgery

## 2021-02-14 ENCOUNTER — Other Ambulatory Visit (HOSPITAL_COMMUNITY)
Admission: RE | Admit: 2021-02-14 | Discharge: 2021-02-14 | Disposition: A | Discharge status: Home / Self Care | Payer: Medicare Other | Source: Ambulatory Visit | Attending: Orthopedic Surgery | Admitting: Orthopedic Surgery

## 2021-02-14 ENCOUNTER — Other Ambulatory Visit: Payer: Self-pay

## 2021-02-14 VITALS — BP 133/87 | HR 95 | Temp 97.8°F | Resp 18 | Ht 64.0 in | Wt 124.0 lb

## 2021-02-14 DIAGNOSIS — Z01812 Encounter for preprocedural laboratory examination: Secondary | ICD-10-CM | POA: Diagnosis present

## 2021-02-14 DIAGNOSIS — Z20822 Contact with and (suspected) exposure to covid-19: Secondary | ICD-10-CM | POA: Diagnosis not present

## 2021-02-14 DIAGNOSIS — Z01818 Encounter for other preprocedural examination: Secondary | ICD-10-CM

## 2021-02-15 ENCOUNTER — Encounter (HOSPITAL_COMMUNITY): Payer: Self-pay | Admitting: Orthopedic Surgery

## 2021-02-15 LAB — SARS CORONAVIRUS 2 (TAT 6-24 HRS): SARS Coronavirus 2: NEGATIVE

## 2021-02-15 NOTE — Anesthesia Preprocedure Evaluation (Signed)
Anesthesia Evaluation  Patient identified by MRN, date of birth, ID band Patient awake    Reviewed: Allergy & Precautions, H&P , NPO status , Patient's Chart, lab work & pertinent test results, reviewed documented beta blocker date and time   Airway Mallampati: II  TM Distance: >3 FB Neck ROM: full    Dental no notable dental hx.    Pulmonary neg pulmonary ROS,    Pulmonary exam normal breath sounds clear to auscultation       Cardiovascular Exercise Tolerance: Good hypertension, negative cardio ROS   Rhythm:regular Rate:Normal     Neuro/Psych negative neurological ROS  negative psych ROS   GI/Hepatic negative GI ROS, Neg liver ROS,   Endo/Other  diabetesHypothyroidism   Renal/GU negative Renal ROS  negative genitourinary   Musculoskeletal   Abdominal   Peds  Hematology  (+) Blood dyscrasia, anemia ,   Anesthesia Other Findings   Reproductive/Obstetrics negative OB ROS                             Anesthesia Physical Anesthesia Plan  ASA: 2  Anesthesia Plan: General and General ETT   Post-op Pain Management: Regional block   Induction:   PONV Risk Score and Plan: Ondansetron  Airway Management Planned:   Additional Equipment:   Intra-op Plan:   Post-operative Plan:   Informed Consent: I have reviewed the patients History and Physical, chart, labs and discussed the procedure including the risks, benefits and alternatives for the proposed anesthesia with the patient or authorized representative who has indicated his/her understanding and acceptance.     Dental Advisory Given  Plan Discussed with: CRNA  Anesthesia Plan Comments:         Anesthesia Quick Evaluation

## 2021-02-16 ENCOUNTER — Encounter (HOSPITAL_COMMUNITY)
Admission: RE | Disposition: A | Discharge status: Home / Self Care | Payer: Self-pay | Source: Ambulatory Visit | Attending: Orthopedic Surgery

## 2021-02-16 ENCOUNTER — Ambulatory Visit (HOSPITAL_COMMUNITY): Payer: Medicare Other

## 2021-02-16 ENCOUNTER — Encounter (HOSPITAL_COMMUNITY): Payer: Self-pay | Admitting: Orthopedic Surgery

## 2021-02-16 ENCOUNTER — Ambulatory Visit (HOSPITAL_COMMUNITY): Payer: Medicare Other | Admitting: Anesthesiology

## 2021-02-16 ENCOUNTER — Observation Stay (HOSPITAL_COMMUNITY)
Admission: RE | Admit: 2021-02-16 | Discharge: 2021-02-17 | Disposition: A | Discharge status: Home / Self Care | Payer: Medicare Other | Source: Ambulatory Visit | Attending: Orthopedic Surgery | Admitting: Orthopedic Surgery

## 2021-02-16 DIAGNOSIS — E119 Type 2 diabetes mellitus without complications: Secondary | ICD-10-CM | POA: Diagnosis not present

## 2021-02-16 DIAGNOSIS — Z79899 Other long term (current) drug therapy: Secondary | ICD-10-CM | POA: Insufficient documentation

## 2021-02-16 DIAGNOSIS — Z7984 Long term (current) use of oral hypoglycemic drugs: Secondary | ICD-10-CM | POA: Diagnosis not present

## 2021-02-16 DIAGNOSIS — M19011 Primary osteoarthritis, right shoulder: Principal | ICD-10-CM | POA: Insufficient documentation

## 2021-02-16 DIAGNOSIS — I10 Essential (primary) hypertension: Secondary | ICD-10-CM | POA: Insufficient documentation

## 2021-02-16 DIAGNOSIS — E039 Hypothyroidism, unspecified: Secondary | ICD-10-CM | POA: Diagnosis not present

## 2021-02-16 HISTORY — PX: REVERSE SHOULDER ARTHROPLASTY: SHX5054

## 2021-02-16 LAB — GLUCOSE, CAPILLARY
Glucose-Capillary: 97 mg/dL (ref 70–99)
Glucose-Capillary: 93 mg/dL (ref 70–99)
Glucose-Capillary: 213 mg/dL — ABNORMAL HIGH (ref 70–99)

## 2021-02-16 SURGERY — ARTHROPLASTY, SHOULDER, TOTAL, REVERSE
Anesthesia: General | Site: Shoulder | Laterality: Right

## 2021-02-16 MED ORDER — ONDANSETRON HCL 4 MG/2ML IJ SOLN: 4.0000 mg | Freq: Once | INTRAMUSCULAR | Status: DC | PRN

## 2021-02-16 MED ORDER — ONDANSETRON HCL 4 MG/2ML IJ SOLN: 4.0000 mg | Freq: Four times a day (QID) | INTRAMUSCULAR | Status: DC | PRN

## 2021-02-16 MED ORDER — CEFAZOLIN SODIUM-DEXTROSE 2-4 GM/100ML-% IV SOLN
2.0000 g | INTRAVENOUS | Status: AC
  Administered 2021-02-16: 2 g via INTRAVENOUS
  Filled 2021-02-16: qty 100

## 2021-02-16 MED ORDER — PROPOFOL 10 MG/ML IV BOLUS
INTRAVENOUS | Status: AC
  Filled 2021-02-16: qty 20

## 2021-02-16 MED ORDER — HYDROCHLOROTHIAZIDE 25 MG PO TABS
25.0000 mg | ORAL_TABLET | Freq: Every day | ORAL | Status: DC
  Administered 2021-02-16 – 2021-02-17 (×2): 25 mg via ORAL
  Filled 2021-02-16 (×2): qty 1

## 2021-02-16 MED ORDER — BUPIVACAINE-EPINEPHRINE (PF) 0.5% -1:200000 IJ SOLN
INTRAMUSCULAR | Status: AC
  Filled 2021-02-16: qty 30

## 2021-02-16 MED ORDER — PHENYLEPHRINE HCL-NACL 20-0.9 MG/250ML-% IV SOLN
INTRAVENOUS | Status: AC
  Filled 2021-02-16: qty 250

## 2021-02-16 MED ORDER — FENTANYL CITRATE PF 50 MCG/ML IJ SOSY: 25.0000 ug | PREFILLED_SYRINGE | INTRAMUSCULAR | Status: DC | PRN

## 2021-02-16 MED ORDER — IRBESARTAN 150 MG PO TABS
150.0000 mg | ORAL_TABLET | Freq: Every day | ORAL | Status: DC
  Administered 2021-02-16 – 2021-02-17 (×2): 150 mg via ORAL
  Filled 2021-02-16 (×2): qty 1

## 2021-02-16 MED ORDER — ONDANSETRON HCL 4 MG/2ML IJ SOLN
INTRAMUSCULAR | Status: AC
  Filled 2021-02-16: qty 4

## 2021-02-16 MED ORDER — AMLODIPINE BESYLATE 5 MG PO TABS
5.0000 mg | ORAL_TABLET | Freq: Every day | ORAL | Status: DC
  Administered 2021-02-17: 5 mg via ORAL
  Filled 2021-02-16: qty 1

## 2021-02-16 MED ORDER — INSULIN GLARGINE-YFGN 100 UNIT/ML ~~LOC~~ SOLN
11.0000 [IU] | Freq: Every day | SUBCUTANEOUS | Status: DC
  Administered 2021-02-16: 11 [IU] via SUBCUTANEOUS
  Filled 2021-02-16 (×4): qty 0.11

## 2021-02-16 MED ORDER — CEFAZOLIN SODIUM-DEXTROSE 2-4 GM/100ML-% IV SOLN
2.0000 g | Freq: Three times a day (TID) | INTRAVENOUS | Status: AC
  Administered 2021-02-16 – 2021-02-17 (×3): 2 g via INTRAVENOUS
  Filled 2021-02-16 (×3): qty 100

## 2021-02-16 MED ORDER — FENTANYL CITRATE (PF) 100 MCG/2ML IJ SOLN
INTRAMUSCULAR | Status: DC | PRN
  Administered 2021-02-16: 25 ug via INTRAVENOUS

## 2021-02-16 MED ORDER — ORAL CARE MOUTH RINSE: 15.0000 mL | Freq: Once | OROMUCOSAL | Status: DC

## 2021-02-16 MED ORDER — LIDOCAINE HCL (PF) 2 % IJ SOLN
INTRAMUSCULAR | Status: AC
  Filled 2021-02-16: qty 15

## 2021-02-16 MED ORDER — LEVOTHYROXINE SODIUM 100 MCG PO TABS
100.0000 ug | ORAL_TABLET | Freq: Every day | ORAL | Status: DC
  Administered 2021-02-17: 100 ug via ORAL
  Filled 2021-02-16: qty 1

## 2021-02-16 MED ORDER — CHLORHEXIDINE GLUCONATE 0.12 % MT SOLN
OROMUCOSAL | Status: AC
  Administered 2021-02-16: 15 mL
  Filled 2021-02-16: qty 15

## 2021-02-16 MED ORDER — PROPOFOL 10 MG/ML IV BOLUS
INTRAVENOUS | Status: DC | PRN
  Administered 2021-02-16: 120 mg via INTRAVENOUS

## 2021-02-16 MED ORDER — ROCURONIUM BROMIDE 10 MG/ML (PF) SYRINGE
PREFILLED_SYRINGE | INTRAVENOUS | Status: AC
  Filled 2021-02-16: qty 10

## 2021-02-16 MED ORDER — LIDOCAINE HCL (CARDIAC) PF 50 MG/5ML IV SOSY
PREFILLED_SYRINGE | INTRAVENOUS | Status: DC | PRN
  Administered 2021-02-16: 60 mg via INTRAVENOUS

## 2021-02-16 MED ORDER — PHENYLEPHRINE 40 MCG/ML (10ML) SYRINGE FOR IV PUSH (FOR BLOOD PRESSURE SUPPORT)
PREFILLED_SYRINGE | INTRAVENOUS | Status: AC
  Filled 2021-02-16: qty 10

## 2021-02-16 MED ORDER — FENTANYL CITRATE (PF) 100 MCG/2ML IJ SOLN
INTRAMUSCULAR | Status: AC
  Filled 2021-02-16: qty 2

## 2021-02-16 MED ORDER — ASPIRIN 81 MG PO CHEW
81.0000 mg | CHEWABLE_TABLET | Freq: Every day | ORAL | Status: DC
  Administered 2021-02-17: 81 mg via ORAL
  Filled 2021-02-16: qty 1

## 2021-02-16 MED ORDER — METFORMIN HCL 500 MG PO TABS
500.0000 mg | ORAL_TABLET | Freq: Two times a day (BID) | ORAL | Status: DC
  Administered 2021-02-16 – 2021-02-17 (×2): 500 mg via ORAL
  Filled 2021-02-16 (×2): qty 1

## 2021-02-16 MED ORDER — LACTATED RINGERS IV SOLN
INTRAVENOUS | Status: DC
  Administered 2021-02-16: 1000 mL via INTRAVENOUS

## 2021-02-16 MED ORDER — TRANEXAMIC ACID-NACL 1000-0.7 MG/100ML-% IV SOLN
1000.0000 mg | INTRAVENOUS | Status: AC
  Administered 2021-02-16: 1000 mg via INTRAVENOUS
  Filled 2021-02-16: qty 100

## 2021-02-16 MED ORDER — ROPIVACAINE HCL 5 MG/ML IJ SOLN
INTRAMUSCULAR | Status: DC | PRN
  Administered 2021-02-16: 30 mL via PERINEURAL

## 2021-02-16 MED ORDER — PHENYLEPHRINE HCL (PRESSORS) 10 MG/ML IV SOLN
INTRAVENOUS | Status: DC | PRN
  Administered 2021-02-16: 80 ug via INTRAVENOUS

## 2021-02-16 MED ORDER — FENTANYL CITRATE (PF) 100 MCG/2ML IJ SOLN
INTRAMUSCULAR | Status: DC | PRN
  Administered 2021-02-16: 50 ug via INTRAVENOUS

## 2021-02-16 MED ORDER — VANCOMYCIN HCL 1000 MG IV SOLR
INTRAVENOUS | Status: AC
  Filled 2021-02-16: qty 20

## 2021-02-16 MED ORDER — ATORVASTATIN CALCIUM 40 MG PO TABS
40.0000 mg | ORAL_TABLET | Freq: Every evening | ORAL | Status: DC
  Administered 2021-02-16: 40 mg via ORAL
  Filled 2021-02-16: qty 1

## 2021-02-16 MED ORDER — ROPIVACAINE HCL 5 MG/ML IJ SOLN
INTRAMUSCULAR | Status: AC
  Filled 2021-02-16: qty 30

## 2021-02-16 MED ORDER — CHLORHEXIDINE GLUCONATE CLOTH 2 % EX PADS
6.0000 | MEDICATED_PAD | Freq: Every day | CUTANEOUS | Status: DC
  Administered 2021-02-17: 6 via TOPICAL

## 2021-02-16 MED ORDER — TELMISARTAN-HCTZ 40-12.5 MG PO TABS: 1.0000 | ORAL_TABLET | Freq: Every day | ORAL | Status: DC

## 2021-02-16 MED ORDER — METOPROLOL SUCCINATE ER 50 MG PO TB24
50.0000 mg | ORAL_TABLET | Freq: Every day | ORAL | Status: DC
  Administered 2021-02-17: 50 mg via ORAL
  Filled 2021-02-16: qty 1

## 2021-02-16 MED ORDER — OXYCODONE HCL 5 MG PO TABS
10.0000 mg | ORAL_TABLET | ORAL | Status: DC | PRN
  Administered 2021-02-16 – 2021-02-17 (×5): 10 mg via ORAL
  Filled 2021-02-16 (×5): qty 2

## 2021-02-16 MED ORDER — OXYCODONE HCL 5 MG PO TABS: 5.0000 mg | ORAL_TABLET | ORAL | Status: DC | PRN

## 2021-02-16 MED ORDER — DIPHENHYDRAMINE HCL 12.5 MG/5ML PO ELIX: 12.5000 mg | ORAL_SOLUTION | ORAL | Status: DC | PRN

## 2021-02-16 MED ORDER — PHENYLEPHRINE HCL-NACL 20-0.9 MG/250ML-% IV SOLN
INTRAVENOUS | Status: DC | PRN
  Administered 2021-02-16: 20 ug/min via INTRAVENOUS

## 2021-02-16 MED ORDER — ONDANSETRON HCL 4 MG PO TABS: 4.0000 mg | ORAL_TABLET | Freq: Four times a day (QID) | ORAL | Status: DC | PRN

## 2021-02-16 MED ORDER — CHLORHEXIDINE GLUCONATE 0.12 % MT SOLN: 15.0000 mL | Freq: Once | OROMUCOSAL | Status: DC

## 2021-02-16 MED ORDER — VANCOMYCIN HCL 1000 MG IV SOLR
INTRAVENOUS | Status: DC | PRN
  Administered 2021-02-16: 1000 mg

## 2021-02-16 MED ORDER — ROCURONIUM 10MG/ML (10ML) SYRINGE FOR MEDFUSION PUMP - OPTIME
INTRAVENOUS | Status: DC | PRN
  Administered 2021-02-16: 40 mg via INTRAVENOUS

## 2021-02-16 MED ORDER — SODIUM CHLORIDE 0.9 % IR SOLN
Status: DC | PRN
  Administered 2021-02-16: 3000 mL
  Administered 2021-02-16: 1000 mL

## 2021-02-16 MED ORDER — MORPHINE SULFATE (PF) 2 MG/ML IV SOLN
1.0000 mg | INTRAVENOUS | Status: DC | PRN
  Administered 2021-02-16 – 2021-02-17 (×2): 1 mg via INTRAVENOUS
  Filled 2021-02-16 (×3): qty 1

## 2021-02-16 MED ORDER — ONDANSETRON HCL 4 MG/2ML IJ SOLN
INTRAMUSCULAR | Status: DC | PRN
  Administered 2021-02-16: 4 mg via INTRAVENOUS

## 2021-02-16 MED ORDER — CELECOXIB 100 MG PO CAPS
100.0000 mg | ORAL_CAPSULE | Freq: Two times a day (BID) | ORAL | Status: DC
  Administered 2021-02-16 – 2021-02-17 (×3): 100 mg via ORAL
  Filled 2021-02-16 (×3): qty 1

## 2021-02-16 SURGICAL SUPPLY — 71 items
APL PRP STRL LF DISP 70% ISPRP (MISCELLANEOUS) ×1
BASEPLATE AUG FULL 24X20 +2 (Plate) ×2 IMPLANT
BASEPLATE MOD POST AUGM MGS 20 (Plate) ×2 IMPLANT
BIT DRILL FLUTED 3.0 STRL (BIT) ×2 IMPLANT
BLADE HEX COATED 2.75 (ELECTRODE) ×2 IMPLANT
BLADE SAW SGTL 83.5X18.5 (BLADE) ×2 IMPLANT
BLADE SURG SZ10 CARB STEEL (BLADE) ×4 IMPLANT
BNDG GAUZE ELAST 4 BULKY (GAUZE/BANDAGES/DRESSINGS) ×4 IMPLANT
CALIBRATOR GLENOID VIP 5-D (SYSTAGENIX WOUND MANAGEMENT) ×2 IMPLANT
CHLORAPREP W/TINT 26 (MISCELLANEOUS) ×2 IMPLANT
CLOTH BEACON ORANGE TIMEOUT ST (SAFETY) ×2 IMPLANT
COOLER ICEMAN CLASSIC (MISCELLANEOUS) ×2 IMPLANT
COVER LIGHT HANDLE STERIS (MISCELLANEOUS) ×4 IMPLANT
CUFF CRYO UNI SHDR 32X48 (MISCELLANEOUS) ×2 IMPLANT
CUP SUT UNIV REVERS 36+2 RT (Cup) ×2 IMPLANT
DRAPE HALF SHEET 40X57 (DRAPES) ×2 IMPLANT
DRAPE INCISE IOBAN 44X35 STRL (DRAPES) ×2 IMPLANT
DRAPE SHOULDER BEACH CHAIR (DRAPES) ×2 IMPLANT
DRESSING AQUACEL AG ADV 3.5X12 (MISCELLANEOUS) ×1 IMPLANT
DRSG AQUACEL AG ADV 3.5X12 (MISCELLANEOUS) ×2
ELECT REM PT RETURN 9FT ADLT (ELECTROSURGICAL) ×2
ELECTRODE REM PT RTRN 9FT ADLT (ELECTROSURGICAL) ×1 IMPLANT
GLENOSPHERE 36 +4 LAT/24 (Joint) ×2 IMPLANT
GLOVE SRG 8 PF TXTR STRL LF DI (GLOVE) ×1 IMPLANT
GLOVE SURG POLYISO LF SZ7 (GLOVE) ×2 IMPLANT
GLOVE SURG POLYISO LF SZ8 (GLOVE) ×4 IMPLANT
GLOVE SURG UNDER POLY LF SZ7 (GLOVE) ×8 IMPLANT
GLOVE SURG UNDER POLY LF SZ8 (GLOVE) ×2
GOWN STRL REUS W/ TWL XL LVL3 (GOWN DISPOSABLE) ×1 IMPLANT
GOWN STRL REUS W/TWL LRG LVL3 (GOWN DISPOSABLE) ×4 IMPLANT
GOWN STRL REUS W/TWL XL LVL3 (GOWN DISPOSABLE) ×2
HANDPIECE INTERPULSE COAX TIP (DISPOSABLE) ×2
HOOD W/PEELAWAY (MISCELLANEOUS) ×6 IMPLANT
INST SET MINOR BONE (KITS) ×2 IMPLANT
IV NS IRRIG 3000ML ARTHROMATIC (IV SOLUTION) ×2 IMPLANT
KIT BLADEGUARD II DBL (SET/KITS/TRAYS/PACK) ×2 IMPLANT
KIT POSITION SHOULDER SCHLEI (MISCELLANEOUS) ×2 IMPLANT
KIT TURNOVER KIT A (KITS) ×2 IMPLANT
LINER HUMERAL 36 +3MM SM (Shoulder) ×2 IMPLANT
MANIFOLD NEPTUNE II (INSTRUMENTS) ×2 IMPLANT
MARKER SKIN DUAL TIP RULER LAB (MISCELLANEOUS) ×2 IMPLANT
NEEDLE HYPO 21X1.5 SAFETY (NEEDLE) ×2 IMPLANT
NEEDLE MAYO 1/2 CRC TROCAR PT (NEEDLE) ×2 IMPLANT
NS IRRIG 1000ML POUR BTL (IV SOLUTION) ×2 IMPLANT
PACK BASIC III (CUSTOM PROCEDURE TRAY) ×2
PACK SRG BSC III STRL LF ECLPS (CUSTOM PROCEDURE TRAY) ×1 IMPLANT
PACK TOTAL JOINT (CUSTOM PROCEDURE TRAY) ×2 IMPLANT
PAD ABD 5X9 TENDERSORB (GAUZE/BANDAGES/DRESSINGS) ×10 IMPLANT
PIN NITINOL TARGETER 2.8 (PIN) ×2 IMPLANT
PIN SET MODULAR GLENOID SYSTEM (PIN) ×2 IMPLANT
SCREW PERI LOCK 5.5X16 (Screw) ×4 IMPLANT
SCREW PERIPHERAL NL 4.5X28 (Screw) ×4 IMPLANT
SET BASIN LINEN APH (SET/KITS/TRAYS/PACK) ×2 IMPLANT
SET HNDPC FAN SPRY TIP SCT (DISPOSABLE) ×1 IMPLANT
SLING ULTRA III MED (ORTHOPEDIC SUPPLIES) ×2 IMPLANT
SPONGE T-LAP 18X18 ~~LOC~~+RFID (SPONGE) ×6 IMPLANT
STEM HUMERAL UNI REVERS SZ9 (Stem) ×2 IMPLANT
STRIP CLOSURE SKIN 1/2X4 (GAUZE/BANDAGES/DRESSINGS) ×4 IMPLANT
SUT MNCRL AB 4-0 PS2 18 (SUTURE) ×2 IMPLANT
SUT MON AB 2-0 CT1 36 (SUTURE) ×2 IMPLANT
SUT VIC AB 0 CT1 27 (SUTURE) ×2
SUT VIC AB 0 CT1 27XBRD ANTBC (SUTURE) ×1 IMPLANT
SUTURE TAPE 1.3 40 TPR END (SUTURE) ×3 IMPLANT
SUTURETAPE 1.3 40 TPR END (SUTURE) ×6
SUTURETAPE 1.3 40 W/NDL BLK/WH (SUTURE) ×6 IMPLANT
SYR BULB IRRIG 60ML STRL (SYRINGE) ×2 IMPLANT
TOWEL OR 17X26 4PK STRL BLUE (TOWEL DISPOSABLE) ×2 IMPLANT
TRAY FOLEY W/BAG SLVR 16FR (SET/KITS/TRAYS/PACK) ×2
TRAY FOLEY W/BAG SLVR 16FR ST (SET/KITS/TRAYS/PACK) ×1 IMPLANT
WATER STERILE IRR 1000ML POUR (IV SOLUTION) ×4 IMPLANT
YANKAUER SUCT 12FT TUBE ARGYLE (SUCTIONS) ×4 IMPLANT

## 2021-02-16 NOTE — Transfer of Care (Signed)
Immediate Anesthesia Transfer of Care Note  Patient: Lisa Beck  Procedure(s) Performed: REVERSE SHOULDER ARTHROPLASTY (Right: Shoulder)  Patient Location: PACU  Anesthesia Type:General  Level of Consciousness: awake  Airway & Oxygen Therapy: Patient Spontanous Breathing  Post-op Assessment: Report given to RN  Post vital signs: Reviewed and stable  Last Vitals:  Vitals Value Taken Time  BP 121/60 02/16/21 1048  Temp    Pulse 72 02/16/21 1054  Resp 15 02/16/21 1054  SpO2 99 % 02/16/21 1054  Vitals shown include unvalidated device data.  Last Pain:  Vitals:   02/16/21 0647  TempSrc: Oral  PainSc: 8       Patients Stated Pain Goal: 8 (02/16/21 7846)  Complications: No notable events documented.

## 2021-02-16 NOTE — Op Note (Signed)
Orthopaedic Surgery Operative Note (CSN: [id number])  Lisa Beck  [birth year] Date of Surgery: 02/16/2021   Diagnoses:  Right glenohumeral arthritis  Procedure: Right reverse shoulder arthroplasty   Operative Finding Successful completion of the planned procedure.  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  Deformity corrected with reverse shoulder arthroplasty using an augmented baseplate.     Post-Op Diagnosis: Same Surgeons:Primary: Oliver Barre, MD Assistants:  Cecile Sheerer Location: [facility] OR ROOM 4 Anesthesia: General Antibiotics: Ancef 2 g with local vancomycin powder 1 g at the surgical site Tourniquet time: N/A Estimated Blood Loss: 200 Complications: None Specimens: None Implants: Implant Name Type Inv. Item Serial No. Manufacturer Lot No. LRB No. Used Action  BASEPLATE AUG FULL [id number] +2 - [id number] Plate BASEPLATE AUG FULL 35T01 +2  [employer] [id number] Right 1 Implanted  BASEPLATE MOD POST AUGM MGS 20 - [id number] Plate BASEPLATE MOD POST AUGM MGS 20  [employer] [id number] Right 1 Implanted  SCREW PERIPHERAL NL 4.5X28 - [id number] Screw SCREW PERIPHERAL NL 4.5X28  [employer] [id number] Right 1 Implanted  SCREW PERIPHERAL NL 4.5X28 - [id number] Screw SCREW PERIPHERAL NL 4.5X28  [employer] [id number] Right 1 Implanted  SCREW PERI LOCK 5.5X16 - [id number] Screw SCREW PERI LOCK 5.5X16  [employer] [id number] Right 1 Implanted  SCREW PERI LOCK 5.5X16 - [id number] Screw SCREW PERI LOCK 5.5X16  [employer] [id number] Right 1 Implanted  GLENOSPHERE 36 +4 LAT/24 - [id number] Joint GLENOSPHERE 36 +4 LAT/24  [employer] [id number] Right 1 Implanted  STEM HUMERAL UNI REVERS SZ9 - [id number] Stem STEM HUMERAL UNI REVERS SZ9  [employer] [id number] Right 1 Implanted  CUP SUT UNIV REVERS 36+2 RT - [id number] Cup CUP SUT UNIV REVERS 36+2 RT  [employer] [id number] Right 1 Implanted  LINER HUMERAL 36 +3MM SM - [id number] Shoulder LINER HUMERAL 36 +3MM SM  [employer] [id number] Right 1  Implanted    Indications for Surgery:   Lisa Beck is a 80 y.o. female with severe right glenohumeral arthritis.  Her rotator cuff was intact but she a deformity of the glenoid and this required an augment.  As such, I recommended a reverse shoulder arthroplasty.  Benefits and risks of operative and nonoperative management were discussed prior to surgery with patient and informed consent form was completed.  Specific risks including, but not limited to, infection, need for additional surgery, bleeding, non-union, implant loosening, malunion, persistent pain, stiffness, dislocation and more severe complications associated with anesthesia were discussed with the patient.  The patient has elected to proceed.  She received medical clearance prior to surgery.  Surgical consent was obtained.    Procedure:   The patient was identified properly. Informed consent was obtained and the surgical site was marked. The patient was taken to the operating room where general anesthesia was induced.  The patient was positioned in beach chair position.  The right shoulder was prepped and draped in the usual sterile fashion.  Timeout was performed before the beginning of the case.  The patient received appropriate antibiotics prior to making incision.  In addition, the patient received 1 g TXA prior to starting.  This was confirmed during the preincision timeout.  We made an incision for the standard deltopectoral approach was performed with a #10 blade. We dissected down through the subcutaneous tissues and the cephalic vein was taken laterally with the deltoid. The clavipectoral fascia was incised in line with the incision. Deep retractors were placed. The long of the biceps tendon was  identified and there was significant tenosynovitis present.  A biceps tenodesis was performed to the pectoralis tendon with #2 Fiberwire. The remaining biceps was followed up into the rotator interval where it was released.   The  subscapularis was taken down in a full thickness layer with capsule along the humeral neck extending inferiorly around the humeral head. We continued releasing the capsule directly off of the osteophytes inferiorly all the way around the corner. This allowed [place] to dislocate the humeral head. Multiple tag stitches were placed in the subscapularis tendon to maintain control during the release of the tendon, and for the remainder of the case.   The humeral head had evidence of severe osteoarthritic wear with full-thickness cartilage loss and exposed subchondral bone.  There were osteophytes along the inferior humeral neck. The osteophytes were removed with an osteotome and a rongeur.  The anatomic neck was well visualized.     A humeral cutting guide with a version rod was secured to the anterior aspect of the humeral head. The version was set at 20 of retroversion. A humeral osteotomy was performed with an oscillating saw. The head fragment was passed off to the back table, and used to estimate the humeral head size for our implant.  A cut protector was placed.  The humerus was retracted posteriorly and we turned our attention to glenoid exposure.   The subscapularis was again identified and we took care to palpate the axillary nerve anteriorly and verify its position with gentle palpation as well as the tug test.  We then released the SGHL with bovie cautery prior to placing a curved mayo at the junction of the anterior glenoid well above the axillary nerve and bluntly dissecting the subscapularis from the capsule.  We then carefully protected the axillary nerve as we gently released the inferior capsule to fully mobilize the subscapularis.  An anterior deltoid retractor was then placed as well as a small Hohmann retractor superiorly.   The glenoid was inspected and had evidence of severe osteoarthritic wear with full-thickness cartilage loss and exposed subchondral bone.  The remaining labrum was removed  circumferentially taking great care not to disrupt the posterior capsule.   The glenoid drill guide was assembled according to preoperative templating.  It was then placed and used to drill a guide pin in position, based on the preoperative CT. The glenoid face was then reamed eccentrically, over the guide wire, with the augment positioned at 10 o'clock. The center hole was drilled over the guidepin in a near anatomic angle of version. Next the glenoid vault was drilled back to a depth of 20 mm.  We then placed a 24 mm size baseplate with 4 mm lateralization with a 20 mm length central peg.  The base plate was pressfit into the glenoid vault obtaining secure fixation. We next placed superior and inferior nonlocking screws for additional fixation, followed by 2 additional locking screws.  Next a 36 mm glenosphere was selected and impacted onto the baseplate. The center screw was tightened.    We turned attention back to the humeral side. The cut protector was removed. A starter awl was used to open the humeral canal. We next used T-handle straight reamers to ream up to an appropriate fit. We then broached starting with a size 5 broach and broaching up to a 9 which obtained an appropriate fit. The broach handle was removed. We trialed with multiple size tray and polyethylene options and selected a 36 which provided good stability and  range of motion without excess soft tissue tension. The offset was dialed in to match the normal anatomy. The shoulder was trialed.  There was good ROM in all planes and the shoulder was stable with no inferior translation.   The real humeral implants were opened after again confirming sizes.  The trial was removed. Fiberwire sutures were passed through the humeral neck for subscap repair. The humeral component was press-fit obtaining a secure fit. A posterior offset tray was selected and impacted onto the stem.  A 36+3 polyethylene liner was impacted onto the stem.  The joint was  reduced and thoroughly irrigated with pulsatile lavage. Vancomycin powder was used.  The subscapularis was repaired back with #2 Fiberwire sutures through bone tunnels. Hemostasis was obtained. The deltopectoral interval was reapproximated with #1 Vicryl. The subcutaneous tissues were closed with 2-0 monocryl and the skin was closed with running monocryl.    The wounds were cleaned and dried and an Aquacel dressing was placed. The drapes taken down. The arm was placed into sling with abduction pillow. Patient was awakened, extubated, and transferred to the recovery room in stable condition. There were no intraoperative complications.    Post-operative plan:  The patient will be admitted for over night observation.   We have placed a referral for PT to begin 1-2 weeks postop, prior to the first postoperative clinic visit.  DVT prophylaxis Aspirin 81 mg twice daily for 6 weeks.    Pain control with PRN pain medication preferring oral medicines.   Follow up plan will be scheduled in approximately 10-14 days for incision check and XR.

## 2021-02-16 NOTE — Anesthesia Procedure Notes (Signed)
Anesthesia Regional Block: Interscalene brachial plexus block   Pre-Anesthetic Checklist: , timeout performed,  Correct Patient, Correct Site, Correct Laterality,  Correct Procedure, Correct Position, site marked,  Risks and benefits discussed,  Surgical consent,  Pre-op evaluation,  At surgeon's request and post-op pain management  Laterality: Right  Prep: chloraprep       Needles:   Needle Type: Stimiplex     Needle Length: 5cm  Needle Gauge: 22     Additional Needles:   Procedures:, nerve stimulator,,, ultrasound used (permanent image in chart),,     Nerve Stimulator or Paresthesia:  Response: Thenar Twitch, 0.4 mA, 2 cm  Additional Responses:   Narrative:  Start time: 02/16/2021 7:00 AM End time: 02/16/2021 7:04 AM Injection made incrementally with aspirations every 5 mL.

## 2021-02-16 NOTE — Anesthesia Procedure Notes (Addendum)
Procedure Name: Intubation Date/Time: 02/16/2021 7:42 AM Performed by: Ollen Bowl, CRNA Pre-anesthesia Checklist: Patient identified, Patient being monitored, Timeout performed, Emergency Drugs available and Suction available Patient Re-evaluated:Patient Re-evaluated prior to induction Oxygen Delivery Method: Circle system utilized Preoxygenation: Pre-oxygenation with 100% oxygen Induction Type: IV induction Ventilation: Mask ventilation without difficulty Laryngoscope Size: Mac and 3 Grade View: Grade I Tube type: Oral Tube size: 7.0 mm Number of attempts: 1 Airway Equipment and Method: Stylet Placement Confirmation: ETT inserted through vocal cords under direct vision, positive ETCO2 and breath sounds checked- equal and bilateral Secured at: 21 cm Tube secured with: Tape Dental Injury: Teeth and Oropharynx as per pre-operative assessment

## 2021-02-16 NOTE — Anesthesia Postprocedure Evaluation (Signed)
Anesthesia Post Note  Patient: Lisa Beck  Procedure(s) Performed: REVERSE SHOULDER ARTHROPLASTY (Right: Shoulder)  Patient location during evaluation: Phase II Anesthesia Type: General Level of consciousness: awake Pain management: pain level controlled Vital Signs Assessment: post-procedure vital signs reviewed and stable Respiratory status: spontaneous breathing and respiratory function stable Cardiovascular status: blood pressure returned to baseline and stable Postop Assessment: no headache and no apparent nausea or vomiting Anesthetic complications: no Comments: Late entry   No notable events documented.   Last Vitals:  Vitals:   02/16/21 1130 02/16/21 1157  BP: 126/66 125/65  Pulse: 73 80  Resp: 15 18  Temp:  36.5 C  SpO2: 96% 97%    Last Pain:  Vitals:   02/16/21 1157  TempSrc: Oral  PainSc:                  Windell Norfolk

## 2021-02-16 NOTE — Progress Notes (Signed)
Patient asked if she would like to get up at bedside so the tech and I could do CHG wipes and fix bed sheets. She did not want to. Reminded patient that she will need to try and move around/get up to be ready for discharge and she still declined at this time.

## 2021-02-16 NOTE — Interval H&P Note (Signed)
History and Physical Interval Note:  02/16/2021 7:20 AM  [name]  has presented today for surgery, with the diagnosis of Right glenohumeral arthritis.  The various methods of treatment have been discussed with the patient and family. After consideration of risks, benefits and other options for treatment, the patient has consented to  Procedure(s) with comments: REVERSE SHOULDER ARTHROPLASTY (Right) - Right reverse shoulder arthroplasty as a surgical intervention.  The patient's history has been reviewed, patient examined, no change in status, stable for surgery.  I have reviewed the patient's chart and labs.  Questions were answered to the patient's satisfaction.     Oliver Barre

## 2021-02-17 ENCOUNTER — Other Ambulatory Visit: Payer: Self-pay

## 2021-02-17 ENCOUNTER — Encounter (HOSPITAL_COMMUNITY): Payer: Self-pay | Admitting: Orthopedic Surgery

## 2021-02-17 DIAGNOSIS — M19011 Primary osteoarthritis, right shoulder: Secondary | ICD-10-CM | POA: Diagnosis not present

## 2021-02-17 LAB — CBC
HCT: 33.2 % — ABNORMAL LOW (ref 36.0–46.0)
Hemoglobin: 11 g/dL — ABNORMAL LOW (ref 12.0–15.0)
MCH: 30.1 pg (ref 26.0–34.0)
MCHC: 33.1 g/dL (ref 30.0–36.0)
MCV: 91 fL (ref 80.0–100.0)
Platelets: 240 10*3/uL (ref 150–400)
RBC: 3.65 MIL/uL — ABNORMAL LOW (ref 3.87–5.11)
RDW: 12.8 % (ref 11.5–15.5)
WBC: 10.1 10*3/uL (ref 4.0–10.5)
nRBC: 0 % (ref 0.0–0.2)

## 2021-02-17 MED ORDER — ACETAMINOPHEN 500 MG PO TABS: 1000.0000 mg | ORAL_TABLET | Freq: Three times a day (TID) | ORAL | 0 refills | Status: AC

## 2021-02-17 MED ORDER — KETOROLAC TROMETHAMINE 15 MG/ML IJ SOLN
15.0000 mg | Freq: Once | INTRAMUSCULAR | Status: AC
  Administered 2021-02-17: 15 mg via INTRAVENOUS
  Filled 2021-02-17: qty 1

## 2021-02-17 MED ORDER — OXYCODONE HCL 5 MG PO TABS: 5.0000 mg | ORAL_TABLET | ORAL | 0 refills | Status: AC | PRN

## 2021-02-17 MED ORDER — ONDANSETRON HCL 4 MG PO TABS: 4.0000 mg | ORAL_TABLET | Freq: Three times a day (TID) | ORAL | 0 refills | Status: AC | PRN

## 2021-02-17 MED ORDER — CELECOXIB 100 MG PO CAPS: 100.0000 mg | ORAL_CAPSULE | Freq: Every day | ORAL | 0 refills | Status: AC

## 2021-02-17 NOTE — Progress Notes (Signed)
Patient states finally some improvement in pain, 6/10 and much more bearable.

## 2021-02-17 NOTE — Progress Notes (Signed)
Patient has had a steady 8/10 pain throughout shift. She has received all pain meds in chart as they are prescribed and I reached out to the MD to see if he could add something else even for a one time dose to which toradol was prescribed. Patient still states no change in pain. She has ice on shoulder as well.

## 2021-02-17 NOTE — Discharge Instructions (Signed)
Lisa Beck [name], MD MS [facility] [address] [place],  [place]  [address] Phone: [phone] Fax: [phone]    POST-OPERATIVE INSTRUCTIONS - TOTAL SHOULDER REPLACEMENT    WOUND CARE You may leave the operative dressing in place until your follow-up appointment. KEEP THE INCISIONS CLEAN AND DRY. There may be a small amount of fluid/bleeding leaking at the surgical site. This is normal after surgery.  If it fills with liquid or blood please call us immediately to change it for you. Use the provided ice machine or Ice packs as often as possible for the first 3-4 days, then as needed for pain relief.  Keep a layer of cloth or a shirt between your skin and the cooling unit to prevent frost bite as it can get very cold.  SHOWERING: - You may shower on Post-Op Day #3.  - The dressing is water resistant but do not scrub it as it may start to peel up.   - You may remove the sling for showering, but keep a water resistant pillow under the arm to keep both the  elbow and shoulder away from the body (mimicking the abduction sling).  - Gently pat the area dry.  - Do not soak the shoulder in water. Do not go swimming in the pool or ocean until your sutures are removed. - KEEP THE INCISIONS CLEAN AND DRY.  EXERCISES Wear the sling at all times except when doing your exercises. You may remove the sling for showering, but keep the arm across the chest or in a secondary sling.    Accidental/Purposeful External Rotation and shoulder flexion (reaching behind you) is to be avoided at all costs for the first month. It is ok to come out of your sling if your are sitting and have assistance for eating.  Do not lift anything heavier than 1 pound until we discuss it further in clinic. Please perform the exercises:   Elbow / Hand / Wrist  Range of Motion Exercises Grip strengthening   REGIONAL ANESTHESIA (NERVE BLOCKS) The anesthesia team may have performed a nerve block  for you if safe in the setting of your care.  This is a great tool used to minimize pain.  Typically the block may start wearing off overnight but the long acting medicine may last for 3-4 days.  The nerve block wearing off can be a challenging period but please utilize your as needed pain medications to try and manage this period.    POST-OP MEDICATIONS- Multimodal approach to pain control In general your pain will be controlled with a combination of substances.  Prescriptions unless otherwise discussed are electronically sent to your pharmacy.  This is a carefully made plan we use to minimize narcotic use.     Celebrex - Anti-inflammatory medication taken on a scheduled basis Acetaminophen - Non-narcotic pain medicine taken on a scheduled basis  Oxycodone - This is a strong narcotic, to be used only on an "as needed" basis for pain. Aspirin 81mg  - This medicine is used to minimize the risk of blood clots after surgery.  Resume aspirin when you go home Zofran -  take as needed for nausea  Celebrex - these are anti-inflammatory and pain relievers.  Do not take additional ibuprofen, naproxen or other NSAID while taking this medicine.   FOLLOW-UP If you develop a Fever (>101.5), Redness or Drainage from the surgical incision site, please call our office to arrange for an evaluation. Please call the office  to schedule a follow-up appointment for a wound check, 7-10 days post-operatively.   HELPFUL INFORMATION  If you had a block, it will wear off between 8-24 hrs postop typically.  This is period when your pain may go from nearly zero to the pain you would have had postop without the block.  This is an abrupt transition but nothing dangerous is happening.  You may take an extra dose of narcotic when this happens.  You should wean off your narcotic medicines as soon as you are able.  Most patients will be off or using minimal narcotics before their first postop appointment.   Elevating your leg  will help with swelling and pain control.  You are encouraged to elevate your leg as much as possible in the first couple of weeks following surgery.  Imagine a drop of water on your toe, and your goal is to get that water back to your heart.  We suggest you use the pain medication the first night prior to going to bed, in order to ease any pain when the anesthesia wears off. You should avoid taking pain medications on an empty stomach as it will make you nauseous.  Do not drink alcoholic beverages or take illicit drugs when taking pain medications.  In most states it is against the law to drive while you are in a splint or sling.  And certainly against the law to drive while taking narcotics.  You may return to work/school in the next couple of days when you feel up to it.   Pain medication may make you constipated.  Below are a few solutions to try in this order: Decrease the amount of pain medication if you aren't having pain. Drink lots of decaffeinated fluids. Drink prune juice and/or each dried prunes  If the first 3 don't work start with additional solutions Take Colace - an over-the-counter stool softener Take Senokot - an over-the-counter laxative Take Miralax - a stronger over-the-counter laxative

## 2021-02-17 NOTE — Evaluation (Signed)
Physical Therapy Evaluation Patient Details Name: Lisa Beck MRN: [id number] DOB: Jul 23, 1940 Today's Date: 02/17/2021  History of Present Illness  ANUSHKA HARTINGER is a 80 y.o. female with Right shoulder glenohumeral arthritis s/p right reverse shoulder arthroplasty on February 16, 2021.   Clinical Impression  Patient does not require physical assist for mobility today but is provided with min G for balance/safety with transfers and ambulation. Patient does not require use of AD today but is somewhat unsteady when ambulating without loss of balance. Patient returned to room at end of session. Patient discharged to care of nursing for ambulation daily as tolerated for length of stay.        Recommendations for follow up therapy are one component of a multi-disciplinary discharge planning process, led by the attending physician.  Recommendations may be updated based on patient status, additional functional criteria and insurance authorization.  Follow Up Recommendations Follow physician's recommendations for discharge plan and follow up therapies    Assistance Recommended at Discharge Intermittent Supervision/Assistance  Functional Status Assessment Patient has had a recent decline in their functional status and demonstrates the ability to make significant improvements in function in a reasonable and predictable amount of time.  Equipment Recommendations  None recommended by PT    Recommendations for Other Services       Precautions / Restrictions Precautions Precautions: Fall Precaution Comments: s/p R reverse shoulder arthroplasty on 02/16/21 Required Braces or Orthoses: Sling Restrictions Weight Bearing Restrictions: No      Mobility  Bed Mobility Overal bed mobility: Modified Independent             General bed mobility comments: with HOB elevated    Transfers Overall transfer level: Needs assistance Equipment used: None Transfers: Sit to/from Stand Sit to Stand:  Min guard           General transfer comment: for safety/balance    Ambulation/Gait Ambulation/Gait assistance: Min guard Gait Distance (Feet): 120 Feet Assistive device: None Gait Pattern/deviations: Step-through pattern;Drifts right/left Gait velocity: decreased     General Gait Details: slow, labored, unsteady cadence without AD; unsteady throughout without loss of balance  Stairs            Wheelchair Mobility    Modified Rankin (Stroke Patients Only)       Balance Overall balance assessment: Needs assistance Sitting-balance support: Feet supported;No upper extremity supported Sitting balance-Leahy Scale: Normal Sitting balance - Comments: seated EOB   Standing balance support: No upper extremity supported Standing balance-Leahy Scale: Fair Standing balance comment: without AD                             Pertinent Vitals/Pain Pain Assessment: 0-10 Pain Score: 7  Pain Location: R shoulder Pain Descriptors / Indicators: Other (Comment) ("just pain") Pain Intervention(s): Limited activity within patient's tolerance;Monitored during session;Repositioned;Premedicated before session    [place] expects to be discharged to:: Private residence Living Arrangements: Spouse/significant other Available Help at Discharge: Family Type of Home: House Home Access: Stairs to enter Entrance Stairs-Rails: Left Entrance Stairs-Number of Steps: 1   Home Layout: Two level Home Equipment: Grab bars - tub/shower      Prior Function Prior Level of Function : Independent/Modified Independent             Mobility Comments: patient states community ambulation without AD ADLs Comments: Independent     Hand Dominance        Extremity/Trunk Assessment  Upper Extremity Assessment Upper Extremity Assessment: Defer to OT evaluation    Lower Extremity Assessment Lower Extremity Assessment: Overall WFL for tasks assessed     Cervical / Trunk Assessment Cervical / Trunk Assessment: Normal  Communication   Communication: No difficulties  Cognition Arousal/Alertness: Awake/alert Behavior During Therapy: WFL for tasks assessed/performed Overall Cognitive Status: Within Functional Limits for tasks assessed                                          General Comments      Exercises     Assessment/Plan    PT Assessment All further PT needs can be met in the next venue of care  PT Problem List Decreased strength;Decreased activity tolerance;Decreased balance;Decreased mobility       PT Treatment Interventions      PT Goals (Current goals can be found in the Care Plan section)  Acute Rehab PT Goals Patient Stated Goal: return home PT Goal Formulation: With patient Time For Goal Achievement: 02/17/21 Potential to Achieve Goals: Good    Frequency     Barriers to discharge        Co-evaluation PT/OT/SLP Co-Evaluation/Treatment: Yes Reason for Co-Treatment: Complexity of the patient's impairments (multi-system involvement);To address functional/ADL transfers PT goals addressed during session: Mobility/safety with mobility;Balance         AM-PAC PT "6 Clicks" Mobility  Outcome Measure Help needed turning from your back to your side while in a flat bed without using bedrails?: None Help needed moving from lying on your back to sitting on the side of a flat bed without using bedrails?: A Little Help needed moving to and from a bed to a chair (including a wheelchair)?: A Little Help needed standing up from a chair using your arms (e.g., wheelchair or bedside chair)?: A Little Help needed to walk in hospital room?: A Little Help needed climbing 3-5 steps with a railing? : A Little 6 Click Score: 19    End of Session Equipment Utilized During Treatment: Other (comment) (Sling) Activity Tolerance: Patient tolerated treatment well Patient left: in bed;with call bell/phone within  reach Nurse Communication: Mobility status PT Visit Diagnosis: Other abnormalities of gait and mobility (R26.89);Unsteadiness on feet (R26.81)    Time: 3912-2583 PT Time Calculation (min) (ACUTE ONLY): 17 min   Charges:   PT Evaluation $PT Eval Low Complexity: 1 Low          9:31 AM, 02/17/21 Mearl Latin PT, DPT Physical Therapist at [facility]

## 2021-02-17 NOTE — Discharge Summary (Signed)
Patient ID: REDIET ZITTEL MRN: [id number] DOB/AGE: Feb 11, 1941 80 y.o.  Admit date: 02/16/2021 Discharge date: 02/17/2021  Admission Diagnoses: Right glenohumeral arthritis  Discharge Diagnoses:  Principal Problem:   Primary osteoarthritis of right shoulder Active Problems:   Arthritis of right glenohumeral joint   Past Medical History:  Diagnosis Date   Arthritis    Diabetes mellitus without complication ([place])    History of kidney stones    HOH (hard of hearing)    Hyperlipidemia    Hypertension    Hypothyroidism      Procedures Performed: Right reverse shoulder arthroplasty  Discharged Condition: good  Hospital Course: Patient brought in as an outpatient for surgery.  Tolerated procedure well.  Was kept for monitoring overnight for pain control and medical monitoring postop and was found to be stable for DC home the morning after surgery.  Patient was instructed on specific activity restrictions and all questions were answered. She was evaluated by PT and OT prior to DC and all equipment and needs have been addressed.   Consults: PT and OT  Significant Diagnostic Studies: No additional pertinent studies  Treatments: Surgery  Discharge Exam:  Vitals:   02/17/21 0542 02/17/21 0816  BP: 128/62 (!) 107/58  Pulse: 93 85  Resp: 18   Temp: 98.4 F (36.9 C)   SpO2: 96%    Alert and oriented, no acute distress Pain is better controlled Sensation to right hand is intact Sensation to axillary nerve distribution is intact Sling is positioned appropriately.  Polar care in place Dressing is clean, dry and intact Active motion to hand intact.    CBC Latest Ref Rng & Units 02/17/2021 01/23/2021 12/12/2020  WBC 4.0 - 10.5 K/uL 10.1 9.9 7.8  Hemoglobin 12.0 - 15.0 g/dL 11.0(L) 13.6 13.3  Hematocrit 36.0 - 46.0 % 33.2(L) 40.2 40.1  Platelets 150 - 400 K/uL 240 342 313     Disposition: Discharge disposition: 01-Home or Self Care        Allergies as of  02/17/2021   No Known Allergies      Medication List     STOP taking these medications    ferrous sulfate 324 (65 Fe) MG Tbec       TAKE these medications    acetaminophen 500 MG tablet Commonly known as: TYLENOL Take 2 tablets (1,000 mg total) by mouth every 8 (eight) hours for 14 days.   amLODipine 5 MG tablet Commonly known as: NORVASC Take 1 tablet (5 mg total) by mouth daily.   aspirin EC 81 MG tablet Take 81 mg by mouth daily.   atorvastatin 40 MG tablet Commonly known as: LIPITOR Take 40 mg by mouth every evening.   celecoxib 100 MG capsule Commonly known as: CeleBREX Take 1 capsule (100 mg total) by mouth daily for 14 days.   Debrox 6.5 % OTIC solution Generic drug: carbamide peroxide Place 5 drops into both ears 2 (two) times daily.   insulin glargine 100 UNIT/ML injection Commonly known as: LANTUS Inject 0.22 mLs (22 Units total) into the skin at bedtime.   levothyroxine 100 MCG tablet Commonly known as: SYNTHROID Take 1 tablet (100 mcg total) by mouth daily.   metFORMIN 500 MG tablet Commonly known as: GLUCOPHAGE Take 500 mg by mouth 2 (two) times daily.   metoprolol succinate 50 MG 24 hr tablet Commonly known as: TOPROL-XL Take 50 mg by mouth daily. Take with or immediately following a meal.   ondansetron 4 MG tablet Commonly known as: Zofran  Take 1 tablet (4 mg total) by mouth every 8 (eight) hours as needed for up to 14 days for nausea or vomiting.   oxyCODONE 5 MG immediate release tablet Commonly known as: Roxicodone Take 1 tablet (5 mg total) by mouth every 4 (four) hours as needed for up to 7 days.   telmisartan-hydrochlorothiazide 40-12.5 MG tablet Commonly known as: MICARDIS HCT Take 1 tablet by mouth daily.   Trulicity 0.75 MG/0.5ML Sopn Generic drug: Dulaglutide Inject into the skin. Once a week   VITAMIN D PO Take by mouth daily.        Follow-up Information     Oliver Barre, MD Follow up.   Specialties:  Orthopedic Surgery, Sports Medicine Why: 10-14 days for suture removal/incision check Contact information: [address] [place] [address] [phone]

## 2021-02-17 NOTE — Evaluation (Signed)
Occupational Therapy Evaluation Patient Details Name: Lisa Beck MRN: [id number] DOB: [birth year] Today's Date: 02/17/2021   History of Present Illness Lisa Beck is a 80 y.o. female with Right shoulder glenohumeral arthritis s/p right reverse shoulder arthroplasty on February 16, 2021.   Clinical Impression   Pt agreeable to OT/PT co-evaluation. Pt able to complete bed mobility with modified independence. Min G assist needed for functional mobility due to mild unsteadiness on feet. Pt able to doff and on a sock without assist. Pt likely will need PRN assist from husband for ADL's upon return to home. Pt is not recommended for further acute OT services and will be discharged to care of nursing staff for remaining length of stay.      Recommendations for follow up therapy are one component of a multi-disciplinary discharge planning process, led by the attending physician.  Recommendations may be updated based on patient status, additional functional criteria and insurance authorization.   Follow Up Recommendations  Outpatient OT (follow up with outpatient PT or OT as planned.)    Assistance Recommended at Discharge PRN  Functional Status Assessment  Patient has had a recent decline in their functional status and demonstrates the ability to make significant improvements in function in a reasonable and predictable amount of time.  Equipment Recommendations  None recommended by OT    Recommendations for Other Services       Precautions / Restrictions Precautions Precautions: Fall Precaution Comments: s/p R reverse shoulder arthroplasty on 02/16/21 Required Braces or Orthoses: Sling Restrictions Weight Bearing Restrictions: Yes RUE Weight Bearing: Non weight bearing      Mobility Bed Mobility Overal bed mobility: Modified Independent             General bed mobility comments: with HOB elevated    Transfers Overall transfer level: Needs assistance Equipment used:  None Transfers: Sit to/from Stand Sit to Stand: Min guard           General transfer comment: for safety/balance      Balance Overall balance assessment: Needs assistance Sitting-balance support: Feet supported;No upper extremity supported Sitting balance-Leahy Scale: Normal Sitting balance - Comments: seated EOB   Standing balance support: No upper extremity supported Standing balance-Leahy Scale: Fair Standing balance comment: without AD                           ADL either performed or assessed with clinical judgement   ADL Overall ADL's : Needs assistance/impaired                     Lower Body Dressing: Modified independent;Sitting/lateral leans Lower Body Dressing Details (indicate cue type and reason): doff and don socks seated at EOB Toilet Transfer: Min guard;Ambulation Toilet Transfer Details (indicate cue type and reason): Partially simulated via functional ambulation in hall standing from EOB.         Functional mobility during ADLs: Min guard General ADL Comments: Pt likely will need Min A from husband fro upper body and lower body dressing.     Vision Baseline Vision/History: 1 Wears glasses Ability to See in Adequate Light: 0 Adequate Patient Visual Report: No change from baseline Vision Assessment?: No apparent visual deficits     Perception     Praxis      Pertinent Vitals/Pain Pain Assessment: 0-10 Pain Score: 7  Pain Location: R shoulder Pain Descriptors / Indicators: Other (Comment) ("just pain") Pain Intervention(s): Limited activity within patient's tolerance;Monitored  during session;Repositioned     Hand Dominance Right   Extremity/Trunk Assessment Upper Extremity Assessment Upper Extremity Assessment: RUE deficits/detail (WFL L UE.) RUE Deficits / Details: R UE immobilized post op.   Lower Extremity Assessment Lower Extremity Assessment: Defer to PT evaluation   Cervical / Trunk Assessment Cervical / Trunk  Assessment: Normal   Communication Communication Communication: No difficulties   Cognition Arousal/Alertness: Awake/alert Behavior During Therapy: WFL for tasks assessed/performed Overall Cognitive Status: Within Functional Limits for tasks assessed                                                        Home Living Family/patient expects to be discharged to:: Private residence Living Arrangements: Spouse/significant other Available Help at Discharge: Family;Available 24 hours/day Type of Home: House Home Access: Stairs to enter [employer] of Steps: 1 Entrance Stairs-Rails: Left Home Layout: Two level     Bathroom Shower/Tub: Chief Strategy Officer: Standard Bathroom Accessibility: Yes   Home Equipment: Grab bars - tub/shower          Prior Functioning/Environment Prior Level of Function : Independent/Modified Independent             Mobility Comments: patient states community ambulation without AD ADLs Comments: Independent        OT Problem List:        OT Treatment/Interventions:      OT Goals(Current goals can be found in the care plan section) Acute Rehab OT Goals Patient Stated Goal: return home                   Co-evaluation PT/OT/SLP Co-Evaluation/Treatment: Yes Reason for Co-Treatment: To address functional/ADL transfers PT goals addressed during session: Mobility/safety with mobility;Balance OT goals addressed during session: ADL's and self-care      AM-PAC OT "6 Clicks" Daily Activity     Outcome Measure Help from another person eating meals?: None Help from another person taking care of personal grooming?: A Little Help from another person toileting, which includes using toliet, bedpan, or urinal?: A Little Help from another person bathing (including washing, rinsing, drying)?: A Little Help from another person to put on and taking off regular upper body clothing?: A Little Help from  another person to put on and taking off regular lower body clothing?: A Little 6 Click Score: 19   End of Session    Activity Tolerance: Patient tolerated treatment well Patient left: in bed;with call bell/phone within reach  OT Visit Diagnosis: Muscle weakness (generalized) (M62.81)                Time: 8588-5027 OT Time Calculation (min): 16 min Charges:  OT General Charges $OT Visit: 1 Visit OT Evaluation $OT Eval Low Complexity: 1 Low  Raeonna Milo OT, MOT  Danie Chandler 02/17/2021, 9:55 AM

## 2021-02-17 NOTE — Progress Notes (Signed)
Nsg Discharge Note  Admit Date:  02/16/2021 Discharge date: 02/17/2021   [name] to be D/C'd Home per MD order.  AVS completed.   Patient/caregiver able to verbalize understanding.  Discharge Medication: Allergies as of 02/17/2021   No Known Allergies      Medication List     STOP taking these medications    ferrous sulfate 324 (65 Fe) MG Tbec       TAKE these medications    acetaminophen 500 MG tablet Commonly known as: TYLENOL Take 2 tablets (1,000 mg total) by mouth every 8 (eight) hours for 14 days.   amLODipine 5 MG tablet Commonly known as: NORVASC Take 1 tablet (5 mg total) by mouth daily.   aspirin EC 81 MG tablet Take 81 mg by mouth daily.   atorvastatin 40 MG tablet Commonly known as: LIPITOR Take 40 mg by mouth every evening.   celecoxib 100 MG capsule Commonly known as: CeleBREX Take 1 capsule (100 mg total) by mouth daily for 14 days.   Debrox 6.5 % OTIC solution Generic drug: carbamide peroxide Place 5 drops into both ears 2 (two) times daily.   insulin glargine 100 UNIT/ML injection Commonly known as: LANTUS Inject 0.22 mLs (22 Units total) into the skin at bedtime.   levothyroxine 100 MCG tablet Commonly known as: SYNTHROID Take 1 tablet (100 mcg total) by mouth daily.   metFORMIN 500 MG tablet Commonly known as: GLUCOPHAGE Take 500 mg by mouth 2 (two) times daily.   metoprolol succinate 50 MG 24 hr tablet Commonly known as: TOPROL-XL Take 50 mg by mouth daily. Take with or immediately following a meal.   ondansetron 4 MG tablet Commonly known as: Zofran Take 1 tablet (4 mg total) by mouth every 8 (eight) hours as needed for up to 14 days for nausea or vomiting.   oxyCODONE 5 MG immediate release tablet Commonly known as: Roxicodone Take 1 tablet (5 mg total) by mouth every 4 (four) hours as needed for up to 7 days.   telmisartan-hydrochlorothiazide 40-12.5 MG tablet Commonly known as: MICARDIS HCT Take 1 tablet by mouth  daily.   Trulicity 0.75 MG/0.5ML Sopn Generic drug: Dulaglutide Inject into the skin. Once a week   VITAMIN D PO Take by mouth daily.        Discharge Assessment: Vitals:   02/17/21 0542 02/17/21 0816  BP: 128/62 (!) 107/58  Pulse: 93 85  Resp: 18   Temp: 98.4 F (36.9 C)   SpO2: 96%    Skin clean, dry and intact without evidence of skin break down, no evidence of skin tears noted. IV catheter discontinued intact. Site without signs and symptoms of complications - no redness or edema noted at insertion site, patient denies c/o pain - only slight tenderness at site.  Dressing with slight pressure applied.  D/c Instructions-Education: Discharge instructions given to patient/family with verbalized understanding. D/c education completed with patient/family including follow up instructions, medication list, d/c activities limitations if indicated, with other d/c instructions as indicated by MD - patient able to verbalize understanding, all questions fully answered. Patient instructed to return to ED, call 911, or call MD for any changes in condition.  Patient escorted via WC, and D/C home via private auto.  Verl Dicker, RN 02/17/2021 10:06 AM

## 2021-02-17 NOTE — Progress Notes (Signed)
Patient had foley removed this morning. 10 cc water removed to deflate balloon, catheter removed and was intact. Patient tolerated this well. Call bell on bedside table within reach for patient to call if needed.

## 2021-02-22 ENCOUNTER — Ambulatory Visit: Payer: Medicare Other | Admitting: Internal Medicine

## 2021-02-27 ENCOUNTER — Ambulatory Visit (HOSPITAL_COMMUNITY): Payer: Medicare Other | Attending: Orthopaedic Surgery

## 2021-02-27 ENCOUNTER — Encounter (HOSPITAL_COMMUNITY): Payer: Self-pay

## 2021-02-27 ENCOUNTER — Other Ambulatory Visit: Payer: Self-pay

## 2021-02-27 DIAGNOSIS — M25511 Pain in right shoulder: Secondary | ICD-10-CM | POA: Diagnosis present

## 2021-02-27 DIAGNOSIS — R29898 Other symptoms and signs involving the musculoskeletal system: Secondary | ICD-10-CM | POA: Insufficient documentation

## 2021-02-27 DIAGNOSIS — M25611 Stiffness of right shoulder, not elsewhere classified: Secondary | ICD-10-CM | POA: Insufficient documentation

## 2021-02-27 NOTE — Patient Instructions (Signed)
Sling on at all times except for hygiene, dressing, and home exercises.  May take sling off if sitting in recliner or couch with pillows under arm to support shoulder.  Use ice as needed for pain throughout the day. 15-20 minutes at a time.   START THE FOLLOWING EXERCISES NOW:    COMPLETE PENDULUM EXERCISES FOR 30 SECONDS TO A MINUTE EACH, 3-5 TIMES PER DAY. ROM: Pendulum (Side-to-Side)  http://orth.exer.us/792   Copyright  VHI. All rights reserved.  Pendulum Forward/Back   Bend forward 90 at waist, using table for support. Rock body forward and back to swing arm. Repeat ____ times. Do ____ sessions per day.    Copyright  VHI. All rights reserved.  AROM: Wrist Extension   With right palm down, bend wrist up. Repeat 10____ times per set. Do ____ sets per session. Do __3__ sessions per day.  Copyright  VHI. All rights reserved.   AROM: Wrist Flexion   With right palm up, bend wrist up. Repeat ___10_ times per set. Do ____ sets per session. Do __3__ sessions per day.  Copyright  VHI. All rights reserved.   AROM: Forearm Pronation / Supination   With right arm in handshake position, slowly rotate palm down until stretch is felt. Relax. Then rotate palm up until stretch is felt. Repeat __10__ times per set. Do ____ sets per session. Do __3__ sessions per day.  ELBOW FLEXION EXTENSION  Start with your arm at your side. Bend at your elbow to raise your forearm/hand upwards as shown. Then return to starting position and repeat. Complete 10 times.      START THIS EXERCISES ON Thursday 03/02/21: 1) SHOULDER: Flexion On Table   Place hands on towel placed on table, elbows straight. Lean forward with you upper body, pushing towel away from body.  __10_ reps per set, 3-5 TIMES A DAY  2) Abduction (Passive)   With arm out to side, resting on towel placed on table with palm DOWN, keeping trunk away from table, lean to the side while pushing towel away from body.   Repeat __10__ times. 3-5 TIMES A DAY Copyright  VHI. All rights reserved.     3) Internal Rotation (Assistive)   Seated with elbow bent at right angle and held against side, slide arm on table surface in an inward arc keeping elbow anchored in place. Repeat __10__ times. 3-5 TIMES A DAY. Activity: Use this motion to brush crumbs off the table.  Copyright  VHI. All rights reserved.

## 2021-02-27 NOTE — Therapy (Signed)
[facility] [address] [place], [place], [address] Phone: [phone]   Fax:  [phone]  Occupational Therapy Evaluation  Patient Details  Name: KYLIN GENNA MRN: [id number] Date of Birth: 1940/12/11 Referring Provider (OT): Thane Edu, MD   Encounter Date: 02/27/2021   OT End of Session - 02/27/21 1652     Visit Number 1    Number of Visits 24    Date for OT Re-Evaluation 05/22/21   mini reassess: 03/27/21   Authorization Type 1) Medicare A & B 2) Tricare for life    Progress Note Due on Visit 10    OT Start Time 1030    OT Stop Time 1115    OT Time Calculation (min) 45 min    Activity Tolerance Patient tolerated treatment well    Behavior During Therapy [facility] for tasks assessed/performed             Past Medical History:  Diagnosis Date   Arthritis    Diabetes mellitus without complication (HCC)    History of kidney stones    HOH (hard of hearing)    Hyperlipidemia    Hypertension    Hypothyroidism     Past Surgical History:  Procedure Laterality Date   COLONOSCOPY  2007   Dr. Karilyn Cota: diverticulosis   COLONOSCOPY WITH PROPOFOL N/A 04/24/2019   Dr. Jena Gauss: Diverticulosis in the sigmoid colon, two 5 to 9 mm polyps removed from the descending and ascending colon.  Pathology revealed both to be benign polyps with no adenomatous changes. No future surveillance colonoscopies due to age.   ESOPHAGOGASTRODUODENOSCOPY (EGD) WITH PROPOFOL N/A 04/24/2019   Dr. Jena Gauss: Small hiatal hernia, one gastric polyp benign.   POLYPECTOMY  04/24/2019   Procedure: POLYPECTOMY;  Surgeon: Corbin Ade, MD;  [facility];  Service: Endoscopy;;  ascending;   REVERSE SHOULDER ARTHROPLASTY Right 02/16/2021   Procedure: REVERSE SHOULDER ARTHROPLASTY;  Surgeon: Oliver Barre, MD;  Location: [facility];  Service: Orthopedics;  Laterality: Right;    There were no vitals filed for this visit.   Subjective Assessment - 02/27/21 1034      Subjective  S: I finally had surgery. I couldn't take it anymore.    Pertinent History Patient is a 80 y/o female S/P right shoulder arthroscopy competed on 02/16/21. Dr. Dallas Schimke has referred patient to occupational therapy for evaluation and treatment.    Patient Stated Goals To be able to use my arm again with no pain.    Currently in Pain? Yes    Pain Score 4     Pain Location Back    Pain Orientation Upper    Pain Descriptors / Indicators Aching    Pain Type Acute pain    Pain Onset More than a month ago    Pain Frequency Constant    Aggravating Factors  wearing the sling               [facility] OT Assessment - 02/27/21 1032       Assessment   Medical Diagnosis Right reverse shoulder arthroplasty    Referring Provider (OT) Thane Edu, MD    Onset Date/Surgical Date 02/16/21    Hand Dominance Right    Next MD Visit 03/01/21    Prior Therapy Pt received OP OT services at this clinic prior to surgery for right shoulder pain.      Precautions   Precautions Shoulder    Type of Shoulder Precautions See media tab for  protocol.      Restrictions   Weight Bearing Restrictions Yes    RUE Weight Bearing Non weight bearing      Balance Screen   Has the patient fallen in the past 6 months No      Home  Environment   Family/patient expects to be discharged to: Private residence      Prior Function   Level of Independence Independent    Vocation Retired      ADL   ADL comments Pt is unable to use her RUE for any daily tasks.      Mobility   Mobility Status Independent      Written Expression   Dominant Hand Right      Vision - History   Baseline Vision Wears glasses all the time      Cognition   Overall Cognitive Status Within Functional Limits for tasks assessed      Observation/Other Assessments   Focus on Therapeutic Outcomes (FOTO)  30/100      Posture/Postural Control   Posture/Postural Control Postural limitations    Postural Limitations Rounded  Shoulders;Forward head      ROM / Strength   AROM / PROM / Strength AROM;PROM;Strength      Palpation   Palpation comment Max fascial restrictions noted in the right upper arm, upper trapezius, and scapularis region.      AROM   Overall AROM  Unable to assess;Due to precautions      PROM   Overall PROM Comments Assessed supine, er/IR adducted    PROM Assessment Site Shoulder    Right/Left Shoulder Right    Right Shoulder Flexion 99 Degrees    Right Shoulder ABduction 94 Degrees    Right Shoulder Internal Rotation 80 Degrees    Right Shoulder External Rotation 9 Degrees      Strength   Overall Strength Unable to assess;Due to precautions                              OT Education - 02/27/21 1651     Education Details HEP. Discussed which exercises to complete now and which ones to begin on 03/02/21. Shoulder positioning when seated (may remove sling and prop arm on pillows)    Person(s) Educated Patient    Methods Explanation;Demonstration;Verbal cues;Handout;Tactile cues    Comprehension Verbalized understanding;Returned demonstration;Need further instruction              OT Short Term Goals - 02/27/21 1656       OT SHORT TERM GOAL #1   Title Pt will be provided with and educated on HEP to improve mobility required for RUE use as dominant during daily tasks.    Time 6    Period Weeks    Status New    Target Date 04/10/21      OT SHORT TERM GOAL #2   Title Patient will report a pain level of approximately 5/10 when using her RUE to complete daily tasks.    Time 6    Period Weeks    Status New      OT SHORT TERM GOAL #3   Title Pt will decrease RUE fascial restrictions to minimal amounts or less to improve ability to perform functional reaching tasks.    Time 6    Period Weeks    Status New      OT SHORT TERM GOAL #4   Title Pt will increase RUE P/ROM  to [facility] to improve ability to complete waist level reaching tasks and assist with dressing  and bathing.    Time 6    Period Weeks    Status New      OT SHORT TERM GOAL #5   Title Pt will increase RUE strength to 3/5 or greater to improve ability to reach to shoulder level or below.    Time 6    Period Weeks    Status New               OT Long Term Goals - 02/27/21 1657       OT LONG TERM GOAL #1   Title Patient will increase her RUE A/ROM to [facility] in order to return to functional reaching tasks at and above shoulder level.    Time 12    Period Weeks    Status New    Target Date 05/22/21      OT LONG TERM GOAL #2   Title Patient will increase her RUE strength to 4/5 in order to return to lifting and managing items of moderate weight at home.    Time 12    Period Weeks    Status New      OT LONG TERM GOAL #3   Title Patient will report a pain level of approximately 3/10 or less in her RUE when completing all self care tasks.    Time 12    Period Weeks    Status New      OT LONG TERM GOAL #4   Title Patient will decrease her right shoulder fascial restrictions to min amount or less in order to increase the functional mobility needed to complete reaching tasks.    Time 12    Period Weeks    Status New                   Plan - 02/27/21 1653     Clinical Impression Statement A: Patient is a 80 y/o female S/P right RTC repair causing increased pain, fascial restrictions and decreased ROM and strength resulting in being unable to use her RUE for any daily tasks.    OT Occupational Profile and History Problem Focused Assessment - Including review of records relating to presenting problem    Occupational performance deficits (Please refer to evaluation for details): ADL's;IADL's;Rest and Sleep;Leisure    Body Structure / Function / Physical Skills ADL;Endurance;UE functional use;Fascial restriction;Pain;ROM;IADL;Strength    Rehab Potential Excellent    Clinical Decision Making Several treatment options, min-mod task modification necessary     Comorbidities Affecting Occupational Performance: May have comorbidities impacting occupational performance    Modification or Assistance to Complete Evaluation  Min-Moderate modification of tasks or assist with assess necessary to complete eval    OT Frequency 2x / week    OT Duration 12 weeks    OT Treatment/Interventions Self-care/ADL training;Ultrasound;Patient/family education;Passive range of motion;Cryotherapy;Electrical Stimulation;Splinting;Moist Heat;Therapeutic exercise;Manual Therapy;Therapeutic activities;Neuromuscular education    Plan P: Patient will benefit from skilled OT services to increase functional use of her RUE and return to using it as her dominant extremity for daily tasks. Treatment Plan: Follow protocol. Myofascial release, manual stretching, P/ROM, AA/ROM, A/ROM, general strengthening. Next session: Review table slides and instruct on completing at home. HEP has already been provided.    OT Home Exercise Plan eval: table slides, A/ROM wrist, elbow, forearm, pendulums.    Consulted and Agree with Plan of Care Patient  Patient will benefit from skilled therapeutic intervention in order to improve the following deficits and impairments:   Body Structure / Function / Physical Skills: ADL, Endurance, UE functional use, Fascial restriction, Pain, ROM, IADL, Strength       Visit Diagnosis: Stiffness of right shoulder, not elsewhere classified - Plan: Ot plan of care cert/re-cert  Other symptoms and signs involving the musculoskeletal system - Plan: Ot plan of care cert/re-cert  Acute pain of right shoulder - Plan: Ot plan of care cert/re-cert    Problem List Patient Active Problem List   Diagnosis Date Noted   Arthritis of right glenohumeral joint 02/16/2021   Impacted cerumen of both ears 12/01/2020   HTN (hypertension) 10/19/2020   HLD (hyperlipidemia) 10/19/2020   Hypothyroidism 10/19/2020   Primary osteoarthritis of right shoulder 10/19/2020    Macular pucker, left eye 08/27/2019   Macular retinoschisis, left 08/27/2019   Posterior vitreous detachment of both eyes 08/27/2019   Type 2 diabetes mellitus with other specified complication (HCC) 08/27/2019   Iron deficiency anemia 04/06/2019    Limmie Patricia, OTR/L,CBIS  [phone]  02/27/2021, 5:00 PM  [facility] [address] [place], [place], [address] Phone: [phone]   Fax:  [phone]  Name: TINSLEE KLARE MRN: [id number] Date of Birth: 04-21-1940

## 2021-03-01 ENCOUNTER — Ambulatory Visit (INDEPENDENT_AMBULATORY_CARE_PROVIDER_SITE_OTHER): Payer: Medicare Other | Admitting: Orthopedic Surgery

## 2021-03-01 ENCOUNTER — Ambulatory Visit: Payer: Medicare Other

## 2021-03-01 ENCOUNTER — Encounter: Payer: Self-pay | Admitting: Orthopedic Surgery

## 2021-03-01 ENCOUNTER — Ambulatory Visit (HOSPITAL_COMMUNITY): Payer: Medicare Other | Admitting: Occupational Therapy

## 2021-03-01 ENCOUNTER — Other Ambulatory Visit: Payer: Self-pay

## 2021-03-01 DIAGNOSIS — Z96611 Presence of right artificial shoulder joint: Secondary | ICD-10-CM

## 2021-03-01 NOTE — Progress Notes (Signed)
Orthopaedic Postop Note  Assessment: Lisa Beck is a 80 y.o. female s/p Right Reverse Shoulder Arthroplasty  DOS: 02/16/2021  Plan: Sutures were trimmed, steri strips were placed Ok to remove the abduction pillow; can stop using the sling around 4 weeks postop Physical therapy prescription and protocol provided Anticipated progression discussed, XR reviewed in clinic Follow up 4 weeks   Follow-up: Return in about 4 weeks (around 03/29/2021).  XR at next visit: Right shoulder  Subjective:  Chief Complaint  Patient presents with   Routine Post Op    DOS 02/16/21  Reverse Shoulder Arthoplasty RT    History of Present Illness: Lisa Beck is a 80 y.o. female who presents following the above stated procedure.  Surgery was 2 weeks ago.  She has worked with physical therapy, and is doing well.  Pain is controlled with Tylenol.  No numbness or tingling.  She is tolerated the sling, although she will we will be happy to get rid of the pillow.  Review of Systems: No fevers or chills No numbness or tingling No Chest Pain No shortness of breath   Objective: There were no vitals taken for this visit.  Physical Exam:  Alert and oriented.  No acute distress.  Evaluation of right shoulder demonstrates a healing anterior based shoulder incision.  No surrounding erythema or drainage.  Sensation is intact in the axillary nerve distribution.  Sensation is intact throughout the right hand.  She tolerates forward flexion to 100 degrees.  Fingers warm and well-perfused.  2+ radial pulse.  IMAGING: I personally ordered and reviewed the following images:  XR of the Right shoulder obtained in clinic today and demonstrates shoulder arthroplasty with implants in good position.  No evidence of acute injury or subsidence of implants.   Impression: Right shoulder arthroplasty in good position   Oliver Barre, MD 03/01/2021 11:46 AM

## 2021-03-01 NOTE — Patient Instructions (Signed)
OK to stop using the pillow  Stop using sling in 2 more weeks  Keep working with PT  Call with questions  Follow up in 4 weeks

## 2021-03-03 ENCOUNTER — Encounter (HOSPITAL_COMMUNITY): Payer: Self-pay

## 2021-03-03 ENCOUNTER — Other Ambulatory Visit: Payer: Self-pay

## 2021-03-03 ENCOUNTER — Ambulatory Visit (HOSPITAL_COMMUNITY): Payer: Medicare Other

## 2021-03-03 DIAGNOSIS — M25611 Stiffness of right shoulder, not elsewhere classified: Secondary | ICD-10-CM

## 2021-03-03 DIAGNOSIS — M25511 Pain in right shoulder: Secondary | ICD-10-CM

## 2021-03-03 DIAGNOSIS — R29898 Other symptoms and signs involving the musculoskeletal system: Secondary | ICD-10-CM

## 2021-03-03 NOTE — Therapy (Signed)
[facility] [address] [place], [place], [address] Phone: [phone]   Fax:  [phone]  Occupational Therapy Treatment  Patient Details  Name: Lisa Beck MRN: [id number] Date of Birth: Aug 09, 1940 Referring Provider (OT): Thane Edu, MD   Encounter Date: 03/03/2021   OT End of Session - 03/03/21 1145     Visit Number 2    Number of Visits 24    Date for OT Re-Evaluation 05/22/21   mini reassess: 03/27/21   Authorization Type 1) Medicare A & B 2) Tricare for life    Progress Note Due on Visit 10    OT Start Time 1115    OT Stop Time 1153    OT Time Calculation (min) 38 min    Activity Tolerance Patient tolerated treatment well    Behavior During Therapy [facility] for tasks assessed/performed             Past Medical History:  Diagnosis Date   Arthritis    Diabetes mellitus without complication (HCC)    History of kidney stones    HOH (hard of hearing)    Hyperlipidemia    Hypertension    Hypothyroidism     Past Surgical History:  Procedure Laterality Date   COLONOSCOPY  2007   Dr. Karilyn Cota: diverticulosis   COLONOSCOPY WITH PROPOFOL N/A 04/24/2019   Dr. Jena Gauss: Diverticulosis in the sigmoid colon, two 5 to 9 mm polyps removed from the descending and ascending colon.  Pathology revealed both to be benign polyps with no adenomatous changes. No future surveillance colonoscopies due to age.   ESOPHAGOGASTRODUODENOSCOPY (EGD) WITH PROPOFOL N/A 04/24/2019   Dr. Jena Gauss: Small hiatal hernia, one gastric polyp benign.   POLYPECTOMY  04/24/2019   Procedure: POLYPECTOMY;  Surgeon: Corbin Ade, MD;  [facility];  Service: Endoscopy;;  ascending;   REVERSE SHOULDER ARTHROPLASTY Right 02/16/2021   Procedure: REVERSE SHOULDER ARTHROPLASTY;  Surgeon: Oliver Barre, MD;  Location: [facility];  Service: Orthopedics;  Laterality: Right;    There were no vitals filed for this visit.   Subjective Assessment - 03/03/21 1123      Subjective  S: He took the pillow off my sling.    Currently in Pain? Yes    Pain Score 5     Pain Location Shoulder    Pain Orientation Anterior;Right    Pain Descriptors / Indicators Aching    Pain Type Surgical pain    Pain Radiating Towards none    Pain Onset More than a month ago    Pain Frequency Constant    Aggravating Factors  wearing the sling    Pain Relieving Factors Nothing    Effect of Pain on Daily Activities pt is unable to utilize her right UE for any daily tasks.    Multiple Pain Sites No                [facility] OT Assessment - 03/03/21 1144       Assessment   Medical Diagnosis Right reverse shoulder arthroplasty      Precautions   Precautions Shoulder    Type of Shoulder Precautions See media tab for protocol.                      OT Treatments/Exercises (OP) - 03/03/21 1142       Exercises   Exercises Shoulder      Shoulder Exercises: Supine   Protraction PROM;5 reps  Horizontal ABduction PROM;5 reps    External Rotation PROM;5 reps    Internal Rotation PROM;5 reps    Flexion PROM;5 reps    ABduction PROM;5 reps      Shoulder Exercises: Seated   Extension AROM;10 reps    Row AROM;10 reps    Other Seated Exercises table slides; 10X each      Manual Therapy   Manual Therapy Myofascial release    Manual therapy comments completed separately from therapeutic exercises    Myofascial Release myofascial release and manual techniques to right upper arm, anterior shoulder, and trapezius regions to decrease pain and fascial restrictions and increase joint ROM                    OT Education - 03/03/21 1726     Education Details Discussed HEP. Patient should now be completing all exercises provided at evaluation. Reviewed with patient.    Person(s) Educated Patient    Methods Explanation    Comprehension Verbalized understanding              OT Short Term Goals - 03/03/21 1146       OT SHORT TERM GOAL #1   Title  Pt will be provided with and educated on HEP to improve mobility required for RUE use as dominant during daily tasks.    Time 6    Period Weeks    Status On-going    Target Date 04/10/21      OT SHORT TERM GOAL #2   Title Patient will report a pain level of approximately 5/10 when using her RUE to complete daily tasks.    Time 6    Period Weeks    Status On-going      OT SHORT TERM GOAL #3   Title Pt will decrease RUE fascial restrictions to minimal amounts or less to improve ability to perform functional reaching tasks.    Time 6    Period Weeks    Status On-going      OT SHORT TERM GOAL #4   Title Pt will increase RUE P/ROM to [facility] to improve ability to complete waist level reaching tasks and assist with dressing and bathing.    Time 6    Period Weeks    Status On-going      OT SHORT TERM GOAL #5   Title Pt will increase RUE strength to 3/5 or greater to improve ability to reach to shoulder level or below.    Time 6    Period Weeks    Status On-going               OT Long Term Goals - 03/03/21 1207       OT LONG TERM GOAL #1   Title Patient will increase her RUE A/ROM to [facility] in order to return to functional reaching tasks at and above shoulder level.    Time 12    Period Weeks    Status On-going    Target Date 05/22/21      OT LONG TERM GOAL #2   Title Patient will increase her RUE strength to 4/5 in order to return to lifting and managing items of moderate weight at home.    Time 12    Period Weeks    Status On-going      OT LONG TERM GOAL #3   Title Patient will report a pain level of approximately 3/10 or less in her RUE when completing all self care tasks.  Time 12    Period Weeks    Status On-going      OT LONG TERM GOAL #4   Title Patient will decrease her right shoulder fascial restrictions to min amount or less in order to increase the functional mobility needed to complete reaching tasks.    Time 12    Period Weeks    Status On-going                    Plan - 03/03/21 1729     Clinical Impression Statement A: Initiated myofascial release to right upper arm and upper trapezius region to address minimal fascial restrictions. Patient with maximum muscle guarding during passive ROM. Unable to relax and actively held UE frequently. Provided VC for from and technique during session for all exercises.    Body Structure / Function / Physical Skills ADL;Endurance;UE functional use;Fascial restriction;Pain;ROM;IADL;Strength    Plan P: Continue to follow protocol while decreasing muscle guarding in order to increase ROM tolerance. Therapy ball stretches, low level thumb tacks.    Consulted and Agree with Plan of Care Patient             Patient will benefit from skilled therapeutic intervention in order to improve the following deficits and impairments:   Body Structure / Function / Physical Skills: ADL, Endurance, UE functional use, Fascial restriction, Pain, ROM, IADL, Strength       Visit Diagnosis: Stiffness of right shoulder, not elsewhere classified  Acute pain of right shoulder  Other symptoms and signs involving the musculoskeletal system    Problem List Patient Active Problem List   Diagnosis Date Noted   Arthritis of right glenohumeral joint 02/16/2021   Impacted cerumen of both ears 12/01/2020   HTN (hypertension) 10/19/2020   HLD (hyperlipidemia) 10/19/2020   Hypothyroidism 10/19/2020   Primary osteoarthritis of right shoulder 10/19/2020   Macular pucker, left eye 08/27/2019   Macular retinoschisis, left 08/27/2019   Posterior vitreous detachment of both eyes 08/27/2019   Type 2 diabetes mellitus with other specified complication (HCC) 08/27/2019   Iron deficiency anemia 04/06/2019    Limmie Patricia, OTR/L,CBIS  [phone]  03/03/2021, 5:38 PM  [facility] [address] [place], [place], [address] Phone: [phone]   Fax:   [phone]  Name: JOZETTE CASTRELLON MRN: [id number] Date of Birth: 05-25-1940

## 2021-03-06 ENCOUNTER — Ambulatory Visit (HOSPITAL_COMMUNITY): Payer: Medicare Other

## 2021-03-06 ENCOUNTER — Encounter (HOSPITAL_COMMUNITY): Payer: Self-pay

## 2021-03-06 ENCOUNTER — Other Ambulatory Visit: Payer: Self-pay

## 2021-03-06 DIAGNOSIS — M25611 Stiffness of right shoulder, not elsewhere classified: Secondary | ICD-10-CM

## 2021-03-06 DIAGNOSIS — R29898 Other symptoms and signs involving the musculoskeletal system: Secondary | ICD-10-CM

## 2021-03-06 DIAGNOSIS — M25511 Pain in right shoulder: Secondary | ICD-10-CM

## 2021-03-06 NOTE — Therapy (Signed)
[place], [place], [address] Phone: [phone]   Fax:  [phone]  Occupational Therapy Treatment  Patient Details  Name: Lisa Beck MRN: [id number] Date of Birth: 1940-11-12 Referring Provider (OT): Larena Glassman, MD   Encounter Date: 03/06/2021   OT End of Session - 03/06/21 1217     Visit Number 3    Number of Visits 24    Date for OT Re-Evaluation 05/22/21   mini reassess: 03/27/21   Authorization Type 1) Medicare A & B 2) Tricare for life    Progress Note Due on Visit 10    OT Start Time 1115    OT Stop Time 1153    OT Time Calculation (min) 38 min    Activity Tolerance Patient tolerated treatment well    Behavior During Therapy [facility] for tasks assessed/performed             Past Medical History:  Diagnosis Date   Arthritis    Diabetes mellitus without complication ([place])    History of kidney stones    HOH (hard of hearing)    Hyperlipidemia    Hypertension    Hypothyroidism     Past Surgical History:  Procedure Laterality Date   COLONOSCOPY  2007   Dr. Laural Golden: diverticulosis   COLONOSCOPY WITH PROPOFOL N/A 04/24/2019   Dr. Gala Romney: Diverticulosis in the sigmoid colon, two 5 to 9 mm polyps removed from the descending and ascending colon.  Pathology revealed both to be benign polyps with no adenomatous changes. No future surveillance colonoscopies due to age.   ESOPHAGOGASTRODUODENOSCOPY (EGD) WITH PROPOFOL N/A 04/24/2019   Dr. Gala Romney: Small hiatal hernia, one gastric polyp benign.   POLYPECTOMY  04/24/2019   Procedure: POLYPECTOMY;  Surgeon: Daneil Dolin, MD;  [facility];  Service: Endoscopy;;  ascending;   REVERSE SHOULDER ARTHROPLASTY Right 02/16/2021   Procedure: REVERSE SHOULDER ARTHROPLASTY;  Surgeon: Mordecai Rasmussen, MD;  Location: [facility];  Service: Orthopedics;  Laterality: Right;    There were no vitals filed for this visit.   Subjective Assessment - 03/06/21 1120      Subjective  S: I'm doing some of my exercises. I'm not doing them as much as I should be.    Currently in Pain? Yes    Pain Score 4     Pain Location Back    Pain Orientation Upper;Right    Pain Descriptors / Indicators Aching    Pain Type Acute pain    Pain Onset In the past 7 days    Pain Frequency Constant    Aggravating Factors  wearing the sling    Pain Relieving Factors nothing    Effect of Pain on Daily Activities pt is unable to utilize her RUE for any daily tasks.    Multiple Pain Sites No                [facility] OT Assessment - 03/06/21 1134       Assessment   Medical Diagnosis Right reverse shoulder arthroplasty      Precautions   Precautions Shoulder    Type of Shoulder Precautions See media tab for protocol.                      OT Treatments/Exercises (OP) - 03/06/21 1135       Exercises   Exercises Shoulder      Shoulder Exercises: Supine   Protraction PROM;5 reps  Horizontal ABduction PROM;5 reps    External Rotation PROM;5 reps    Internal Rotation PROM;5 reps    Flexion PROM;5 reps    ABduction PROM;5 reps      Shoulder Exercises: Seated   Extension AROM;10 reps    Row AROM;10 reps      Shoulder Exercises: Standing   Other Standing Exercises Scapular depression; 10X; A/ROM      Shoulder Exercises: ROM/Strengthening   Thumb Tacks 1' low level      Shoulder Exercises: Isometric Strengthening   Flexion Supine;3X3"    Extension Supine;3X3"    External Rotation Supine;3X3"    Internal Rotation Supine;3X3"    ABduction Supine;3X3"    ADduction Supine;3X3"      Manual Therapy   Manual Therapy Myofascial release    Manual therapy comments completed separately from therapeutic exercises    Myofascial Release myofascial release and manual techniques to right upper arm, anterior shoulder, and trapezius regions to decrease pain and fascial restrictions and increase joint ROM                      OT Short Term Goals -  03/03/21 1146       OT SHORT TERM GOAL #1   Title Pt will be provided with and educated on HEP to improve mobility required for RUE use as dominant during daily tasks.    Time 6    Period Weeks    Status On-going    Target Date 04/10/21      OT SHORT TERM GOAL #2   Title Patient will report a pain level of approximately 5/10 when using her RUE to complete daily tasks.    Time 6    Period Weeks    Status On-going      OT SHORT TERM GOAL #3   Title Pt will decrease RUE fascial restrictions to minimal amounts or less to improve ability to perform functional reaching tasks.    Time 6    Period Weeks    Status On-going      OT SHORT TERM GOAL #4   Title Pt will increase RUE P/ROM to [facility] to improve ability to complete waist level reaching tasks and assist with dressing and bathing.    Time 6    Period Weeks    Status On-going      OT SHORT TERM GOAL #5   Title Pt will increase RUE strength to 3/5 or greater to improve ability to reach to shoulder level or below.    Time 6    Period Weeks    Status On-going               OT Long Term Goals - 03/03/21 1207       OT LONG TERM GOAL #1   Title Patient will increase her RUE A/ROM to [facility] in order to return to functional reaching tasks at and above shoulder level.    Time 12    Period Weeks    Status On-going    Target Date 05/22/21      OT LONG TERM GOAL #2   Title Patient will increase her RUE strength to 4/5 in order to return to lifting and managing items of moderate weight at home.    Time 12    Period Weeks    Status On-going      OT LONG TERM GOAL #3   Title Patient will report a pain level of approximately 3/10 or less in  her RUE when completing all self care tasks.    Time 12    Period Weeks    Status On-going      OT LONG TERM GOAL #4   Title Patient will decrease her right shoulder fascial restrictions to min amount or less in order to increase the functional mobility needed to complete reaching tasks.     Time 12    Period Weeks    Status On-going                   Plan - 03/06/21 1218     Clinical Impression Statement A: Pt able to decrease muscle guarding slightly this session compared to previous session. Able to tolerate P/ROM shoulder flexion and abduction to approximately 90 degrees. Manual techniques completed to address fascial restrictions and scar adhesions in the right upper arm, upper trapezius, and scapularis region. Added isometrics and thumb tacks. VC for form and technique. Occassionally required UE positioining for correct form. Unable to complete pro/ret/elev/dep due to inability to place hand on wall due to lacking ROM for external rotation. Will hold exercise until ROM progresses.    Body Structure / Function / Physical Skills ADL;Endurance;UE functional use;Fascial restriction;Pain;ROM;IADL;Strength    Plan P: Continue to follow protocol while decreasing muscle guarding in order to increase ROM tolerance.    Consulted and Agree with Plan of Care Patient             Patient will benefit from skilled therapeutic intervention in order to improve the following deficits and impairments:   Body Structure / Function / Physical Skills: ADL, Endurance, UE functional use, Fascial restriction, Pain, ROM, IADL, Strength       Visit Diagnosis: Stiffness of right shoulder, not elsewhere classified  Acute pain of right shoulder  Other symptoms and signs involving the musculoskeletal system    Problem List Patient Active Problem List   Diagnosis Date Noted   Arthritis of right glenohumeral joint 02/16/2021   Impacted cerumen of both ears 12/01/2020   HTN (hypertension) 10/19/2020   HLD (hyperlipidemia) 10/19/2020   Hypothyroidism 10/19/2020   Primary osteoarthritis of right shoulder 10/19/2020   Macular pucker, left eye 08/27/2019   Macular retinoschisis, left 08/27/2019   Posterior vitreous detachment of both eyes 08/27/2019   Type 2 diabetes mellitus with  other specified complication ([place]) 83/11/4074   Iron deficiency anemia 04/06/2019    Ailene Ravel, OTR/L,CBIS  [phone]  03/06/2021, 12:21 PM  [place], [place], [address] Phone: [phone]   Fax:  [phone]  Name: Lisa Beck MRN: [id number] Date of Birth: January 07, 1941

## 2021-03-07 ENCOUNTER — Other Ambulatory Visit: Payer: Self-pay | Admitting: Internal Medicine

## 2021-03-08 ENCOUNTER — Other Ambulatory Visit: Payer: Self-pay

## 2021-03-08 ENCOUNTER — Encounter (HOSPITAL_COMMUNITY): Payer: Self-pay | Admitting: Occupational Therapy

## 2021-03-08 ENCOUNTER — Ambulatory Visit (HOSPITAL_COMMUNITY): Payer: Medicare Other | Admitting: Occupational Therapy

## 2021-03-08 DIAGNOSIS — M25611 Stiffness of right shoulder, not elsewhere classified: Secondary | ICD-10-CM | POA: Diagnosis not present

## 2021-03-08 DIAGNOSIS — R29898 Other symptoms and signs involving the musculoskeletal system: Secondary | ICD-10-CM

## 2021-03-08 DIAGNOSIS — M25511 Pain in right shoulder: Secondary | ICD-10-CM

## 2021-03-08 NOTE — Therapy (Signed)
[facility] [address] [place], [place], [address] Phone: [phone]   Fax:  [phone]  Occupational Therapy Treatment  Patient Details  Name: Lisa Beck MRN: [id number] Date of Birth: 12/22/1940 Referring Provider (OT): Thane Edu, MD   Encounter Date: 03/08/2021   OT End of Session - 03/08/21 1343     Visit Number 4    Number of Visits 24    Date for OT Re-Evaluation 05/22/21   mini reassess: 03/27/21   Authorization Type 1) Medicare A & B 2) Tricare for life    Progress Note Due on Visit 10    OT Start Time 1301    OT Stop Time 1339    OT Time Calculation (min) 38 min    Activity Tolerance Patient tolerated treatment well    Behavior During Therapy [facility] for tasks assessed/performed             Past Medical History:  Diagnosis Date   Arthritis    Diabetes mellitus without complication (HCC)    History of kidney stones    HOH (hard of hearing)    Hyperlipidemia    Hypertension    Hypothyroidism     Past Surgical History:  Procedure Laterality Date   COLONOSCOPY  2007   Dr. Karilyn Cota: diverticulosis   COLONOSCOPY WITH PROPOFOL N/A 04/24/2019   Dr. Jena Gauss: Diverticulosis in the sigmoid colon, two 5 to 9 mm polyps removed from the descending and ascending colon.  Pathology revealed both to be benign polyps with no adenomatous changes. No future surveillance colonoscopies due to age.   ESOPHAGOGASTRODUODENOSCOPY (EGD) WITH PROPOFOL N/A 04/24/2019   Dr. Jena Gauss: Small hiatal hernia, one gastric polyp benign.   POLYPECTOMY  04/24/2019   Procedure: POLYPECTOMY;  Surgeon: Corbin Ade, MD;  [facility];  Service: Endoscopy;;  ascending;   REVERSE SHOULDER ARTHROPLASTY Right 02/16/2021   Procedure: REVERSE SHOULDER ARTHROPLASTY;  Surgeon: Oliver Barre, MD;  Location: [facility];  Service: Orthopedics;  Laterality: Right;    There were no vitals filed for this visit.   Subjective Assessment - 03/08/21 1301      Subjective  S: I got through all my exercises yesterday.    Currently in Pain? No/denies                [facility] OT Assessment - 03/08/21 1259       Assessment   Medical Diagnosis Right reverse shoulder arthroplasty      Precautions   Precautions Shoulder    Type of Shoulder Precautions See media tab for protocol.                      OT Treatments/Exercises (OP) - 03/08/21 1303       Exercises   Exercises Shoulder      Shoulder Exercises: Supine   Protraction PROM;5 reps    Horizontal ABduction PROM;5 reps    External Rotation PROM;5 reps    Internal Rotation PROM;5 reps    Flexion PROM;5 reps    ABduction PROM;5 reps      Shoulder Exercises: Seated   Extension AROM;10 reps    Row AROM;10 reps    Other Seated Exercises scapular depression, A/ROM, 10X      Shoulder Exercises: Therapy Ball   Flexion 10 reps    ABduction 10 reps      Shoulder Exercises: ROM/Strengthening   Thumb Tacks 1' low level      Shoulder Exercises:  Isometric Strengthening   Flexion Supine;3X3"    Extension Supine;3X3"    External Rotation Supine;3X3"    Internal Rotation Supine;3X3"    ABduction Supine;3X3"    ADduction Supine;3X3"      Manual Therapy   Manual Therapy Myofascial release    Manual therapy comments completed separately from therapeutic exercises    Myofascial Release myofascial release and manual techniques to right upper arm, anterior shoulder, and trapezius regions to decrease pain and fascial restrictions and increase joint ROM                      OT Short Term Goals - 03/03/21 1146       OT SHORT TERM GOAL #1   Title Pt will be provided with and educated on HEP to improve mobility required for RUE use as dominant during daily tasks.    Time 6    Period Weeks    Status On-going    Target Date 04/10/21      OT SHORT TERM GOAL #2   Title Patient will report a pain level of approximately 5/10 when using her RUE to complete daily tasks.     Time 6    Period Weeks    Status On-going      OT SHORT TERM GOAL #3   Title Pt will decrease RUE fascial restrictions to minimal amounts or less to improve ability to perform functional reaching tasks.    Time 6    Period Weeks    Status On-going      OT SHORT TERM GOAL #4   Title Pt will increase RUE P/ROM to [facility] to improve ability to complete waist level reaching tasks and assist with dressing and bathing.    Time 6    Period Weeks    Status On-going      OT SHORT TERM GOAL #5   Title Pt will increase RUE strength to 3/5 or greater to improve ability to reach to shoulder level or below.    Time 6    Period Weeks    Status On-going               OT Long Term Goals - 03/03/21 1207       OT LONG TERM GOAL #1   Title Patient will increase her RUE A/ROM to [facility] in order to return to functional reaching tasks at and above shoulder level.    Time 12    Period Weeks    Status On-going    Target Date 05/22/21      OT LONG TERM GOAL #2   Title Patient will increase her RUE strength to 4/5 in order to return to lifting and managing items of moderate weight at home.    Time 12    Period Weeks    Status On-going      OT LONG TERM GOAL #3   Title Patient will report a pain level of approximately 3/10 or less in her RUE when completing all self care tasks.    Time 12    Period Weeks    Status On-going      OT LONG TERM GOAL #4   Title Patient will decrease her right shoulder fascial restrictions to min amount or less in order to increase the functional mobility needed to complete reaching tasks.    Time 12    Period Weeks    Status On-going  Plan - 03/08/21 1344     Clinical Impression Statement A: Pt reports she completed her exercises yesterday. Continued with myofascial release and manual techniques, passive stretching completed with pt able to tolerate slightly greater than 90 degrees flexion/abduction. Continued with isometrics and  thumb tacks, added therapy ball exercises. Verbal cuing for form and technique.    Body Structure / Function / Physical Skills ADL;Endurance;UE functional use;Fascial restriction;Pain;ROM;IADL;Strength    Plan P: Continue with protocol working towards greater ROM, increase isometrics to 3x5"    OT Home Exercise Plan eval: table slides, A/ROM wrist, elbow, forearm, pendulums.    Consulted and Agree with Plan of Care Patient             Patient will benefit from skilled therapeutic intervention in order to improve the following deficits and impairments:   Body Structure / Function / Physical Skills: ADL, Endurance, UE functional use, Fascial restriction, Pain, ROM, IADL, Strength       Visit Diagnosis: Stiffness of right shoulder, not elsewhere classified  Acute pain of right shoulder  Other symptoms and signs involving the musculoskeletal system    Problem List Patient Active Problem List   Diagnosis Date Noted   Arthritis of right glenohumeral joint 02/16/2021   Impacted cerumen of both ears 12/01/2020   HTN (hypertension) 10/19/2020   HLD (hyperlipidemia) 10/19/2020   Hypothyroidism 10/19/2020   Primary osteoarthritis of right shoulder 10/19/2020   Macular pucker, left eye 08/27/2019   Macular retinoschisis, left 08/27/2019   Posterior vitreous detachment of both eyes 08/27/2019   Type 2 diabetes mellitus with other specified complication (HCC) 08/27/2019   Iron deficiency anemia 04/06/2019    Ezra Sites, OTR/L  [phone] 03/08/2021, 1:46 PM   [facility] [address] [place], [place], [address] Phone: [phone]   Fax:  [phone]  Name: Lisa Beck MRN: [id number] Date of Birth: 1940/09/28

## 2021-03-14 ENCOUNTER — Ambulatory Visit (HOSPITAL_COMMUNITY): Payer: Medicare Other | Admitting: Occupational Therapy

## 2021-03-14 ENCOUNTER — Other Ambulatory Visit: Payer: Self-pay

## 2021-03-14 ENCOUNTER — Encounter (HOSPITAL_COMMUNITY): Payer: Self-pay | Admitting: Occupational Therapy

## 2021-03-14 ENCOUNTER — Telehealth: Payer: Self-pay | Admitting: Orthopedic Surgery

## 2021-03-14 DIAGNOSIS — M25611 Stiffness of right shoulder, not elsewhere classified: Secondary | ICD-10-CM | POA: Diagnosis not present

## 2021-03-14 DIAGNOSIS — M25511 Pain in right shoulder: Secondary | ICD-10-CM

## 2021-03-14 DIAGNOSIS — R29898 Other symptoms and signs involving the musculoskeletal system: Secondary | ICD-10-CM

## 2021-03-14 NOTE — Telephone Encounter (Signed)
Patient is inquiring about whether she would need to have pre-medication for dental cleaning appointment 03/20/21. If so, uses [facility] on [address] [place]

## 2021-03-14 NOTE — Therapy (Signed)
[place] [facility] [address] [place], [place], [address] Phone: [phone]   Fax:  [phone]  Occupational Therapy Treatment  Patient Details  Name: Lisa Beck MRN: [id number] Date of Birth: 09/28/1940 Referring Provider (OT): Thane Edu, MD   Encounter Date: 03/14/2021   OT End of Session - 03/14/21 1538     Visit Number 5    Number of Visits 24    Date for OT Re-Evaluation 05/22/21   mini reassess: 03/27/21   Authorization Type 1) Medicare A & B 2) Tricare for life    Progress Note Due on Visit 10    OT Start Time 1504    OT Stop Time 1537    OT Time Calculation (min) 33 min    Activity Tolerance Patient tolerated treatment well    Behavior During Therapy [facility] for tasks assessed/performed             Past Medical History:  Diagnosis Date   Arthritis    Diabetes mellitus without complication (HCC)    History of kidney stones    HOH (hard of hearing)    Hyperlipidemia    Hypertension    Hypothyroidism     Past Surgical History:  Procedure Laterality Date   COLONOSCOPY  2007   Dr. Karilyn Cota: diverticulosis   COLONOSCOPY WITH PROPOFOL N/A 04/24/2019   Dr. Jena Gauss: Diverticulosis in the sigmoid colon, two 5 to 9 mm polyps removed from the descending and ascending colon.  Pathology revealed both to be benign polyps with no adenomatous changes. No future surveillance colonoscopies due to age.   ESOPHAGOGASTRODUODENOSCOPY (EGD) WITH PROPOFOL N/A 04/24/2019   Dr. Jena Gauss: Small hiatal hernia, one gastric polyp benign.   POLYPECTOMY  04/24/2019   Procedure: POLYPECTOMY;  Surgeon: Corbin Ade, MD;  [facility];  Service: Endoscopy;;  ascending;   REVERSE SHOULDER ARTHROPLASTY Right 02/16/2021   Procedure: REVERSE SHOULDER ARTHROPLASTY;  Surgeon: Oliver Barre, MD;  Location: [facility];  Service: Orthopedics;  Laterality: Right;    There were no vitals filed for this visit.   Subjective Assessment - 03/14/21 1506      Subjective  S: I can take my sling off tomorrow.    Currently in Pain? No/denies                [facility] OT Assessment - 03/14/21 1505       Assessment   Medical Diagnosis Right reverse shoulder arthroplasty      Precautions   Precautions Shoulder    Type of Shoulder Precautions See media tab for protocol.                      OT Treatments/Exercises (OP) - 03/14/21 1507       Exercises   Exercises Shoulder      Shoulder Exercises: Supine   Protraction PROM;5 reps    Horizontal ABduction PROM;5 reps    External Rotation PROM;5 reps    Internal Rotation PROM;5 reps    Flexion PROM;5 reps    ABduction PROM;5 reps      Shoulder Exercises: Seated   Extension AROM;15 reps    Row AROM;15 reps    External Rotation Theraband;10 reps    Theraband Level (Shoulder External Rotation) Level 2 (Red)    Flexion Theraband;10 reps    Theraband Level (Shoulder Flexion) Level 2 (Red)    Other Seated Exercises scapular depression, A/ROM, 15X      Shoulder Exercises: Therapy  Ball   Flexion 15 reps    ABduction 15 reps      Shoulder Exercises: ROM/Strengthening   Wall Wash 1' low level    Thumb Tacks 1' low level      Shoulder Exercises: Isometric Strengthening   Flexion Supine;3X5"    Extension Supine;3X5"    External Rotation Supine;3X5"    Internal Rotation Supine;3X5"    ABduction Supine;3X5"    ADduction Supine;3X5"      Manual Therapy   Manual Therapy Myofascial release    Manual therapy comments completed separately from therapeutic exercises    Myofascial Release myofascial release and manual techniques to right upper arm, anterior shoulder, and trapezius regions to decrease pain and fascial restrictions and increase joint ROM                      OT Short Term Goals - 03/03/21 1146       OT SHORT TERM GOAL #1   Title Pt will be provided with and educated on HEP to improve mobility required for RUE use as dominant during daily tasks.     Time 6    Period Weeks    Status On-going    Target Date 04/10/21      OT SHORT TERM GOAL #2   Title Patient will report a pain level of approximately 5/10 when using her RUE to complete daily tasks.    Time 6    Period Weeks    Status On-going      OT SHORT TERM GOAL #3   Title Pt will decrease RUE fascial restrictions to minimal amounts or less to improve ability to perform functional reaching tasks.    Time 6    Period Weeks    Status On-going      OT SHORT TERM GOAL #4   Title Pt will increase RUE P/ROM to [facility] to improve ability to complete waist level reaching tasks and assist with dressing and bathing.    Time 6    Period Weeks    Status On-going      OT SHORT TERM GOAL #5   Title Pt will increase RUE strength to 3/5 or greater to improve ability to reach to shoulder level or below.    Time 6    Period Weeks    Status On-going               OT Long Term Goals - 03/03/21 1207       OT LONG TERM GOAL #1   Title Patient will increase her RUE A/ROM to [facility] in order to return to functional reaching tasks at and above shoulder level.    Time 12    Period Weeks    Status On-going    Target Date 05/22/21      OT LONG TERM GOAL #2   Title Patient will increase her RUE strength to 4/5 in order to return to lifting and managing items of moderate weight at home.    Time 12    Period Weeks    Status On-going      OT LONG TERM GOAL #3   Title Patient will report a pain level of approximately 3/10 or less in her RUE when completing all self care tasks.    Time 12    Period Weeks    Status On-going      OT LONG TERM GOAL #4   Title Patient will decrease her right shoulder fascial restrictions to min  amount or less in order to increase the functional mobility needed to complete reaching tasks.    Time 12    Period Weeks    Status On-going                   Plan - 03/14/21 1526     Clinical Impression Statement A: Pt reports she is excited to take her  sling off tomorrow. Continued with myofascial release to address fascial restrictions. Passive stretching completed with pt tolerating approximately 60% ROM today. Added er, abduction, and flexion with red theraband per protocol, max difficulty with strength required for er. Verbal cuing for form and technique. Session ended slightly early due to protocol limiting additional exercises.    Body Structure / Function / Physical Skills ADL;Endurance;UE functional use;Fascial restriction;Pain;ROM;IADL;Strength    Plan P: Continue working towards improved ROM, progress to AA/ROM per protocol    OT Home Exercise Plan eval: table slides, A/ROM wrist, elbow, forearm, pendulums.    Consulted and Agree with Plan of Care Patient             Patient will benefit from skilled therapeutic intervention in order to improve the following deficits and impairments:   Body Structure / Function / Physical Skills: ADL, Endurance, UE functional use, Fascial restriction, Pain, ROM, IADL, Strength       Visit Diagnosis: Stiffness of right shoulder, not elsewhere classified  Acute pain of right shoulder  Other symptoms and signs involving the musculoskeletal system    Problem List Patient Active Problem List   Diagnosis Date Noted   Arthritis of right glenohumeral joint 02/16/2021   Impacted cerumen of both ears 12/01/2020   HTN (hypertension) 10/19/2020   HLD (hyperlipidemia) 10/19/2020   Hypothyroidism 10/19/2020   Primary osteoarthritis of right shoulder 10/19/2020   Macular pucker, left eye 08/27/2019   Macular retinoschisis, left 08/27/2019   Posterior vitreous detachment of both eyes 08/27/2019   Type 2 diabetes mellitus with other specified complication (HCC) 08/27/2019   Iron deficiency anemia 04/06/2019    Ezra Sites, OTR/L  [phone] 03/14/2021, 3:45 PM  [facility] [address] [place], [place], [address] Phone: [phone]   Fax:   [phone]  Name: Lisa Beck MRN: [id number] Date of Birth: [birth year]

## 2021-03-16 ENCOUNTER — Ambulatory Visit (HOSPITAL_COMMUNITY): Payer: Medicare Other | Admitting: Occupational Therapy

## 2021-03-16 ENCOUNTER — Other Ambulatory Visit: Payer: Self-pay

## 2021-03-16 ENCOUNTER — Encounter (HOSPITAL_COMMUNITY): Payer: Self-pay | Admitting: Occupational Therapy

## 2021-03-16 ENCOUNTER — Telehealth: Payer: Self-pay

## 2021-03-16 DIAGNOSIS — M25611 Stiffness of right shoulder, not elsewhere classified: Secondary | ICD-10-CM | POA: Diagnosis not present

## 2021-03-16 DIAGNOSIS — M25511 Pain in right shoulder: Secondary | ICD-10-CM

## 2021-03-16 DIAGNOSIS — R29898 Other symptoms and signs involving the musculoskeletal system: Secondary | ICD-10-CM

## 2021-03-16 NOTE — Therapy (Signed)
[facility] [address] [place], [place], [address] Phone: [phone]   Fax:  [phone]  Occupational Therapy Treatment  Patient Details  Name: Lisa Beck MRN: [id number] Date of Birth: 1940-06-27 Referring Provider (OT): Thane Edu, MD   Encounter Date: 03/16/2021   OT End of Session - 03/16/21 1157     Visit Number 6    Number of Visits 24    Date for OT Re-Evaluation 05/22/21   mini reassess: 03/27/21   Authorization Type 1) Medicare A & B 2) Tricare for life    Progress Note Due on Visit 10    OT Start Time 1115    OT Stop Time 1155    OT Time Calculation (min) 40 min    Activity Tolerance Patient tolerated treatment well    Behavior During Therapy [facility] for tasks assessed/performed             Past Medical History:  Diagnosis Date   Arthritis    Diabetes mellitus without complication (HCC)    History of kidney stones    HOH (hard of hearing)    Hyperlipidemia    Hypertension    Hypothyroidism     Past Surgical History:  Procedure Laterality Date   COLONOSCOPY  2007   Dr. Karilyn Cota: diverticulosis   COLONOSCOPY WITH PROPOFOL N/A 04/24/2019   Dr. Jena Gauss: Diverticulosis in the sigmoid colon, two 5 to 9 mm polyps removed from the descending and ascending colon.  Pathology revealed both to be benign polyps with no adenomatous changes. No future surveillance colonoscopies due to age.   ESOPHAGOGASTRODUODENOSCOPY (EGD) WITH PROPOFOL N/A 04/24/2019   Dr. Jena Gauss: Small hiatal hernia, one gastric polyp benign.   POLYPECTOMY  04/24/2019   Procedure: POLYPECTOMY;  Surgeon: Corbin Ade, MD;  Location[facility];  Service: Endoscopy;;  ascending;   REVERSE SHOULDER ARTHROPLASTY Right 02/16/2021   Procedure: REVERSE SHOULDER ARTHROPLASTY;  Surgeon: Oliver Barre, MD;  Location: [facility];  Service: Orthopedics;  Laterality: Right;    There were no vitals filed for this visit.   Subjective Assessment - 03/16/21 1113      Subjective  S: I'm doing pretty good.    Currently in Pain? No/denies                [facility] OT Assessment - 03/16/21 1113       Assessment   Medical Diagnosis Right reverse shoulder arthroplasty      Precautions   Precautions Shoulder    Type of Shoulder Precautions See media tab for protocol.                      OT Treatments/Exercises (OP) - 03/16/21 1116       Exercises   Exercises Shoulder      Shoulder Exercises: Supine   Protraction PROM;5 reps;AAROM;10 reps    Horizontal ABduction PROM;5 reps;AAROM;10 reps    External Rotation PROM;5 reps;AAROM;10 reps    Internal Rotation PROM;5 reps;AAROM;10 reps    Flexion PROM;5 reps;AAROM;10 reps    ABduction PROM;5 reps;AAROM;10 reps      Shoulder Exercises: Seated   External Rotation Theraband;10 reps    Theraband Level (Shoulder External Rotation) Level 2 (Red)    Flexion Theraband;10 reps    Theraband Level (Shoulder Flexion) Level 2 (Red)      Shoulder Exercises: Therapy Ball   Flexion 10 reps   5" hold at end range   ABduction 10 reps  5" hold at end range     Shoulder Exercises: ROM/Strengthening   Thumb Tacks 1' low level      Shoulder Exercises: Isometric Strengthening   Flexion Supine;3X5"    Extension Supine;3X5"    External Rotation Supine;3X5"    Internal Rotation Supine;3X5"    ABduction Supine;3X5"    ADduction Supine;3X5"      Manual Therapy   Manual Therapy Myofascial release    Manual therapy comments completed separately from therapeutic exercises    Myofascial Release myofascial release and manual techniques to right upper arm, anterior shoulder, and trapezius regions to decrease pain and fascial restrictions and increase joint ROM                      OT Short Term Goals - 03/03/21 1146       OT SHORT TERM GOAL #1   Title Pt will be provided with and educated on HEP to improve mobility required for RUE use as dominant during daily tasks.    Time 6    Period  Weeks    Status On-going    Target Date 04/10/21      OT SHORT TERM GOAL #2   Title Patient will report a pain level of approximately 5/10 when using her RUE to complete daily tasks.    Time 6    Period Weeks    Status On-going      OT SHORT TERM GOAL #3   Title Pt will decrease RUE fascial restrictions to minimal amounts or less to improve ability to perform functional reaching tasks.    Time 6    Period Weeks    Status On-going      OT SHORT TERM GOAL #4   Title Pt will increase RUE P/ROM to [facility] to improve ability to complete waist level reaching tasks and assist with dressing and bathing.    Time 6    Period Weeks    Status On-going      OT SHORT TERM GOAL #5   Title Pt will increase RUE strength to 3/5 or greater to improve ability to reach to shoulder level or below.    Time 6    Period Weeks    Status On-going               OT Long Term Goals - 03/03/21 1207       OT LONG TERM GOAL #1   Title Patient will increase her RUE A/ROM to [facility] in order to return to functional reaching tasks at and above shoulder level.    Time 12    Period Weeks    Status On-going    Target Date 05/22/21      OT LONG TERM GOAL #2   Title Patient will increase her RUE strength to 4/5 in order to return to lifting and managing items of moderate weight at home.    Time 12    Period Weeks    Status On-going      OT LONG TERM GOAL #3   Title Patient will report a pain level of approximately 3/10 or less in her RUE when completing all self care tasks.    Time 12    Period Weeks    Status On-going      OT LONG TERM GOAL #4   Title Patient will decrease her right shoulder fascial restrictions to min amount or less in order to increase the functional mobility needed to complete reaching tasks.  Time 12    Period Weeks    Status On-going                   Plan - 03/16/21 1157     Clinical Impression Statement A: Pt reports she wore her sling off and on yesterday and  did well with the time out of the sling. Continued with myofascial release to address fascial restrictions, passive stretching. Progressed to AA/ROM in supine today, continued with theraband work per protocol. Continues to have max difficulty with er during exercises. Verbal cuing for form and technique.    Body Structure / Function / Physical Skills ADL;Endurance;UE functional use;Fascial restriction;Pain;ROM;IADL;Strength    Plan P: Continue with AA/ROM and progress to standing, add to HEP    OT Home Exercise Plan eval: table slides, A/ROM wrist, elbow, forearm, pendulums.    Consulted and Agree with Plan of Care Patient             Patient will benefit from skilled therapeutic intervention in order to improve the following deficits and impairments:   Body Structure / Function / Physical Skills: ADL, Endurance, UE functional use, Fascial restriction, Pain, ROM, IADL, Strength       Visit Diagnosis: Stiffness of right shoulder, not elsewhere classified  Other symptoms and signs involving the musculoskeletal system  Acute pain of right shoulder    Problem List Patient Active Problem List   Diagnosis Date Noted   Arthritis of right glenohumeral joint 02/16/2021   Impacted cerumen of both ears 12/01/2020   HTN (hypertension) 10/19/2020   HLD (hyperlipidemia) 10/19/2020   Hypothyroidism 10/19/2020   Primary osteoarthritis of right shoulder 10/19/2020   Macular pucker, left eye 08/27/2019   Macular retinoschisis, left 08/27/2019   Posterior vitreous detachment of both eyes 08/27/2019   Type 2 diabetes mellitus with other specified complication (HCC) 08/27/2019   Iron deficiency anemia 04/06/2019    Ezra Sites, OTR/L  [phone] 03/16/2021, 11:59 AM  [facility] [address] [place], [place], [address] Phone: [phone]   Fax:  [phone]  Name: Lisa Beck MRN: [id number] Date of Birth: 10/11/1940

## 2021-03-17 NOTE — Telephone Encounter (Signed)
Called pt after speaking to Dr. Dallas Schimke, let her know that antibiotics are not necessary at this time unless dentist office recommends it. Also let pt know that provider does suggest to wait at least 3 months after surgery to decrease the possibility of concerns. Pt verbalized understanding and will push her appointment out.

## 2021-03-21 ENCOUNTER — Ambulatory Visit (HOSPITAL_COMMUNITY): Payer: Medicare Other | Attending: Orthopaedic Surgery

## 2021-03-21 ENCOUNTER — Telehealth (HOSPITAL_COMMUNITY): Payer: Self-pay

## 2021-03-21 DIAGNOSIS — M25611 Stiffness of right shoulder, not elsewhere classified: Secondary | ICD-10-CM | POA: Insufficient documentation

## 2021-03-21 DIAGNOSIS — M25511 Pain in right shoulder: Secondary | ICD-10-CM | POA: Insufficient documentation

## 2021-03-21 DIAGNOSIS — R29898 Other symptoms and signs involving the musculoskeletal system: Secondary | ICD-10-CM | POA: Insufficient documentation

## 2021-03-21 NOTE — Telephone Encounter (Signed)
Called and left message regarding missed appointment. Left message regarding next appointment and to call if unable to make it.   Limmie Patricia, OTR/L,CBIS  [phone]

## 2021-03-23 ENCOUNTER — Encounter (HOSPITAL_COMMUNITY): Payer: Medicare Other | Admitting: Occupational Therapy

## 2021-03-27 ENCOUNTER — Ambulatory Visit (HOSPITAL_COMMUNITY): Payer: Medicare Other | Admitting: Occupational Therapy

## 2021-03-27 ENCOUNTER — Encounter (HOSPITAL_COMMUNITY): Payer: Self-pay | Admitting: Occupational Therapy

## 2021-03-27 ENCOUNTER — Other Ambulatory Visit: Payer: Self-pay

## 2021-03-27 DIAGNOSIS — M25611 Stiffness of right shoulder, not elsewhere classified: Secondary | ICD-10-CM

## 2021-03-27 DIAGNOSIS — M25511 Pain in right shoulder: Secondary | ICD-10-CM | POA: Diagnosis present

## 2021-03-27 DIAGNOSIS — R29898 Other symptoms and signs involving the musculoskeletal system: Secondary | ICD-10-CM

## 2021-03-27 NOTE — Therapy (Signed)
[facility] [address] [place], [place], [address] Phone: [phone]   Fax:  [phone]  Occupational Therapy Treatment ([place])  Patient Details  Name: Lisa Beck MRN: [id number] Date of Birth: 04-25-1940 Referring Provider (OT): Larena Glassman, MD   Progress Note Reporting Period 02/27/21 to 03/27/21  See note below for Objective Data and Assessment of Progress/Goals.      Encounter Date: 03/27/2021   OT End of Session - 03/27/21 1159     Visit Number 7    Number of Visits 24    Date for OT Re-Evaluation 05/22/21   mini-reassessment 04/24/21   Authorization Type 1) Medicare A & B 2) Tricare for life    Progress Note Due on Visit 55    OT Start Time 1116    OT Stop Time 1158    OT Time Calculation (min) 42 min    Activity Tolerance Patient tolerated treatment well    Behavior During Therapy WFL for tasks assessed/performed             Past Medical History:  Diagnosis Date   Arthritis    Diabetes mellitus without complication ([place])    History of kidney stones    HOH (hard of hearing)    Hyperlipidemia    Hypertension    Hypothyroidism     Past Surgical History:  Procedure Laterality Date   COLONOSCOPY  2007   Dr. Laural Golden: diverticulosis   COLONOSCOPY WITH PROPOFOL N/A 04/24/2019   Dr. Gala Romney: Diverticulosis in the sigmoid colon, two 5 to 9 mm polyps removed from the descending and ascending colon.  Pathology revealed both to be benign polyps with no adenomatous changes. No future surveillance colonoscopies due to age.   ESOPHAGOGASTRODUODENOSCOPY (EGD) WITH PROPOFOL N/A 04/24/2019   Dr. Gala Romney: Small hiatal hernia, one gastric polyp benign.   POLYPECTOMY  04/24/2019   Procedure: POLYPECTOMY;  Surgeon: Daneil Dolin, MD;  [facility];  Service: Endoscopy;;  ascending;   REVERSE SHOULDER ARTHROPLASTY Right 02/16/2021   Procedure: REVERSE SHOULDER ARTHROPLASTY;  Surgeon: Mordecai Rasmussen, MD;   Location: [facility];  Service: Orthopedics;  Laterality: Right;    There were no vitals filed for this visit.   Subjective Assessment - 03/27/21 1116     Subjective  S: I drove myself.    Currently in Pain? No/denies                [facility] OT Assessment - 03/27/21 1116       Assessment   Medical Diagnosis Right reverse shoulder arthroplasty      Precautions   Precautions Shoulder    Type of Shoulder Precautions See media tab for protocol.      Observation/Other Assessments   Focus on Therapeutic Outcomes (FOTO)  52/100   30/100 previous     Palpation   Palpation comment Mod fascial restrictions noted in the right upper arm, upper trapezius, and scapularis region.      AROM   Overall AROM Comments Assessed seated, er/IR adducted    AROM Assessment Site Shoulder    Right/Left Shoulder Right    Right Shoulder Flexion 115 Degrees   not previously assessed   Right Shoulder ABduction 105 Degrees   not previously assessed   Right Shoulder Internal Rotation 90 Degrees   not previously assessed   Right Shoulder External Rotation 4 Degrees   not previously assessed     PROM   Overall PROM Comments Assessed supine, er/IR adducted  PROM Assessment Site Shoulder    Right/Left Shoulder Right    Right Shoulder Flexion 135 Degrees   99 previous   Right Shoulder ABduction 130 Degrees   94 previous   Right Shoulder Internal Rotation 90 Degrees   same as previous   Right Shoulder External Rotation 24 Degrees   9 previous     Strength   Overall Strength Comments Assessed seated, er/IR adducted    Strength Assessment Site Shoulder    Right Shoulder Flexion 3-/5   not previously assessed   Right Shoulder ABduction 3-/5   not previously assessed   Right Shoulder Internal Rotation 3/5   not previously assessed   Right Shoulder External Rotation 2+/5   not previously assessed                     OT Treatments/Exercises (OP) - 03/27/21 1118       Exercises   Exercises  Shoulder      Shoulder Exercises: Supine   Protraction PROM;5 reps;AAROM;10 reps    Horizontal ABduction PROM;5 reps;AAROM;10 reps    External Rotation PROM;5 reps;AAROM;10 reps    Internal Rotation PROM;5 reps;AAROM;10 reps    Flexion PROM;5 reps;AAROM;10 reps    ABduction PROM;5 reps;AAROM;10 reps      Shoulder Exercises: Standing   Protraction AAROM;10 reps    Horizontal ABduction AAROM;10 reps    External Rotation AAROM;10 reps    Internal Rotation AAROM;10 reps    Flexion AAROM;10 reps    ABduction AAROM;10 reps      Shoulder Exercises: ROM/Strengthening   Wall Wash 1'    Other ROM/Strengthening Exercises pvc pipe slide: 10X flexion      Manual Therapy   Manual Therapy Myofascial release    Manual therapy comments completed separately from therapeutic exercises    Myofascial Release myofascial release and manual techniques to right upper arm, anterior shoulder, and trapezius regions to decrease pain and fascial restrictions and increase joint ROM                    OT Education - 03/27/21 1150     Education Details AA/ROM exercises    Person(s) Educated Patient    Methods Explanation;Demonstration;Handout    Comprehension Verbalized understanding;Returned demonstration              OT Short Term Goals - 03/27/21 1147       OT SHORT TERM GOAL #1   Title Pt will be provided with and educated on HEP to improve mobility required for RUE use as dominant during daily tasks.    Time 6    Period Weeks    Status On-going    Target Date 04/10/21      OT SHORT TERM GOAL #2   Title Patient will report a pain level of approximately 5/10 when using her RUE to complete daily tasks.    Time 6    Period Weeks    Status Achieved      OT SHORT TERM GOAL #3   Title Pt will decrease RUE fascial restrictions to minimal amounts or less to improve ability to perform functional reaching tasks.    Time 6    Period Weeks    Status On-going      OT SHORT TERM GOAL  #4   Title Pt will increase RUE P/ROM to [facility] to improve ability to complete waist level reaching tasks and assist with dressing and bathing.    Time 6  Period Weeks    Status On-going      OT SHORT TERM GOAL #5   Title Pt will increase RUE strength to 3/5 or greater to improve ability to reach to shoulder level or below.    Time 6    Period Weeks    Status On-going               OT Long Term Goals - 03/03/21 1207       OT LONG TERM GOAL #1   Title Patient will increase her RUE A/ROM to [facility] in order to return to functional reaching tasks at and above shoulder level.    Time 12    Period Weeks    Status On-going    Target Date 05/22/21      OT LONG TERM GOAL #2   Title Patient will increase her RUE strength to 4/5 in order to return to lifting and managing items of moderate weight at home.    Time 12    Period Weeks    Status On-going      OT LONG TERM GOAL #3   Title Patient will report a pain level of approximately 3/10 or less in her RUE when completing all self care tasks.    Time 12    Period Weeks    Status On-going      OT LONG TERM GOAL #4   Title Patient will decrease her right shoulder fascial restrictions to min amount or less in order to increase the functional mobility needed to complete reaching tasks.    Time 12    Period Weeks    Status On-going                   Plan - 03/27/21 1148     Clinical Impression Statement A: Pt reports she is using her RUE for more ADLs now, continues to have difficulty with reaching for items overhead. Mini-reassessment completed this session, pt is making progress with both ROM and strength and has met 1 STG thus far during therapy. Continued with AA/ROM today and progressed to standing. Added PVC pipe slide today, pt achieving good ROM with both PVC pipe and wall wash today. Verbal cuing for form and technique.    Body Structure / Function / Physical Skills ADL;Endurance;UE functional use;Fascial  restriction;Pain;ROM;IADL;Strength    Plan P: Follow up on HEP, continue with AA/ROM    OT Home Exercise Plan eval: table slides, A/ROM wrist, elbow, forearm, pendulums; 1/9: AA/ROM exercises    Consulted and Agree with Plan of Care Patient             Patient will benefit from skilled therapeutic intervention in order to improve the following deficits and impairments:   Body Structure / Function / Physical Skills: ADL, Endurance, UE functional use, Fascial restriction, Pain, ROM, IADL, Strength       Visit Diagnosis: Stiffness of right shoulder, not elsewhere classified  Other symptoms and signs involving the musculoskeletal system  Acute pain of right shoulder    Problem List Patient Active Problem List   Diagnosis Date Noted   Arthritis of right glenohumeral joint 02/16/2021   Impacted cerumen of both ears 12/01/2020   HTN (hypertension) 10/19/2020   HLD (hyperlipidemia) 10/19/2020   Hypothyroidism 10/19/2020   Primary osteoarthritis of right shoulder 10/19/2020   Macular pucker, left eye 08/27/2019   Macular retinoschisis, left 08/27/2019   Posterior vitreous detachment of both eyes 08/27/2019   Type 2 diabetes mellitus with  other specified complication ([place]) 67/28/9791   Iron deficiency anemia 04/06/2019    Guadelupe Sabin, OTR/L  [phone] 03/27/2021, 12:02 PM  [facility] [address] [place], [place], [address] Phone: [phone]   Fax:  [phone]  Name: Lisa Beck MRN: [id number] Date of Birth: 03-Apr-1940

## 2021-03-27 NOTE — Patient Instructions (Signed)

## 2021-03-29 ENCOUNTER — Encounter (HOSPITAL_COMMUNITY): Payer: Medicare Other

## 2021-03-31 ENCOUNTER — Ambulatory Visit (INDEPENDENT_AMBULATORY_CARE_PROVIDER_SITE_OTHER): Payer: Medicare Other | Admitting: Orthopedic Surgery

## 2021-03-31 ENCOUNTER — Encounter: Payer: Self-pay | Admitting: Orthopedic Surgery

## 2021-03-31 ENCOUNTER — Encounter (HOSPITAL_COMMUNITY): Payer: Self-pay

## 2021-03-31 ENCOUNTER — Ambulatory Visit (HOSPITAL_COMMUNITY): Payer: Medicare Other

## 2021-03-31 ENCOUNTER — Other Ambulatory Visit: Payer: Self-pay

## 2021-03-31 ENCOUNTER — Ambulatory Visit: Payer: Medicare Other

## 2021-03-31 DIAGNOSIS — M25511 Pain in right shoulder: Secondary | ICD-10-CM

## 2021-03-31 DIAGNOSIS — Z96611 Presence of right artificial shoulder joint: Secondary | ICD-10-CM

## 2021-03-31 DIAGNOSIS — G8929 Other chronic pain: Secondary | ICD-10-CM

## 2021-03-31 DIAGNOSIS — R29898 Other symptoms and signs involving the musculoskeletal system: Secondary | ICD-10-CM

## 2021-03-31 DIAGNOSIS — M25611 Stiffness of right shoulder, not elsewhere classified: Secondary | ICD-10-CM | POA: Diagnosis not present

## 2021-03-31 NOTE — Patient Instructions (Signed)
° °  Focus on right elbow relaxed by your side. Complete throughout the day.

## 2021-03-31 NOTE — Progress Notes (Signed)
Orthopaedic Postop Note  Assessment: Lisa Beck is a 81 y.o. female s/p Right Reverse Shoulder Arthroplasty  DOS: 02/16/2021  Plan: Radiographs reviewed, stable appearance. Pain is improving.  Occasional medications. She is progressing with physical therapy.  Encouraged her to continue working on her range of motion. No concerns at this time.  Follow-up in 6 weeks  Follow-up: Return in about 6 weeks (around 05/12/2021).  XR at next visit: Right shoulder  Subjective:  Chief Complaint  Patient presents with   Routine Post Op    DOS 02/16/21 S/p reverse arthroplasty RT shoulder    History of Present Illness: Lisa Beck is a 81 y.o. female who presents following the above stated procedure.  Surgery was 6 weeks ago.  She continues to improve.  She is pleased with her progress to date.  She is working hard with physical therapy.  She is not taking medications on a regular basis.  She denies numbness and tingling.   Review of Systems: No fevers or chills No numbness or tingling No Chest Pain No shortness of breath   Objective: There were no vitals taken for this visit.  Physical Exam:  Alert and oriented.  No acute distress.  Evaluation of right shoulder demonstrates a healing anterior based shoulder incision.  No surrounding erythema or drainage.  Sensation is intact in the axillary nerve distribution.  Sensation is intact throughout the right hand.  She tolerates forward flexion to 130 degrees.  Fingers warm and well-perfused.  2+ radial pulse.  IMAGING: I personally ordered and reviewed the following images:  XR of the Right shoulder obtained in clinic today and demonstrates shoulder arthroplasty with implants in good position.  No evidence of acute injury or subsidence of implants.  No change compared to prior x-rays.  Impression: Right shoulder arthroplasty in stable position   Mordecai Rasmussen, MD 03/31/2021 11:40 PM

## 2021-03-31 NOTE — Therapy (Signed)
[place] [facility] [address] [place], [place], [address] Phone: [phone]   Fax:  [phone]  Occupational Therapy Treatment  Patient Details  Name: Unk PintoCarolyn K Irion MRN: [id number] Date of Birth: 08/10/1940 Referring Provider (OT): Thane EduMark Cairns, MD   Encounter Date: 03/31/2021   OT End of Session - 03/31/21 1209     Visit Number 8    Number of Visits 24    Date for OT Re-Evaluation 05/22/21   mini-reassessment 04/24/21   Authorization Type 1) Medicare A & B 2) Tricare for life    Progress Note Due on Visit 17    OT Start Time 1030    OT Stop Time 1108    OT Time Calculation (min) 38 min    Activity Tolerance Patient tolerated treatment well    Behavior During Therapy WFL for tasks assessed/performed             Past Medical History:  Diagnosis Date   Arthritis    Diabetes mellitus without complication (HCC)    History of kidney stones    HOH (hard of hearing)    Hyperlipidemia    Hypertension    Hypothyroidism     Past Surgical History:  Procedure Laterality Date   COLONOSCOPY  2007   Dr. Karilyn Cotaehman: diverticulosis   COLONOSCOPY WITH PROPOFOL N/A 04/24/2019   Dr. Jena Gaussourk: Diverticulosis in the sigmoid colon, two 5 to 9 mm polyps removed from the descending and ascending colon.  Pathology revealed both to be benign polyps with no adenomatous changes. No future surveillance colonoscopies due to age.   ESOPHAGOGASTRODUODENOSCOPY (EGD) WITH PROPOFOL N/A 04/24/2019   Dr. Jena Gaussourk: Small hiatal hernia, one gastric polyp benign.   POLYPECTOMY  04/24/2019   Procedure: POLYPECTOMY;  Surgeon: Corbin Adeourk, Robert M, MD;  [facility];  Service: Endoscopy;;  ascending;   REVERSE SHOULDER ARTHROPLASTY Right 02/16/2021   Procedure: REVERSE SHOULDER ARTHROPLASTY;  Surgeon: Oliver Barreairns, Mark A, MD;  Location: [facility];  Service: Orthopedics;  Laterality: Right;    There were no vitals filed for this visit.   Subjective Assessment - 03/31/21 1035      Subjective  S: I see the Doctor right after this.    Currently in Pain? No/denies                [facility] - 03/31/21 1209       Assessment   Medical Diagnosis Right reverse shoulder arthroplasty      Precautions   Precautions Shoulder    Type of Shoulder Precautions See media tab for protocol.                      OT Treatments/Exercises (OP) - 03/31/21 1036       Exercises   Exercises Shoulder      Shoulder Exercises: Supine   Protraction PROM;5 reps;AAROM;12 reps    Horizontal ABduction PROM;5 reps;AAROM;12 reps    External Rotation PROM;5 reps;AAROM;12 reps    Internal Rotation PROM;5 reps;AAROM;12 reps    Flexion PROM;5 reps;AAROM;12 reps    ABduction PROM;5 reps;AAROM;12 reps      Shoulder Exercises: Standing   Protraction AAROM;12 reps    Horizontal ABduction AAROM;10 reps    External Rotation AAROM;10 reps    Internal Rotation AAROM;10 reps    Flexion AAROM;10 reps    ABduction AAROM;10 reps      Shoulder Exercises: ROM/Strengthening   Wall Wash Attempted. stopped during poor form and technique.  Proximal Shoulder Strengthening, Supine 10X A/ROM no rest breaks    Other ROM/Strengthening Exercises attempted. stopped due to form.    Other ROM/Strengthening Exercises To provide a visual and tactile cue, therapist provided stability to right UE and completed partial shoulder flexion. OT faciliated shoulder to remain in adduction through movement with physical cueing at elbow. Then placed Bilateral palms against wall with a push up position (elbows relaxed and bent). Held position for a short time period while providing education.      Shoulder Exercises: Isometric Strengthening   Flexion Other (comment)   standing. hand to palm, 2x10"   ADduction Other (comment)   standing, 2x10"                   OT Education - 03/31/21 1200     Education Details Provided print out of visual (shoulder adducted/elbow by the side , palms  on wall)Patient instructed to work on achieving this position throughout the day to decrease shoulder abduction when attempting to complete functional reaching.    Person(s) Educated Patient    Methods Explanation;Handout;Demonstration;Verbal cues;Tactile cues    Comprehension Verbalized understanding;Returned demonstration              OT Short Term Goals - 03/31/21 1214       OT SHORT TERM GOAL #1   Title Pt will be provided with and educated on HEP to improve mobility required for RUE use as dominant during daily tasks.    Time 6    Period Weeks    Status On-going    Target Date 04/10/21      OT SHORT TERM GOAL #2   Title Patient will report a pain level of approximately 5/10 when using her RUE to complete daily tasks.    Time 6    Period Weeks      OT SHORT TERM GOAL #3   Title Pt will decrease RUE fascial restrictions to minimal amounts or less to improve ability to perform functional reaching tasks.    Time 6    Period Weeks    Status On-going      OT SHORT TERM GOAL #4   Title Pt will increase RUE P/ROM to [facility] to improve ability to complete waist level reaching tasks and assist with dressing and bathing.    Time 6    Period Weeks    Status On-going      OT SHORT TERM GOAL #5   Title Pt will increase RUE strength to 3/5 or greater to improve ability to reach to shoulder level or below.    Time 6    Period Weeks    Status On-going               OT Long Term Goals - 03/03/21 1207       OT LONG TERM GOAL #1   Title Patient will increase her RUE A/ROM to [facility] in order to return to functional reaching tasks at and above shoulder level.    Time 12    Period Weeks    Status On-going    Target Date 05/22/21      OT LONG TERM GOAL #2   Title Patient will increase her RUE strength to 4/5 in order to return to lifting and managing items of moderate weight at home.    Time 12    Period Weeks    Status On-going      OT LONG TERM GOAL #3   Title Patient will  report a pain level of approximately 3/10 or less in her RUE when completing all self care tasks.    Time 12    Period Weeks    Status On-going      OT LONG TERM GOAL #4   Title Patient will decrease her right shoulder fascial restrictions to min amount or less in order to increase the functional mobility needed to complete reaching tasks.    Time 12    Period Weeks    Status On-going                   Plan - 03/31/21 1210     Clinical Impression Statement A: Patient with increased difficulty demonstrating proper form and technique during functional reaching/shoulder flexion. Consistantly attempted to complete shoulder abduction with elbow sticking out to the side when attempting wall wash. Initially unable to complete wall wash; even a low level due to shoulder attempting to abduct and unable to place palm flat on door. Focused on decreasing this compensatory body movements for the remainder of session with patient demonstrating improvement by the end of session.    Body Structure / Function / Physical Skills ADL;Endurance;UE functional use;Fascial restriction;Pain;ROM;IADL;Strength    Plan P: work on proper form and technique during functional reaching. decrease shoulder abduction when reaching.    OT Home Exercise Plan eval: table slides, A/ROM wrist, elbow, forearm, pendulums; 1/9: AA/ROM exercises 1/13: Using wall to work on decreasing shoulder abduction    Consulted and Agree with Plan of Care Patient             Patient will benefit from skilled therapeutic intervention in order to improve the following deficits and impairments:   Body Structure / Function / Physical Skills: ADL, Endurance, UE functional use, Fascial restriction, Pain, ROM, IADL, Strength       Visit Diagnosis: Stiffness of right shoulder, not elsewhere classified  Acute pain of right shoulder  Other symptoms and signs involving the musculoskeletal system    Problem List Patient Active  Problem List   Diagnosis Date Noted   Arthritis of right glenohumeral joint 02/16/2021   Impacted cerumen of both ears 12/01/2020   HTN (hypertension) 10/19/2020   HLD (hyperlipidemia) 10/19/2020   Hypothyroidism 10/19/2020   Primary osteoarthritis of right shoulder 10/19/2020   Macular pucker, left eye 08/27/2019   Macular retinoschisis, left 08/27/2019   Posterior vitreous detachment of both eyes 08/27/2019   Type 2 diabetes mellitus with other specified complication (HCC) 08/27/2019   Iron deficiency anemia 04/06/2019    Limmie Patricia, OTR/L,CBIS  [phone]  03/31/2021, 12:15 PM  [facility] [facility] [address] [place], [place], [address] Phone: [phone]   Fax:  [phone]  Name: SHAKIARA LUKIC MRN: [id number] Date of Birth: 05-17-1940

## 2021-04-04 ENCOUNTER — Ambulatory Visit (HOSPITAL_COMMUNITY): Payer: Medicare Other

## 2021-04-04 ENCOUNTER — Other Ambulatory Visit: Payer: Self-pay

## 2021-04-04 ENCOUNTER — Encounter (HOSPITAL_COMMUNITY): Payer: Self-pay

## 2021-04-04 DIAGNOSIS — M25511 Pain in right shoulder: Secondary | ICD-10-CM

## 2021-04-04 DIAGNOSIS — M25611 Stiffness of right shoulder, not elsewhere classified: Secondary | ICD-10-CM

## 2021-04-04 DIAGNOSIS — R29898 Other symptoms and signs involving the musculoskeletal system: Secondary | ICD-10-CM

## 2021-04-04 NOTE — Therapy (Signed)
[place] [facility] [address] [place], [place], [address] Phone: [phone]   Fax:  [phone]  Occupational Therapy Treatment  Patient Details  Name: Lisa Beck MRN: [id number] Date of Birth: [birth year] Referring Provider (OT): Thane Edu, MD   Encounter Date: 04/04/2021   OT End of Session - 04/04/21 1208     Visit Number 9    Number of Visits 24    Date for OT Re-Evaluation 05/22/21   mini-reassessment 04/24/21   Authorization Type 1) Medicare A & B 2) Tricare for life    Progress Note Due on Visit 17    OT Start Time 1115    OT Stop Time 1153    OT Time Calculation (min) 38 min    Activity Tolerance Patient tolerated treatment well    Behavior During Therapy [facility] for tasks assessed/performed             Past Medical History:  Diagnosis Date   Arthritis    Diabetes mellitus without complication (HCC)    History of kidney stones    HOH (hard of hearing)    Hyperlipidemia    Hypertension    Hypothyroidism     Past Surgical History:  Procedure Laterality Date   COLONOSCOPY  2007   Dr. Karilyn Cota: diverticulosis   COLONOSCOPY WITH PROPOFOL N/A 04/24/2019   Dr. Jena Gauss: Diverticulosis in the sigmoid colon, two 5 to 9 mm polyps removed from the descending and ascending colon.  Pathology revealed both to be benign polyps with no adenomatous changes. No future surveillance colonoscopies due to age.   ESOPHAGOGASTRODUODENOSCOPY (EGD) WITH PROPOFOL N/A 04/24/2019   Dr. Jena Gauss: Small hiatal hernia, one gastric polyp benign.   POLYPECTOMY  04/24/2019   Procedure: POLYPECTOMY;  Surgeon: Corbin Ade, MD;  [facility];  Service: Endoscopy;;  ascending;   REVERSE SHOULDER ARTHROPLASTY Right 02/16/2021   Procedure: REVERSE SHOULDER ARTHROPLASTY;  Surgeon: Oliver Barre, MD;  Location: [facility];  Service: Orthopedics;  Laterality: Right;    There were no vitals filed for this visit.   Subjective Assessment - 04/04/21 1134      Subjective  S: The Doctor was pleased.    Currently in Pain? No/denies                [facility] OT Assessment - 04/04/21 1134       Assessment   Medical Diagnosis Right reverse shoulder arthroplasty    Next MD Visit 05/12/21      Precautions   Precautions Shoulder    Type of Shoulder Precautions See media tab for protocol.                      OT Treatments/Exercises (OP) - 04/04/21 1136       Exercises   Exercises Shoulder      Shoulder Exercises: Supine   Protraction PROM;5 reps    Horizontal ABduction PROM;5 reps    External Rotation PROM;5 reps    Internal Rotation PROM;5 reps    Flexion PROM;5 reps    ABduction PROM;5 reps      Shoulder Exercises: Seated   Protraction AAROM;10 reps    External Rotation AAROM;10 reps    Internal Rotation AAROM;10 reps    Flexion AAROM;10 reps   to shoulder level     Shoulder Exercises: Pulleys   Flexion Other (comment)   10X slow and controlled. Focusing on form. standing   ABduction Other (comment)   10X  slow and controlled. Focusing on form. standing     Shoulder Exercises: ROM/Strengthening   UBE (Upper Arm Bike) Level 1 3' reverse only   pace: 2.0   Wall Wash Assisted wall wash. Focused on form/shoulder adducted. Flexion, circles both directions, horizontal adduction/abduction approximately 1 minute each.    Other ROM/Strengthening Exercises PVC pipe slide; 10X; flexion                      OT Short Term Goals - 03/31/21 1214       OT SHORT TERM GOAL #1   Title Pt will be provided with and educated on HEP to improve mobility required for RUE use as dominant during daily tasks.    Time 6    Period Weeks    Status On-going    Target Date 04/10/21      OT SHORT TERM GOAL #2   Title Patient will report a pain level of approximately 5/10 when using her RUE to complete daily tasks.    Time 6    Period Weeks      OT SHORT TERM GOAL #3   Title Pt will decrease RUE fascial restrictions to minimal  amounts or less to improve ability to perform functional reaching tasks.    Time 6    Period Weeks    Status On-going      OT SHORT TERM GOAL #4   Title Pt will [facility] to [facility] to improve ability to complete waist level reaching tasks and assist with dressing and bathing.    Time 6    Period Weeks    Status On-going      OT SHORT TERM GOAL #5   Title Pt will increase RUE strength to 3/5 or greater to improve ability to reach to shoulder level or below.    Time 6    Period Weeks    Status On-going               OT Long Term Goals - 03/03/21 1207       OT LONG TERM GOAL #1   Title Patient will increase her RUE [facility] to [facility] in [facility] to return to functional reaching tasks at and above shoulder level.    Time 12    Period Weeks    Status On-going    Target Date 05/22/21      OT LONG TERM GOAL #2   Title Patient will increase her RUE strength to 4/5 in order to return to lifting and managing items of moderate weight at home.    Time 12    Period Weeks    Status On-going      OT LONG TERM GOAL #3   Title Patient will report a pain level of approximately 3/10 or less in her RUE when completing all self care tasks.    Time 12    Period Weeks    Status On-going      OT LONG TERM GOAL #4   Title Patient will decrease her right shoulder fascial restrictions to min amount or less in order to increase the functional mobility needed to complete reaching tasks.    Time 12    Period Weeks    Status On-going                   Plan - 04/04/21 1208     Clinical Impression Statement A: Patient demonstrated progress with decreasing shoulder abduction while keeping her elbow closert  to her body when completing functional reaching. Unable to complete wall wash independently with good form and OT provided active assist. Focused on slow and controlled movements during session which increased form and technique. verbal and physical cueing for form and technique  provided.    Body Structure / Function / Physical Skills ADL;Endurance;UE functional use;Fascial restriction;Pain;ROM;IADL;Strength    Plan P: Continue to work on decreasing shoulder abduction when reaching. Add scapular strengthening for row and extension.    Consulted and Agree with Plan of Care Patient             Patient will benefit from skilled therapeutic intervention in order to improve the following deficits and impairments:   Body Structure / Function / Physical Skills: ADL, Endurance, UE functional use, Fascial restriction, Pain, ROM, IADL, Strength       Visit Diagnosis: Stiffness of right shoulder, not elsewhere classified  Acute pain of right shoulder  Other symptoms and signs involving the musculoskeletal system    Problem List Patient Active Problem List   Diagnosis Date Noted   Arthritis of right glenohumeral joint 02/16/2021   Impacted cerumen of both ears 12/01/2020   HTN (hypertension) 10/19/2020   HLD (hyperlipidemia) 10/19/2020   Hypothyroidism 10/19/2020   Primary osteoarthritis of right shoulder 10/19/2020   Macular pucker, left eye 08/27/2019   Macular retinoschisis, left 08/27/2019   Posterior vitreous detachment of both eyes 08/27/2019   Type 2 diabetes mellitus with other specified complication (HCC) 08/27/2019   Iron deficiency anemia 04/06/2019    Limmie Patricia, OTR/L,CBIS  [phone]  04/04/2021, 12:10 PM  [place] [facility] [address] [place], [place], [address] Phone: [phone]   Fax:  [phone]  Name: Lisa Beck MRN: [id number] Date of Birth: 04/30/1940

## 2021-04-07 ENCOUNTER — Encounter (HOSPITAL_COMMUNITY): Payer: Self-pay

## 2021-04-07 ENCOUNTER — Other Ambulatory Visit: Payer: Self-pay

## 2021-04-07 ENCOUNTER — Ambulatory Visit (HOSPITAL_COMMUNITY): Payer: Medicare Other

## 2021-04-07 DIAGNOSIS — M25611 Stiffness of right shoulder, not elsewhere classified: Secondary | ICD-10-CM

## 2021-04-07 DIAGNOSIS — R29898 Other symptoms and signs involving the musculoskeletal system: Secondary | ICD-10-CM

## 2021-04-07 DIAGNOSIS — M25511 Pain in right shoulder: Secondary | ICD-10-CM

## 2021-04-07 NOTE — Therapy (Signed)
[facility] [address] [place], [place], [address] Phone: [phone]   Fax:  [phone]  Occupational Therapy Treatment  Patient Details  Name: Lisa Beck MRN: [id number] Date of Birth: 12-20-1940 Referring Provider (OT): Thane Edu, MD   Encounter Date: 04/07/2021   OT End of Session - 04/07/21 1114     Visit Number 10    Number of Visits 24    Date for OT Re-Evaluation 05/22/21   mini-reassessment 04/24/21   Authorization Type 1) Medicare A & B 2) Tricare for life    Progress Note Due on Visit 17    OT Start Time 1030    OT Stop Time 1108    OT Time Calculation (min) 38 min    Activity Tolerance Patient tolerated treatment well    Behavior During Therapy WFL for tasks assessed/performed             Past Medical History:  Diagnosis Date   Arthritis    Diabetes mellitus without complication (HCC)    History of kidney stones    HOH (hard of hearing)    Hyperlipidemia    Hypertension    Hypothyroidism     Past Surgical History:  Procedure Laterality Date   COLONOSCOPY  2007   Dr. Karilyn Cota: diverticulosis   COLONOSCOPY WITH PROPOFOL N/A 04/24/2019   Dr. Jena Gauss: Diverticulosis in the sigmoid colon, two 5 to 9 mm polyps removed from the descending and ascending colon.  Pathology revealed both to be benign polyps with no adenomatous changes. No future surveillance colonoscopies due to age.   ESOPHAGOGASTRODUODENOSCOPY (EGD) WITH PROPOFOL N/A 04/24/2019   Dr. Jena Gauss: Small hiatal hernia, one gastric polyp benign.   POLYPECTOMY  04/24/2019   Procedure: POLYPECTOMY;  Surgeon: Corbin Ade, MD;  [facility];  Service: Endoscopy;;  ascending;   REVERSE SHOULDER ARTHROPLASTY Right 02/16/2021   Procedure: REVERSE SHOULDER ARTHROPLASTY;  Surgeon: Oliver Barre, MD;  Location: [facility];  Service: Orthopedics;  Laterality: Right;    There were no vitals filed for this visit.   Subjective Assessment - 04/07/21 1047      Subjective  S: It's doing good (shoulder). I don't have any pain.    Currently in Pain? No/denies                [facility] OT Assessment - 04/07/21 1051       Assessment   Medical Diagnosis Right reverse shoulder arthroplasty      Precautions   Precautions Shoulder    Type of Shoulder Precautions See media tab for protocol.                      OT Treatments/Exercises (OP) - 04/07/21 1048       Exercises   Exercises Shoulder      Shoulder Exercises: Supine   Protraction PROM;5 reps    Horizontal ABduction PROM;5 reps    External Rotation PROM;5 reps    Internal Rotation PROM;5 reps    Flexion PROM;5 reps    ABduction PROM;5 reps      Shoulder Exercises: Seated   Protraction AROM;10 reps    Horizontal ABduction AROM;10 reps    External Rotation AAROM;10 reps    Internal Rotation AAROM;10 reps    Flexion AROM;10 reps   slightly higher  than shoulder     Shoulder Exercises: Standing   Extension Theraband;10 reps    Theraband Level (Shoulder Extension) Level 2 (Red)  Row Theraband;10 reps    Theraband Level (Shoulder Row) Level 2 (Red)      Shoulder Exercises: ROM/Strengthening   UBE (Upper Arm Bike) Level 1 3' reverse only    Wall Wash Assisted wall wash focused on flexion with shoulder adducted/elbow in. 1'      Functional Reaching Activities   Mid Level 10 cones placed on 1st shelf using right arm while focusing on decreasing the amount of shoulder abduction. removed each cone from shelf individually.                      OT Short Term Goals - 03/31/21 1214       OT SHORT TERM GOAL #1   Title Pt will be provided with and educated on HEP to improve mobility required for RUE use as dominant during daily tasks.    Time 6    Period Weeks    Status On-going    Target Date 04/10/21      OT SHORT TERM GOAL #2   Title Patient will report a pain level of approximately 5/10 when using her RUE to complete daily tasks.    Time 6     Period Weeks      OT SHORT TERM GOAL #3   Title Pt will decrease RUE fascial restrictions to minimal amounts or less to improve ability to perform functional reaching tasks.    Time 6    Period Weeks    Status On-going      OT SHORT TERM GOAL #4   Title Pt will increase RUE [facility] to improve ability to complete waist level reaching tasks and assist with dressing and bathing.    Time 6    Period Weeks    Status On-going      OT SHORT TERM GOAL #5   Title Pt will increase RUE strength to 3/5 or greater to improve ability to reach to shoulder level or below.    Time 6    Period Weeks    Status On-going               OT Long Term Goals - 03/03/21 1207       OT LONG TERM GOAL #1   Title Patient will increase her RUE [facility]ROM to [facility] in order to return to functional reaching tasks at and above shoulder level.    Time 12    Period Weeks    Status On-going    Target Date 05/22/21      OT LONG TERM GOAL #2   Title Patient will increase her RUE strength to 4/5 in order to return to lifting and managing items of moderate weight at home.    Time 12    Period Weeks    Status On-going      OT LONG TERM GOAL #3   Title Patient will report a pain level of approximately 3/10 or less in her RUE when completing all self care tasks.    Time 12    Period Weeks    Status On-going      OT LONG TERM GOAL #4   Title Patient will decrease her right shoulder fascial restrictions to min amount or less in order to increase the functional mobility needed to complete reaching tasks.    Time 12    Period Weeks    Status On-going                   Plan -  04/07/21 1114     Clinical Impression Statement A: Continued to focus on decreasing shoulder abduction while keeping elbow closer to the body. Mid level reaching completed with light physical assist initially for form and technique. Provided VC for form and technique overall during session. Added scapular strengthening with red  band.    Body Structure / Function / Physical Skills ADL;Endurance;UE functional use;Fascial restriction;Pain;ROM;IADL;Strength    Plan P: Continue to work on decreasing shoulder abduction when reaching. strengtheing for anterior deltoids, infraspinatus             Patient will benefit from skilled therapeutic intervention in order to improve the following deficits and impairments:   Body Structure / Function / Physical Skills: ADL, Endurance, UE functional use, Fascial restriction, Pain, ROM, IADL, Strength       Visit Diagnosis: Stiffness of right shoulder, not elsewhere classified  Acute pain of right shoulder  Other symptoms and signs involving the musculoskeletal system    Problem List Patient Active Problem List   Diagnosis Date Noted   Arthritis of right glenohumeral joint 02/16/2021   Impacted cerumen of both ears 12/01/2020   HTN (hypertension) 10/19/2020   HLD (hyperlipidemia) 10/19/2020   Hypothyroidism 10/19/2020   Primary osteoarthritis of right shoulder 10/19/2020   Macular pucker, left eye 08/27/2019   Macular retinoschisis, left 08/27/2019   Posterior vitreous detachment of both eyes 08/27/2019   Type 2 diabetes mellitus with other specified complication (HCC) 08/27/2019   Iron deficiency anemia 04/06/2019    Limmie Patricia, OTR/L,CBIS  [phone]  04/07/2021, 11:58 AM  [facility] [address] [place], [place], [address] Phone: [phone]   Fax:  [phone]  Name: KIEREN RICCI MRN: [id number] Date of Birth: 17-May-1940

## 2021-04-11 ENCOUNTER — Ambulatory Visit (HOSPITAL_COMMUNITY): Payer: Medicare Other

## 2021-04-11 ENCOUNTER — Other Ambulatory Visit: Payer: Self-pay

## 2021-04-11 ENCOUNTER — Encounter (HOSPITAL_COMMUNITY): Payer: Self-pay

## 2021-04-11 DIAGNOSIS — M25611 Stiffness of right shoulder, not elsewhere classified: Secondary | ICD-10-CM

## 2021-04-11 DIAGNOSIS — R29898 Other symptoms and signs involving the musculoskeletal system: Secondary | ICD-10-CM

## 2021-04-11 DIAGNOSIS — M25511 Pain in right shoulder: Secondary | ICD-10-CM

## 2021-04-11 NOTE — Therapy (Signed)
[place], [place], [address] Phone: [phone]   Fax:  [phone]  Occupational Therapy Treatment  Patient Details  Name: IISHA SILVERNALE MRN: [id number] Date of Birth: 05-12-1940 Referring Provider (OT): Larena Glassman, MD   Encounter Date: 04/11/2021   OT End of Session - 04/11/21 1503     Visit Number 11    Number of Visits 24    Date for OT Re-Evaluation 05/22/21   mini-reassessment 04/24/21   Authorization Type 1) Medicare A & B 2) Tricare for life    Progress Note Due on Visit 80    OT Start Time 1345    OT Stop Time 1423    OT Time Calculation (min) 38 min    Activity Tolerance Patient tolerated treatment well    Behavior During Therapy [facility] for tasks assessed/performed             Past Medical History:  Diagnosis Date   Arthritis    Diabetes mellitus without complication ([place])    History of kidney stones    HOH (hard of hearing)    Hyperlipidemia    Hypertension    Hypothyroidism     Past Surgical History:  Procedure Laterality Date   COLONOSCOPY  2007   Dr. Laural Golden: diverticulosis   COLONOSCOPY WITH PROPOFOL N/A 04/24/2019   Dr. Gala Romney: Diverticulosis in the sigmoid colon, two 5 to 9 mm polyps removed from the descending and ascending colon.  Pathology revealed both to be benign polyps with no adenomatous changes. No future surveillance colonoscopies due to age.   ESOPHAGOGASTRODUODENOSCOPY (EGD) WITH PROPOFOL N/A 04/24/2019   Dr. Gala Romney: Small hiatal hernia, one gastric polyp benign.   POLYPECTOMY  04/24/2019   Procedure: POLYPECTOMY;  Surgeon: Daneil Dolin, MD;  [facility];  Service: Endoscopy;;  ascending;   REVERSE SHOULDER ARTHROPLASTY Right 02/16/2021   Procedure: REVERSE SHOULDER ARTHROPLASTY;  Surgeon: Mordecai Rasmussen, MD;  Location: [facility];  Service: Orthopedics;  Laterality: Right;    There were no vitals filed for this visit.   Subjective Assessment - 04/11/21 1459      Subjective  S: It's just a little sore in this spot but nothing bad. Just a little bit.    Currently in Pain? Yes    Pain Score 3     Pain Location Shoulder    Pain Orientation Right    Pain Descriptors / Indicators Sore    Pain Type Acute pain    Pain Onset Today    Pain Frequency Constant    Aggravating Factors  unknown    Pain Relieving Factors nothing needed    Effect of Pain on Daily Activities no effect    Multiple Pain Sites No                [facility] OT Assessment - 04/11/21 1503       Assessment   Medical Diagnosis Right reverse shoulder arthroplasty      Precautions   Precautions Shoulder    Type of Shoulder Precautions See media tab for protocol.                      OT Treatments/Exercises (OP) - 04/11/21 1410       Exercises   Exercises Shoulder      Shoulder Exercises: Supine   Protraction PROM;5 reps    Horizontal ABduction PROM;5 reps    External Rotation PROM;5 reps    Internal  Rotation PROM;5 reps    Flexion PROM;5 reps    ABduction PROM;5 reps    Other Supine Exercises serratus anterior punch; 10X with 1#      Shoulder Exercises: Prone   Retraction AROM;10 reps    Extension AROM;10 reps   hold for 3"   Horizontal ABduction 1 AAROM;10 reps      Shoulder Exercises: Standing   Extension Theraband;10 reps    Theraband Level (Shoulder Extension) Level 2 (Red)    Row Theraband;10 reps    Theraband Level (Shoulder Row) Level 2 (Red)      Shoulder Exercises: Isometric Strengthening   Extension Other (comment)   standing; yellow band, step back/step forward 10X with arm extended at her side   External Rotation Other (comment)   standing; yellow band; 10X, with arm at 0 degrees er   Internal Rotation Other (comment)   standing; yellow band; 10X with arm positioned in 0 degrees IR.     Manual Therapy   Manual Therapy Myofascial release    Manual therapy comments completed separately from therapeutic exercises    Myofascial Release  myofascial release and manual techniques to right upper arm, anterior shoulder, and trapezius regions to decrease pain and fascial restrictions and increase joint ROM                    OT Education - 04/11/21 1432     Education Details scapular strengthening - red band row and extension    Person(s) Educated Patient    Methods Explanation;Handout;Demonstration;Verbal cues;Tactile cues    Comprehension Verbalized understanding;Returned demonstration              OT Short Term Goals - 03/31/21 1214       OT SHORT TERM GOAL #1   Title Pt will be provided with and educated on HEP to improve mobility required for RUE use as dominant during daily tasks.    Time 6    Period Weeks    Status On-going    Target Date 04/10/21      OT SHORT TERM GOAL #2   Title Patient will report a pain level of approximately 5/10 when using her RUE to complete daily tasks.    Time 6    Period Weeks      OT SHORT TERM GOAL #3   Title Pt will decrease RUE fascial restrictions to minimal amounts or less to improve ability to perform functional reaching tasks.    Time 6    Period Weeks    Status On-going      OT SHORT TERM GOAL #4   Title Pt will increase RUE P/ROM to [facility] to improve ability to complete waist level reaching tasks and assist with dressing and bathing.    Time 6    Period Weeks    Status On-going      OT SHORT TERM GOAL #5   Title Pt will increase RUE strength to 3/5 or greater to improve ability to reach to shoulder level or below.    Time 6    Period Weeks    Status On-going               OT Long Term Goals - 03/03/21 1207       OT LONG TERM GOAL #1   Title Patient will increase her RUE A/ROM to [facility] in order to return to functional reaching tasks at and above shoulder level.    Time 12    Period Weeks  Status On-going    Target Date 05/22/21      OT LONG TERM GOAL #2   Title Patient will increase her RUE strength to 4/5 in order to return to lifting  and managing items of moderate weight at home.    Time 12    Period Weeks    Status On-going      OT LONG TERM GOAL #3   Title Patient will report a pain level of approximately 3/10 or less in her RUE when completing all self care tasks.    Time 12    Period Weeks    Status On-going      OT LONG TERM GOAL #4   Title Patient will decrease her right shoulder fascial restrictions to min amount or less in order to increase the functional mobility needed to complete reaching tasks.    Time 12    Period Weeks    Status On-going                   Plan - 04/11/21 1503     Clinical Impression Statement A: Completed myofascial release to address reports of soreness along the right anterior shoulder joint. Focused on strengthening the infraspinatus muscle with isometric strengthening using the yellow band. VC and tactile cues required for form and technique. Patient demonstrated greater difficulty with maintain arm positioning during isometric external rotation. Provided scapular strengthening to HEP.    Body Structure / Function / Physical Skills ADL;Endurance;UE functional use;Fascial restriction;Pain;ROM;IADL;Strength    Plan P: review HEP and adjust if needed. Review which exercies to continue doing at home. Continue to strength anterior deltoid and infraspinatus. Banded isometrics.    OT Home Exercise Plan eval: table slides, A/ROM wrist, elbow, forearm, pendulums; 1/9: AA/ROM exercises 1/13: Using wall to work on decreasing shoulder abduction 1/24: Scapular strengthening with red band    Consulted and Agree with Plan of Care Patient             Patient will benefit from skilled therapeutic intervention in order to improve the following deficits and impairments:   Body Structure / Function / Physical Skills: ADL, Endurance, UE functional use, Fascial restriction, Pain, ROM, IADL, Strength       Visit Diagnosis: Stiffness of right shoulder, not elsewhere classified  Other  symptoms and signs involving the musculoskeletal system  Acute pain of right shoulder    Problem List Patient Active Problem List   Diagnosis Date Noted   Arthritis of right glenohumeral joint 02/16/2021   Impacted cerumen of both ears 12/01/2020   HTN (hypertension) 10/19/2020   HLD (hyperlipidemia) 10/19/2020   Hypothyroidism 10/19/2020   Primary osteoarthritis of right shoulder 10/19/2020   Macular pucker, left eye 08/27/2019   Macular retinoschisis, left 08/27/2019   Posterior vitreous detachment of both eyes 08/27/2019   Type 2 diabetes mellitus with other specified complication ([place]) [id number]   Iron deficiency anemia 04/06/2019    Ailene Ravel, OTR/L,CBIS  [phone]  04/11/2021, 3:09 PM  [place] [address] [place], [place], [address] Phone: [phone]   Fax:  [phone]  Name: SHEMIAH DEERY MRN: [id number] Date of Birth: 1940-04-14

## 2021-04-11 NOTE — Patient Instructions (Signed)
°  2) (Clinic) Extension / Flexion (Assist)   Face anchor, pull arms back, keeping elbow straight, and squeze shoulder blades together. Repeat 10-15 times.  1 times/day.   Copyright  VHI. All rights reserved.   3) (Home) Retraction: Row - Bilateral (Anchor)   Facing anchor, arms reaching forward, pull hands toward stomach, keeping elbows bent and at your sides and pinching shoulder blades together. Repeat 10-15 times. 1 times/day.   Copyright  VHI. All rights reserved.   

## 2021-04-14 ENCOUNTER — Other Ambulatory Visit: Payer: Self-pay

## 2021-04-14 ENCOUNTER — Encounter (HOSPITAL_COMMUNITY): Payer: Self-pay

## 2021-04-14 ENCOUNTER — Ambulatory Visit (HOSPITAL_COMMUNITY): Payer: Medicare Other

## 2021-04-14 DIAGNOSIS — M25511 Pain in right shoulder: Secondary | ICD-10-CM

## 2021-04-14 DIAGNOSIS — M25611 Stiffness of right shoulder, not elsewhere classified: Secondary | ICD-10-CM

## 2021-04-14 DIAGNOSIS — R29898 Other symptoms and signs involving the musculoskeletal system: Secondary | ICD-10-CM

## 2021-04-14 NOTE — Patient Instructions (Addendum)
Repeat all exercises 10-15 times, 1-2 times per day.  1) Shoulder Protraction    Begin with elbows by your side, slowly "punch" straight out in front of you.      2) Shoulder Flexion (GOAL: shoulder height)  Sitting/Standing:         Begin with arms at your side with thumbs pointed up, slowly raise both arms up and forward towards overhead.     3) Horizontal abduction/adduction (Complete with just the right arm only. NOT both arms at the same time)   Sitting/Standing:           Begin with arms straight out in front of you, bring out to the side in at "T" shape. Keep arms straight entire time.          5) Shoulder Abduction (GOAL: Shoulder height)   Sitting/Standing:       Begin with your arms next to your side. Slowly move your arms out to the side so that they go overhead, in a jumping jack or snow angel movement.       3) Internal/External ROTATION   In the standing position, hold a wand/cane with both hands keeping your elbows bent. Move your arms and wand/cane to one side.  Your affected arm should be partially relaxed while your unaffected arm performs most of the effort.             Focus on right elbow relaxed by your side. Complete throughout the day.    2) (Clinic) Extension / Flexion (Assist)   Face anchor, pull arms back, keeping elbow straight, and squeze shoulder blades together. Repeat 10-15 times. 1 times/day.   Copyright  VHI. All rights reserved.    3) (Home) Retraction: Row - Bilateral (Anchor)   Facing anchor, arms reaching forward, pull hands toward stomach, keeping elbows bent and at your sides and pinching shoulder blades together. Repeat 10-15 times. 1 times/day.

## 2021-04-14 NOTE — Therapy (Signed)
[place], [place], [address] Phone: [phone]   Fax:  [phone]  Occupational Therapy Treatment  Patient Details  Name: Lisa Beck MRN: [id number] Date of Birth: Feb 15, 1941 Referring Provider (OT): Larena Glassman, MD   Encounter Date: 04/14/2021   OT End of Session - 04/14/21 1225     Visit Number 12    Number of Visits 24    Date for OT Re-Evaluation 05/22/21   mini-reassessment 04/24/21   Authorization Type 1) Medicare A & B 2) Tricare for life    Progress Note Due on Visit 41    OT Start Time 1115    OT Stop Time 1153    OT Time Calculation (min) 38 min    Activity Tolerance Patient tolerated treatment well    Behavior During Therapy [facility] for tasks assessed/performed             Past Medical History:  Diagnosis Date   Arthritis    Diabetes mellitus without complication ([place])    History of kidney stones    HOH (hard of hearing)    Hyperlipidemia    Hypertension    Hypothyroidism     Past Surgical History:  Procedure Laterality Date   COLONOSCOPY  2007   Dr. Laural Golden: diverticulosis   COLONOSCOPY WITH PROPOFOL N/A 04/24/2019   Dr. Gala Romney: Diverticulosis in the sigmoid colon, two 5 to 9 mm polyps removed from the descending and ascending colon.  Pathology revealed both to be benign polyps with no adenomatous changes. No future surveillance colonoscopies due to age.   ESOPHAGOGASTRODUODENOSCOPY (EGD) WITH PROPOFOL N/A 04/24/2019   Dr. Gala Romney: Small hiatal hernia, one gastric polyp benign.   POLYPECTOMY  04/24/2019   Procedure: POLYPECTOMY;  Surgeon: Daneil Dolin, MD;  [facility];  Service: Endoscopy;;  ascending;   REVERSE SHOULDER ARTHROPLASTY Right 02/16/2021   Procedure: REVERSE SHOULDER ARTHROPLASTY;  Surgeon: Mordecai Rasmussen, MD;  Location: [facility];  Service: Orthopedics;  Laterality: Right;    There were no vitals filed for this visit.   Subjective Assessment - 04/14/21 1120      Subjective  S:    Currently in Pain? No/denies                [facility] OT Assessment - 04/14/21 1122       Assessment   Medical Diagnosis Right reverse shoulder arthroplasty      Precautions   Precautions Shoulder    Type of Shoulder Precautions See media tab for protocol.                      OT Treatments/Exercises (OP) - 04/14/21 0001       Exercises   Exercises Shoulder      Shoulder Exercises: Seated   Protraction AROM;10 reps    External Rotation AROM;AAROM;10 reps    Internal Rotation AROM;AAROM;10 reps    Flexion AROM;10 reps    Abduction AROM;10 reps      Shoulder Exercises: Standing   Extension Theraband;10 reps    Theraband Level (Shoulder Extension) Level 2 (Red)    Row Theraband;10 reps    Theraband Level (Shoulder Row) Level 2 (Red)      Shoulder Exercises: Isometric Strengthening   Flexion Other (comment)   10X, Arm extended by side (adducted) forward and backward stepping.   Extension Other (comment)   standing; yellow band, step back/step forward 10X with arm extended at her side  Internal Rotation Other (comment)   standing; yellow band; 10X with arm positioned in 0 degrees IR.   ABduction Other (comment)   10X side step out/in with arm extended by side (shoulder adducted)   ADduction Other (comment)   10X side step out and in with arm extended by side (adducted)   Other Isometric Exercises shoulder extension, using washcloth on counter 1x10"                    OT Education - 04/14/21 1223     Education Details isometric shoulder extension on countertop    Person(s) Educated Patient    Methods Explanation    Comprehension Verbalized understanding              OT Short Term Goals - 03/31/21 1214       OT SHORT TERM GOAL #1   Title Pt will be provided with and educated on HEP to improve mobility required for RUE use as dominant during daily tasks.    Time 6    Period Weeks    Status On-going    Target Date  04/10/21      OT SHORT TERM GOAL #2   Title Patient will report a pain level of approximately 5/10 when using her RUE to complete daily tasks.    Time 6    Period Weeks      OT SHORT TERM GOAL #3   Title Pt will decrease RUE fascial restrictions to minimal amounts or less to improve ability to perform functional reaching tasks.    Time 6    Period Weeks    Status On-going      OT SHORT TERM GOAL #4   Title Pt will increase RUE P/ROM to [facility] to improve ability to complete waist level reaching tasks and assist with dressing and bathing.    Time 6    Period Weeks    Status On-going      OT SHORT TERM GOAL #5   Title Pt will increase RUE strength to 3/5 or greater to improve ability to reach to shoulder level or below.    Time 6    Period Weeks    Status On-going               OT Long Term Goals - 03/03/21 1207       OT LONG TERM GOAL #1   Title Patient will increase her RUE A/ROM to [facility] in order to return to functional reaching tasks at and above shoulder level.    Time 12    Period Weeks    Status On-going    Target Date 05/22/21      OT LONG TERM GOAL #2   Title Patient will increase her RUE strength to 4/5 in order to return to lifting and managing items of moderate weight at home.    Time 12    Period Weeks    Status On-going      OT LONG TERM GOAL #3   Title Patient will report a pain level of approximately 3/10 or less in her RUE when completing all self care tasks.    Time 12    Period Weeks    Status On-going      OT LONG TERM GOAL #4   Title Patient will decrease her right shoulder fascial restrictions to min amount or less in order to increase the functional mobility needed to complete reaching tasks.    Time 12  Period Weeks    Status On-going                   Plan - 04/14/21 1225     Clinical Impression Statement A: Reviewed HEP and removed exercises no longer needed. Provided new exercises and combined all exercises into one packet.  COmpleted shoulder isometrics using yellow band again. VC for form and technique provided. Patient reports difficulty self feeding and at end of session, OT provided an isometric strengthening exercise to complete at home with plan to focus on next session.    Body Structure / Function / Physical Skills ADL;Endurance;UE functional use;Fascial restriction;Pain;ROM;IADL;Strength    Plan P: increase functional performance during self feeding, assess difficulty next session.    Consulted and Agree with Plan of Care Patient             Patient will benefit from skilled therapeutic intervention in order to improve the following deficits and impairments:   Body Structure / Function / Physical Skills: ADL, Endurance, UE functional use, Fascial restriction, Pain, ROM, IADL, Strength       Visit Diagnosis: Stiffness of right shoulder, not elsewhere classified  Acute pain of right shoulder  Other symptoms and signs involving the musculoskeletal system    Problem List Patient Active Problem List   Diagnosis Date Noted   Arthritis of right glenohumeral joint 02/16/2021   Impacted cerumen of both ears 12/01/2020   HTN (hypertension) 10/19/2020   HLD (hyperlipidemia) 10/19/2020   Hypothyroidism 10/19/2020   Primary osteoarthritis of right shoulder 10/19/2020   Macular pucker, left eye 08/27/2019   Macular retinoschisis, left 08/27/2019   Posterior vitreous detachment of both eyes 08/27/2019   Type 2 diabetes mellitus with other specified complication ([place]) [id number]   Iron deficiency anemia 04/06/2019   Lisa Beck, OTR/L,CBIS  [phone]  04/14/2021, 12:29 PM  [place], [place], [address] Phone: [phone]   Fax:  [phone]  Name: Lisa Beck MRN: [id number] Date of Birth: July 13, 1940

## 2021-04-18 ENCOUNTER — Ambulatory Visit (HOSPITAL_COMMUNITY): Payer: Medicare Other

## 2021-04-18 ENCOUNTER — Other Ambulatory Visit: Payer: Self-pay

## 2021-04-18 ENCOUNTER — Encounter (HOSPITAL_COMMUNITY): Payer: Self-pay

## 2021-04-18 DIAGNOSIS — M25611 Stiffness of right shoulder, not elsewhere classified: Secondary | ICD-10-CM

## 2021-04-18 DIAGNOSIS — R29898 Other symptoms and signs involving the musculoskeletal system: Secondary | ICD-10-CM

## 2021-04-18 DIAGNOSIS — M25511 Pain in right shoulder: Secondary | ICD-10-CM

## 2021-04-18 NOTE — Therapy (Signed)
[place], [place], [address] Phone: [phone]   Fax:  [phone]  Occupational Therapy Treatment  Patient Details  Name: Lisa Beck MRN: [id number] Date of Birth: 1940-04-21 Referring Provider (OT): Larena Glassman, MD   Encounter Date: 04/18/2021   OT End of Session - 04/18/21 1359     Visit Number 13    Number of Visits 24    Date for OT Re-Evaluation 05/22/21   mini-reassessment 04/24/21   Authorization Type 1) Medicare A & B 2) Tricare for life    Progress Note Due on Visit 11    OT Start Time 1300    OT Stop Time 1338    OT Time Calculation (min) 38 min    Activity Tolerance Patient tolerated treatment well    Behavior During Therapy [facility] for tasks assessed/performed             Past Medical History:  Diagnosis Date   Arthritis    Diabetes mellitus without complication ([place])    History of kidney stones    HOH (hard of hearing)    Hyperlipidemia    Hypertension    Hypothyroidism     Past Surgical History:  Procedure Laterality Date   COLONOSCOPY  2007   Dr. Laural Golden: diverticulosis   COLONOSCOPY WITH PROPOFOL N/A 04/24/2019   Dr. Gala Romney: Diverticulosis in the sigmoid colon, two 5 to 9 mm polyps removed from the descending and ascending colon.  Pathology revealed both to be benign polyps with no adenomatous changes. No future surveillance colonoscopies due to age.   ESOPHAGOGASTRODUODENOSCOPY (EGD) WITH PROPOFOL N/A 04/24/2019   Dr. Gala Romney: Small hiatal hernia, one gastric polyp benign.   POLYPECTOMY  04/24/2019   Procedure: POLYPECTOMY;  Surgeon: Daneil Dolin, MD;  [facility];  Service: Endoscopy;;  ascending;   REVERSE SHOULDER ARTHROPLASTY Right 02/16/2021   Procedure: REVERSE SHOULDER ARTHROPLASTY;  Surgeon: Mordecai Rasmussen, MD;  Location: [facility] OR[facility];  Service: Orthopedics;  Laterality: Right;    There were no vitals filed for this visit.   Subjective Assessment - 04/18/21 1309      Subjective  S: It's doing good. I'm getting better at eating with this arm too.    Currently in Pain? No/denies                [facility] OT Assessment - 04/18/21 1324       Assessment   Medical Diagnosis Right reverse shoulder arthroplasty      Precautions   Precautions Shoulder    Type of Shoulder Precautions See media tab for protocol.                      OT Treatments/Exercises (OP) - 04/18/21 0001       Exercises   Exercises Shoulder      Shoulder Exercises: Standing   Extension Theraband;12 reps    Theraband Level (Shoulder Extension) Level 2 (Red)    Row Theraband;12 reps    Theraband Level (Shoulder Row) Level 2 (Red)      Shoulder Exercises: ROM/Strengthening   UBE (Upper Arm Bike) Level 2 3' reverse only pace: 2.5-3.0    Over Head Lace seated. Started at top of chain and laced from top to bottom. Removed tubing once the bottom was reached. When removing tubing, pt stood once half way mark was reached.    Wall Pushups 10 reps    Ball on Wall small pink ball; 1'  flexion    Other ROM/Strengthening Exercises Scapular push ups, wall standing; 10X. with tactile cueing.      Shoulder Exercises: Isometric Strengthening   Flexion Other (comment)   12X, Arm extended by side (adducted) forward and backward stepping.   Extension Other (comment)   standing; yellow band, step back/step forward 12X with arm extended at her side                     OT Short Term Goals - 03/31/21 1214       OT SHORT TERM GOAL #1   Title Pt will be provided with and educated on HEP to improve mobility required for RUE use as dominant during daily tasks.    Time 6    Period Weeks    Status On-going    Target Date 04/10/21      OT SHORT TERM GOAL #2   Title Patient will report a pain level of approximately 5/10 when using her RUE to complete daily tasks.    Time 6    Period Weeks      OT SHORT TERM GOAL #3   Title Pt will decrease RUE fascial restrictions to  minimal amounts or less to improve ability to perform functional reaching tasks.    Time 6    Period Weeks    Status On-going      OT SHORT TERM GOAL #4   Title Pt will increase RUE P/ROM to [facility] to improve ability to complete waist level reaching tasks and assist with dressing and bathing.    Time 6    Period Weeks    Status On-going      OT SHORT TERM GOAL #5   Title Pt will increase RUE strength to 3/5 or greater to improve ability to reach to shoulder level or below.    Time 6    Period Weeks    Status On-going               OT Long Term Goals - 03/03/21 1207       OT LONG TERM GOAL #1   Title Patient will increase her RUE A/ROM to [facility] in order to return to functional reaching tasks at and above shoulder level.    Time 12    Period Weeks    Status On-going    Target Date 05/22/21      OT LONG TERM GOAL #2   Title Patient will increase her RUE strength to 4/5 in order to return to lifting and managing items of moderate weight at home.    Time 12    Period Weeks    Status On-going      OT LONG TERM GOAL #3   Title Patient will report a pain level of approximately 3/10 or less in her RUE when completing all self care tasks.    Time 12    Period Weeks    Status On-going      OT LONG TERM GOAL #4   Title Patient will decrease her right shoulder fascial restrictions to min amount or less in order to increase the functional mobility needed to complete reaching tasks.    Time 12    Period Weeks    Status On-going                   Plan - 04/18/21 1359     Clinical Impression Statement A: Continued to focus on shoulder and scapular strengthening while decreasing un-necessary shoulder  abduction while functional reaching. Increased resistance with UBE bike in reverse. Added scapular stability and strengthening against the wall/door. Overhead lacing was completed seated then finished standing due to muscle fatigue and leading to compensatory muscle movements.  VC and tactile cues provided for form and technique.    Body Structure / Function / Physical Skills ADL;Endurance;UE functional use;Fascial restriction;Pain;ROM;IADL;Strength    Plan P:mini reassess    Consulted and Agree with Plan of Care Patient             Patient will benefit from skilled therapeutic intervention in order to improve the following deficits and impairments:   Body Structure / Function / Physical Skills: ADL, Endurance, UE functional use, Fascial restriction, Pain, ROM, IADL, Strength       Visit Diagnosis: Stiffness of right shoulder, not elsewhere classified  Acute pain of right shoulder  Other symptoms and signs involving the musculoskeletal system    Problem List Patient Active Problem List   Diagnosis Date Noted   Arthritis of right glenohumeral joint 02/16/2021   Impacted cerumen of both ears 12/01/2020   HTN (hypertension) 10/19/2020   HLD (hyperlipidemia) 10/19/2020   Hypothyroidism 10/19/2020   Primary osteoarthritis of right shoulder 10/19/2020   Macular pucker, left eye 08/27/2019   Macular retinoschisis, left 08/27/2019   Posterior vitreous detachment of both eyes 08/27/2019   Type 2 diabetes mellitus with other specified complication ([place]) [id number]   Iron deficiency anemia 04/06/2019   Ailene Ravel, OTR/L,CBIS  [phone]  04/18/2021, 2:02 PM  [place], [place], [address] Phone: [phone]   Fax:  [phone]  Name: Lisa Beck MRN: [id number] Date of Birth: 02-28-1941

## 2021-04-21 ENCOUNTER — Encounter (HOSPITAL_COMMUNITY): Payer: Self-pay

## 2021-04-21 ENCOUNTER — Other Ambulatory Visit: Payer: Self-pay

## 2021-04-21 ENCOUNTER — Ambulatory Visit (HOSPITAL_COMMUNITY): Payer: Medicare Other | Attending: Orthopaedic Surgery

## 2021-04-21 DIAGNOSIS — M25511 Pain in right shoulder: Secondary | ICD-10-CM | POA: Insufficient documentation

## 2021-04-21 DIAGNOSIS — M25611 Stiffness of right shoulder, not elsewhere classified: Secondary | ICD-10-CM | POA: Diagnosis not present

## 2021-04-21 DIAGNOSIS — R29898 Other symptoms and signs involving the musculoskeletal system: Secondary | ICD-10-CM | POA: Insufficient documentation

## 2021-04-21 NOTE — Patient Instructions (Signed)
Complete 10 times. Complete every other day.  Shoulder External Rotation Iso walk out  -Stand facing as shown in picture -Shoulders down and back -Towel roll under arm -Keep elbow at 90deg flexion entire time -Take 3 steps away from band and 3 steps back towards band -Do not move arm        Backward Walkouts  Secure the elastic band in a door jamb  Standing facing the door, hold the band at your side one band in each hand  Carefully step away from the door as far as the band will allow    Sidestep Walkouts  Secure the elastic band in a door jamb  Standing sideways, hold the band at your side   Step away from the door as far as the band will allow      Isometric shoulder flexion  Attach 1 end of band to a door.  Holding other end of band in hand of affected arm step forward tightening band and then return to starting position.

## 2021-04-22 NOTE — Therapy (Signed)
[place], [place], [address] Phone: [phone]   Fax:  [phone]  Occupational Therapy Treatment  Patient Details  Name: Lisa Beck MRN: [id number] Date of Birth: 12/04/1940 Referring Provider (OT): Larena Glassman, MD   Encounter Date: 04/21/2021   OT End of Session - 04/22/21 1113     Visit Number 14    Number of Visits 24    Authorization Type 1) Medicare A & B 2) Tricare for life    Progress Note Due on Visit 32    OT Start Time 1115   reassess/discharge   OT Stop Time 1153    OT Time Calculation (min) 38 min    Activity Tolerance Patient tolerated treatment well    Behavior During Therapy [facility] for tasks assessed/performed             Past Medical History:  Diagnosis Date   Arthritis    Diabetes mellitus without complication ([place])    History of kidney stones    HOH (hard of hearing)    Hyperlipidemia    Hypertension    Hypothyroidism     Past Surgical History:  Procedure Laterality Date   COLONOSCOPY  2007   Dr. Laural Golden: diverticulosis   COLONOSCOPY WITH PROPOFOL N/A 04/24/2019   Dr. Gala Romney: Diverticulosis in the sigmoid colon, two 5 to 9 mm polyps removed from the descending and ascending colon.  Pathology revealed both to be benign polyps with no adenomatous changes. No future surveillance colonoscopies due to age.   ESOPHAGOGASTRODUODENOSCOPY (EGD) WITH PROPOFOL N/A 04/24/2019   Dr. Gala Romney: Small hiatal hernia, one gastric polyp benign.   POLYPECTOMY  04/24/2019   Procedure: POLYPECTOMY;  Surgeon: Daneil Dolin, MD;  [facility];  Service: Endoscopy;;  ascending;   REVERSE SHOULDER ARTHROPLASTY Right 02/16/2021   Procedure: REVERSE SHOULDER ARTHROPLASTY;  Surgeon: Mordecai Rasmussen, MD;  Location: [facility];  Service: Orthopedics;  Laterality: Right;    There were no vitals filed for this visit.   Subjective Assessment - 04/21/21 1120     Subjective  S: I'm working on everything at  home. Eating is getting better.    Currently in Pain? No/denies                [facility] OT Assessment - 04/21/21 1120       Assessment   Medical Diagnosis Right reverse shoulder arthroplasty      Precautions   Precautions Shoulder    Type of Shoulder Precautions See media tab for protocol.      Observation/Other Assessments   Focus on Therapeutic Outcomes (FOTO)  60/100      ROM / Strength   AROM / PROM / Strength AROM;Strength      Palpation   Palpation comment trace fascial restrictions in the right upper arm, upper trapezius, and scapularis region.      AROM   Overall AROM Comments Assessed seated, er/IR adducted    AROM Assessment Site Shoulder    Right/Left Shoulder Right    Right Shoulder Flexion 126 Degrees   previous: 115   Right Shoulder ABduction 110 Degrees   previous: 105   Right Shoulder Internal Rotation 90 Degrees   previous: same   Right Shoulder External Rotation 19 Degrees   previous: 4     PROM   Overall PROM  Within functional limits for tasks performed      Strength   Overall Strength Comments Assessed seated, er/IR adducted  Strength Assessment Site Shoulder    Right/Left Shoulder Right    Right Shoulder Flexion 4/5   previous: 3-/5   Right Shoulder ABduction 5/5   previous: 3-/5   Right Shoulder Internal Rotation 5/5   previous: 3/5   Right Shoulder External Rotation 3-/5   previous: 3-/5                     OT Treatments/Exercises (OP) - 04/21/21 1151       Exercises   Exercises Shoulder      Shoulder Exercises: Isometric Strengthening   Flexion Other (comment)   10X, red band, Arm extended by side (adducted) forward and backward stepping.   Extension Other (comment)   standing; red band, step back/step forward 10X with arm extended at her side   External Rotation Other (comment)   standing; yellow band; 10X, with arm at 0 degrees er. Towel roll   ADduction Other (comment)   10X side step out and in with arm extended by  side (adducted), red band                   OT Education - 04/21/21 1147     Education Details Isometric shoulder strengthening with yellow and red band    Person(s) Educated Patient    Methods Explanation;Demonstration;Verbal cues;Handout    Comprehension Returned demonstration;Verbalized understanding              OT Short Term Goals - 04/21/21 1130       OT SHORT TERM GOAL #1   Title Pt will be provided with and educated on HEP to improve mobility required for RUE use as dominant during daily tasks.    Time 6    Period Weeks    Status Achieved    Target Date 04/10/21      OT SHORT TERM GOAL #2   Title Patient will report a pain level of approximately 5/10 when using her RUE to complete daily tasks.    Time 6    Period Weeks      OT SHORT TERM GOAL #3   Title Pt will decrease RUE fascial restrictions to minimal amounts or less to improve ability to perform functional reaching tasks.    Time 6    Period Weeks    Status Achieved      OT SHORT TERM GOAL #4   Title Pt will increase RUE P/ROM to [facility] to improve ability to complete waist level reaching tasks and assist with dressing and bathing.    Time 6    Period Weeks    Status Achieved      OT SHORT TERM GOAL #5   Title Pt will increase RUE strength to 3/5 or greater to improve ability to reach to shoulder level or below.    Time 6    Period Weeks    Status Achieved               OT Long Term Goals - 04/21/21 1131       OT LONG TERM GOAL #1   Title Patient will increase her RUE A/ROM to [facility] in order to return to functional reaching tasks at and above shoulder level.    Time 12    Period Weeks    Status Partially Met    Target Date 05/22/21      OT LONG TERM GOAL #2   Title Patient will increase her RUE strength to 4/5 in order to return to  lifting and managing items of moderate weight at home.    Time 12    Period Weeks    Status Partially Met      OT LONG TERM GOAL #3   Title Patient  will report a pain level of approximately 3/10 or less in her RUE when completing all self care tasks.    Time 12    Period Weeks    Status Achieved      OT LONG TERM GOAL #4   Title Patient will decrease her right shoulder fascial restrictions to min amount or less in order to increase the functional mobility needed to complete reaching tasks.    Time 12    Period Weeks    Status Achieved                   Plan - 04/22/21 1114     Clinical Impression Statement A: Pt met all short term goals and 2/4 long term goals with remaining 2 partially met. Patient reports and demonstrates improvement with A/ROM. She is consistantly able to complete functional reaching tasks to shoulder level with little to no difficulty. She is using her RUE as close to her dominant extremity as she is able. SHe continues to have limitations with strength and ROM although is able to continue working on these deficits independently at home. HEP has been updated and reviewed. All education has been completed.    Body Structure / Function / Physical Skills ADL;Endurance;UE functional use;Fascial restriction;Pain;ROM;IADL;Strength    Plan P: Discharge from OT services with HEP.    OT Home Exercise Plan eval: table slides, A/ROM wrist, elbow, forearm, pendulums; 1/9: AA/ROM exercises 1/13: Using wall to work on decreasing shoulder abduction 1/24: Scapular strengthening with red band 2/3: isometric shoulder strengthening with yellow and red band.    Consulted and Agree with Plan of Care Patient             Patient will benefit from skilled therapeutic intervention in order to improve the following deficits and impairments:   Body Structure / Function / Physical Skills: ADL, Endurance, UE functional use, Fascial restriction, Pain, ROM, IADL, Strength       Visit Diagnosis: Stiffness of right shoulder, not elsewhere classified  Acute pain of right shoulder  Other symptoms and signs involving the  musculoskeletal system    Problem List Patient Active Problem List   Diagnosis Date Noted   Arthritis of right glenohumeral joint 02/16/2021   Impacted cerumen of both ears 12/01/2020   HTN (hypertension) 10/19/2020   HLD (hyperlipidemia) 10/19/2020   Hypothyroidism 10/19/2020   Primary osteoarthritis of right shoulder 10/19/2020   Macular pucker, left eye 08/27/2019   Macular retinoschisis, left 08/27/2019   Posterior vitreous detachment of both eyes 08/27/2019   Type 2 diabetes mellitus with other specified complication ([place]) 25/36/6440   Iron deficiency anemia 04/06/2019   OCCUPATIONAL THERAPY DISCHARGE SUMMARY  Visits from Start of Care: 14  Current functional level related to goals / functional outcomes: See above   Remaining deficits: See above   Education / Equipment: See above  Patient agrees to discharge. Patient goals were  mostly met (all STGs, 2/4 LTGs . Patient is being discharged due to being pleased with the current functional level.Ailene Ravel, OTR/L,CBIS  [phone]  04/22/2021, 11:20 AM  [place] [address] [place], [place], [address] Phone: [phone]   Fax:  [phone]  Name: Lisa Beck  MRN: [id number] Date of Birth: [birth year]

## 2021-05-01 ENCOUNTER — Telehealth: Payer: Self-pay | Admitting: Orthopedic Surgery

## 2021-05-01 NOTE — Telephone Encounter (Signed)
Call received from patient's dental office, [facility], relaying that patient came there for dental cleaning today, and had told them that she had a recent shoulder replacement. They have cancelled her appointment for today, pending Dr Dallas Schimke' advice as to whether pre-antibiotics are needed prior.   States that their dentists request that the orthopaedic surgeon order medication if needed, and also request a letter indicating that pre-med is needed, for filing in patient's chart. Ph# [phone], Fax# [phone]

## 2021-05-01 NOTE — Telephone Encounter (Signed)
Noted  

## 2021-05-02 ENCOUNTER — Encounter (HOSPITAL_COMMUNITY): Payer: Medicare Other

## 2021-05-03 NOTE — Telephone Encounter (Signed)
Called and let office know none are required, letter also faxed.

## 2021-05-05 ENCOUNTER — Encounter (HOSPITAL_COMMUNITY): Payer: Medicare Other

## 2021-05-09 ENCOUNTER — Encounter (HOSPITAL_COMMUNITY): Payer: Medicare Other

## 2021-05-12 ENCOUNTER — Encounter: Payer: Medicare Other | Admitting: Orthopedic Surgery

## 2021-05-12 ENCOUNTER — Encounter: Payer: Self-pay | Admitting: Orthopedic Surgery

## 2021-05-12 ENCOUNTER — Encounter (HOSPITAL_COMMUNITY): Payer: Medicare Other

## 2021-05-12 ENCOUNTER — Ambulatory Visit (INDEPENDENT_AMBULATORY_CARE_PROVIDER_SITE_OTHER): Payer: Medicare Other | Admitting: Orthopedic Surgery

## 2021-05-12 ENCOUNTER — Ambulatory Visit: Payer: Medicare Other

## 2021-05-12 ENCOUNTER — Other Ambulatory Visit: Payer: Self-pay

## 2021-05-12 DIAGNOSIS — Z96611 Presence of right artificial shoulder joint: Secondary | ICD-10-CM | POA: Diagnosis not present

## 2021-05-12 NOTE — Patient Instructions (Signed)
Continue with your exercises.   Follow up in 3 months  Call with concerns

## 2021-05-12 NOTE — Progress Notes (Signed)
Orthopaedic Postop Note  Assessment: Lisa Beck is a 81 y.o. female s/p Right Reverse Shoulder Arthroplasty  DOS: 02/16/2021  Plan: Radiographs reviewed, stable appearance. She denies pain.  She is very pleased with her progress to date. Continue working on home exercise program.  Anticipate that she will continue to improve her strength and overall range of motion.  Okay to gradually increase her level of activity.  Follow-up in 3 months for repeat evaluation.  Follow-up: Return in about 3 months (around 08/09/2021).  XR at next visit: Right shoulder  Subjective:  Chief Complaint  Patient presents with   Routine Post Op    S/p reverse arthroplasty of RIGHT shoulder DOS 02/16/21    History of Present Illness: Lisa Beck is a 81 y.o. female who presents following the above stated procedure.  Surgery was 3 months ago.  She has no pain in her right shoulder.  She is happy with her progress.  She is pleased that she had the surgery.  She is no longer going to physical therapy.  She continues to do the exercises on her own.  She is hopeful to increase her level of activity, as the weather improves, she wants to be outside.    Review of Systems: No fevers or chills No numbness or tingling No Chest Pain No shortness of breath   Objective: There were no vitals taken for this visit.  Physical Exam:  Alert and oriented.  No acute distress.  Right shoulder surgical incision is healing well.  No surrounding erythema or drainage.  No tenderness to palpation.  Active forward elevation to 130 degrees.  Passively forward flexion to 160 degrees.  Internal rotation to her lumbar spine.  Limited external rotation at her side.  Fingers are warm and well-perfused.  2+ radial pulse.  IMAGING: I personally ordered and reviewed the following images:  Right shoulder x-ray demonstrates a well-positioned reverse shoulder arthroplasty.  No evidence of hardware failure or subsidence.   Overall alignment remains unchanged.  No acute injuries are noted.  Impression: Right reverse shoulder arthroplasty in stable position.   Oliver Barre, MD 05/12/2021 10:21 PM

## 2021-05-16 ENCOUNTER — Encounter (HOSPITAL_COMMUNITY): Payer: Medicare Other

## 2021-05-19 ENCOUNTER — Encounter (HOSPITAL_COMMUNITY): Payer: Medicare Other

## 2021-05-23 ENCOUNTER — Other Ambulatory Visit: Payer: Self-pay

## 2021-05-23 ENCOUNTER — Encounter: Payer: Self-pay | Admitting: Internal Medicine

## 2021-05-23 ENCOUNTER — Encounter (HOSPITAL_COMMUNITY): Payer: Medicare Other

## 2021-05-23 ENCOUNTER — Ambulatory Visit (INDEPENDENT_AMBULATORY_CARE_PROVIDER_SITE_OTHER): Payer: Medicare Other | Admitting: Internal Medicine

## 2021-05-23 VITALS — BP 128/64 | HR 84 | Resp 18 | Ht 64.0 in | Wt 121.4 lb

## 2021-05-23 DIAGNOSIS — E039 Hypothyroidism, unspecified: Secondary | ICD-10-CM | POA: Diagnosis not present

## 2021-05-23 DIAGNOSIS — E1169 Type 2 diabetes mellitus with other specified complication: Secondary | ICD-10-CM | POA: Diagnosis not present

## 2021-05-23 DIAGNOSIS — I10 Essential (primary) hypertension: Secondary | ICD-10-CM | POA: Diagnosis not present

## 2021-05-23 DIAGNOSIS — Z23 Encounter for immunization: Secondary | ICD-10-CM | POA: Diagnosis not present

## 2021-05-23 DIAGNOSIS — D509 Iron deficiency anemia, unspecified: Secondary | ICD-10-CM

## 2021-05-23 DIAGNOSIS — Z794 Long term (current) use of insulin: Secondary | ICD-10-CM

## 2021-05-23 NOTE — Assessment & Plan Note (Signed)
Lab Results  ?Component Value Date  ? TSH 0.119 (L) 01/23/2021  ? ?On Levothyroxine 100 mcg QD now ?Check TSH and free T4 ?

## 2021-05-23 NOTE — Assessment & Plan Note (Signed)
BP Readings from Last 1 Encounters:  ?05/23/21 128/64  ? ?Well-controlled with Amlodipine, Micardis HCT and Metoprolol ?Counseled for compliance with the medications ?Advised DASH diet and moderate exercise/walking as tolerated ?

## 2021-05-23 NOTE — Assessment & Plan Note (Signed)
No signs of active bleeding ?Check CBC ?

## 2021-05-23 NOTE — Progress Notes (Signed)
Established Patient Office Visit  Subjective:  Patient ID: Lisa Beck, female    DOB: 02-11-1941  Age: 81 y.o. MRN: [id number]  CC:  Chief Complaint  Patient presents with   Follow-up    4 month follow up HTN and DM     HPI LYBERTI Beck is a 81 y.o. female with past medical history of hypertension, DM, hypothyroidism, HLD and macular pucker of left eye who presents for f/u of her chronic medical conditions.  HTN: BP is well-controlled. Takes medications regularly. Patient denies headache, dizziness, chest pain, dyspnea or palpitations.   DM: She has been taking Lantus, Trulicity and metformin regularly. Her dose of Lantus was decreased to 22 U in the last visit. Her blood glucose has been running low in the morning, around 70s on some days. She denies any dizziness, polyuria or polyphagia.  Hypothyroidism: She takes levothyroxine regularly.  Denies any recent change in weight or appetite.  Denies any recent skin or hair changes.  Patient received PCV20 in the office today.  Past Medical History:  Diagnosis Date   Arthritis    Diabetes mellitus without complication ([place])    History of kidney stones    HOH (hard of hearing)    Hyperlipidemia    Hypertension    Hypothyroidism     Past Surgical History:  Procedure Laterality Date   COLONOSCOPY  2007   Dr. Laural Golden: diverticulosis   COLONOSCOPY WITH PROPOFOL N/A 04/24/2019   Dr. Gala Romney: Diverticulosis in the sigmoid colon, two 5 to 9 mm polyps removed from the descending and ascending colon.  Pathology revealed both to be benign polyps with no adenomatous changes. No future surveillance colonoscopies due to age.   ESOPHAGOGASTRODUODENOSCOPY (EGD) WITH PROPOFOL N/A 04/24/2019   Dr. Gala Romney: Small hiatal hernia, one gastric polyp benign.   POLYPECTOMY  04/24/2019   Procedure: POLYPECTOMY;  Surgeon: Daneil Dolin, MD;  Location: [facility];  Service: Endoscopy;;  ascending;   REVERSE SHOULDER ARTHROPLASTY Right 02/16/2021    Procedure: REVERSE SHOULDER ARTHROPLASTY;  Surgeon: Mordecai Rasmussen, MD;  Location: [facility];  Service: Orthopedics;  Laterality: Right;    Family History  Problem Relation Age of Onset   Breast cancer Mother    CAD Father        MI sometime in 96, age unknown   CAD Brother        Pt has 3 brothers that died of MIs between ages 72s-70s   CAD Brother    CAD Brother    Colon cancer Neg Hx    Ulcers Neg Hx     Social History   Socioeconomic History   Marital status: Married    Spouse name: Not on file   Number of children: Not on file   Years of education: Not on file   Highest education level: Not on file  Occupational History   Not on file  Tobacco Use   Smoking status: Never   Smokeless tobacco: Never  Vaping Use   Vaping Use: Never used  Substance and Sexual Activity   Alcohol use: No   Drug use: No   Sexual activity: Not Currently  Other Topics Concern   Not on file  Social History Narrative   Not on file   Social Determinants of Health   Financial Resource Strain: Low Risk    Difficulty of Paying Living Expenses: Not hard at all  Food Insecurity: No Food Insecurity   Worried About Charity fundraiser  in the Last Year: Never true   [place] in the Last Year: Never true  Transportation Needs: No Transportation Needs   Lack of Transportation (Medical): No   Lack of Transportation (Non-Medical): No  Physical Activity: Sufficiently Active   Days of Exercise per Week: 5 days   Minutes of Exercise per Session: 60 min  Stress: No Stress Concern Present   Feeling of Stress : Not at all  Social Connections: Moderately Isolated   Frequency of Communication with Friends and Family: Twice a week   Frequency of Social Gatherings with Friends and Family: Twice a week   Attends Religious Services: Never   Marine scientist or Organizations: No   Attends Music therapist: Never   Marital Status: Married  Human resources officer Violence: Not At  Risk   Fear of Current or Ex-Partner: No   Emotionally Abused: No   Physically Abused: No   Sexually Abused: No    Outpatient Medications Prior to Visit  Medication Sig Dispense Refill   amLODipine (NORVASC) 5 MG tablet Take 1 tablet (5 mg total) by mouth daily. 90 tablet 1   aspirin EC 81 MG tablet Take 81 mg by mouth daily.      atorvastatin (LIPITOR) 40 MG tablet Take 40 mg by mouth every evening.     carbamide peroxide (DEBROX) 6.5 % OTIC solution Place 5 drops into both ears 2 (two) times daily. 15 mL 0   Dulaglutide (TRULICITY) 4.23 NT/6.1WE SOPN Inject into the skin. Once a week     FREESTYLE LITE test strip USE TO TEST BLOOD SUGAR DAILY 100 strip 3   insulin glargine (LANTUS) 100 UNIT/ML injection Inject 0.22 mLs (22 Units total) into the skin at bedtime. 10 mL 1   levothyroxine (SYNTHROID) 100 MCG tablet Take 1 tablet (100 mcg total) by mouth daily. 90 tablet 1   metFORMIN (GLUCOPHAGE) 500 MG tablet Take 500 mg by mouth 2 (two) times daily.     metoprolol succinate (TOPROL-XL) 50 MG 24 hr tablet Take 50 mg by mouth daily. Take with or immediately following a meal.     telmisartan-hydrochlorothiazide (MICARDIS HCT) 40-12.5 MG per tablet Take 1 tablet by mouth daily.     VITAMIN D PO Take by mouth daily.     No facility-administered medications prior to visit.    No Known Allergies  ROS Review of Systems  Constitutional:  Negative for chills and fever.  HENT:  Positive for hearing loss. Negative for ear discharge, ear pain, sinus pressure, sinus pain and voice change.   Eyes:  Negative for pain and discharge.  Respiratory:  Negative for cough and shortness of breath.   Cardiovascular:  Negative for chest pain and palpitations.  Gastrointestinal:  Negative for nausea and vomiting.  Genitourinary:  Negative for dysuria and hematuria.  Musculoskeletal:  Negative for neck pain and neck stiffness.  Skin:  Negative for rash.  Neurological:  Negative for dizziness and weakness.   Psychiatric/Behavioral:  Negative for agitation and behavioral problems.      Objective:    Physical Exam Vitals reviewed.  Constitutional:      General: She is not in acute distress.    Appearance: She is not diaphoretic.  HENT:     Head: Normocephalic and atraumatic.     Nose: Nose normal.     Mouth/Throat:     Mouth: Mucous membranes are moist.  Eyes:     General: No scleral icterus.    Extraocular  Movements: Extraocular movements intact.  Cardiovascular:     Rate and Rhythm: Normal rate and regular rhythm.     Pulses: Normal pulses.     Heart sounds: Normal heart sounds. No murmur heard. Pulmonary:     Breath sounds: Normal breath sounds. No wheezing or rales.  Musculoskeletal:     Cervical back: Neck supple. No tenderness.     Right lower leg: No edema.     Left lower leg: No edema.  Skin:    General: Skin is warm.     Findings: No rash.  Neurological:     General: No focal deficit present.     Mental Status: She is alert and oriented to person, place, and time.     Sensory: No sensory deficit.     Motor: No weakness.  Psychiatric:        Mood and Affect: Mood normal.        Behavior: Behavior normal.    BP 128/64 (BP Location: Left Arm, Patient Position: Sitting, Cuff Size: Normal)    Pulse 84    Resp 18    Ht 5' 4" (1.626 m)    Wt 121 lb 6.4 oz (55.1 kg)    SpO2 99%    BMI 20.84 kg/m  Wt Readings from Last 3 Encounters:  05/23/21 121 lb 6.4 oz (55.1 kg)  02/17/21 123 lb 14.4 oz (56.2 kg)  02/14/21 124 lb (56.2 kg)    Lab Results  Component Value Date   TSH 0.119 (L) 01/23/2021   Lab Results  Component Value Date   WBC 10.1 02/17/2021   HGB 11.0 (L) 02/17/2021   HCT 33.2 (L) 02/17/2021   MCV 91.0 02/17/2021   PLT 240 02/17/2021   Lab Results  Component Value Date   NA 139 01/23/2021   K 4.3 01/23/2021   CO2 23 01/23/2021   GLUCOSE 104 (H) 01/23/2021   BUN 16 01/23/2021   CREATININE 0.70 01/23/2021   BILITOT 0.6 01/23/2021   ALKPHOS 89  01/23/2021   AST 18 01/23/2021   ALT 19 01/23/2021   PROT 6.6 01/23/2021   ALBUMIN 4.4 01/23/2021   CALCIUM 10.8 (H) 01/23/2021   ANIONGAP 8 04/22/2019   EGFR 87 01/23/2021   Lab Results  Component Value Date   CHOL 136 10/20/2020   Lab Results  Component Value Date   HDL 67 10/20/2020   Lab Results  Component Value Date   LDLCALC 51 10/20/2020   Lab Results  Component Value Date   TRIG 97 10/20/2020   Lab Results  Component Value Date   CHOLHDL 2.0 10/20/2020   Lab Results  Component Value Date   HGBA1C 6.7 (H) 01/23/2021      Assessment & Plan:   Problem List Items Addressed This Visit       Cardiovascular and Mediastinum   HTN (hypertension) - Primary    BP Readings from Last 1 Encounters:  05/23/21 128/64  Well-controlled with Amlodipine, Micardis HCT and Metoprolol Counseled for compliance with the medications Advised DASH diet and moderate exercise/walking as tolerated        Endocrine   Type 2 diabetes mellitus with other specified complication (HCC)    With HTN  Lab Results  Component Value Date   HGBA1C 6.7 (H) 01/23/2021    On Lantus 22 U qHS, Trulicity 7.56 mg qw and Metformin 500 mg BID - decreased Lantus to 18 U qHS due to episodes of borderline hypoglycemia in the morning Advised to follow diabetic  diet On ARB and statin F/u HbA1C, CMP and lipid panel Diabetic eye exam: Advised to follow up with Ophthalmology for diabetic eye exam      Relevant Orders   CMP14+EGFR   Hemoglobin A1c   Hypothyroidism    Lab Results  Component Value Date   TSH 0.119 (L) 01/23/2021  On Levothyroxine 100 mcg QD now Check TSH and free T4      Relevant Orders   CMP14+EGFR   TSH + free T4     Other   Iron deficiency anemia    No signs of active bleeding Check CBC      Relevant Orders   CBC   Other Visit Diagnoses     Need for pneumococcal vaccination       Relevant Orders   Pneumococcal conjugate vaccine 20-valent (Prevnar 20)  (Completed)       No orders of the defined types were placed in this encounter.   Follow-up: Return in about 4 months (around 09/22/2021) for DM and HTN.    Lindell Spar, MD

## 2021-05-23 NOTE — Patient Instructions (Addendum)
Please start taking Lantus 18 U instead of 22 U. ? ?Please continue to take other medications as prescribed. ? ?Please continue to follow low carb diet and ambulate as tolerated. ? ?Please consider getting Shingrix vaccine at your local pharmacy. ?

## 2021-05-23 NOTE — Assessment & Plan Note (Signed)
With HTN ? ?Lab Results  ?Component Value Date  ? HGBA1C 6.7 (H) 01/23/2021  ? ? ?On Lantus 22 U qHS, Trulicity 0.75 mg qw and Metformin 500 mg BID - decreased Lantus to 18 U qHS due to episodes of borderline hypoglycemia in the morning ?Advised to follow diabetic diet ?On ARB and statin ?F/u HbA1C, CMP and lipid panel ?Diabetic eye exam: Advised to follow up with Ophthalmology for diabetic eye exam ?

## 2021-05-24 ENCOUNTER — Other Ambulatory Visit: Payer: Self-pay | Admitting: Internal Medicine

## 2021-05-24 DIAGNOSIS — E039 Hypothyroidism, unspecified: Secondary | ICD-10-CM

## 2021-05-24 LAB — CMP14+EGFR
ALT: 21 IU/L (ref 0–32)
AST: 22 IU/L (ref 0–40)
Albumin/Globulin Ratio: 1.9 (ref 1.2–2.2)
Albumin: 4.4 g/dL (ref 3.6–4.6)
Alkaline Phosphatase: 107 IU/L (ref 44–121)
BUN/Creatinine Ratio: 22 (ref 12–28)
BUN: 15 mg/dL (ref 8–27)
Bilirubin Total: 0.6 mg/dL (ref 0.0–1.2)
CO2: 25 mmol/L (ref 20–29)
Calcium: 10.4 mg/dL — ABNORMAL HIGH (ref 8.7–10.3)
Chloride: 99 mmol/L (ref 96–106)
Creatinine, Ser: 0.69 mg/dL (ref 0.57–1.00)
Globulin, Total: 2.3 g/dL (ref 1.5–4.5)
Glucose: 124 mg/dL — ABNORMAL HIGH (ref 70–99)
Potassium: 4.1 mmol/L (ref 3.5–5.2)
Sodium: 136 mmol/L (ref 134–144)
Total Protein: 6.7 g/dL (ref 6.0–8.5)
eGFR: 87 mL/min/{1.73_m2} (ref >59)

## 2021-05-24 LAB — CBC
Hematocrit: 38.1 % (ref 34.0–46.6)
Hemoglobin: 12.8 g/dL (ref 11.1–15.9)
MCH: 28.3 pg (ref 26.6–33.0)
MCHC: 33.6 g/dL (ref 31.5–35.7)
MCV: 84 fL (ref 79–97)
Platelets: 277 10*3/uL (ref 150–450)
RBC: 4.53 x10E6/uL (ref 3.77–5.28)
RDW: 12.1 % (ref 11.7–15.4)
WBC: 7 10*3/uL (ref 3.4–10.8)

## 2021-05-24 LAB — HEMOGLOBIN A1C
Est. average glucose Bld gHb Est-mCnc: 154 mg/dL
Hgb A1c MFr Bld: 7 % — ABNORMAL HIGH (ref 4.8–5.6)

## 2021-05-24 LAB — TSH+FREE T4
Free T4: 2.43 ng/dL — ABNORMAL HIGH (ref 0.82–1.77)
TSH: 0.157 u[IU]/mL — ABNORMAL LOW (ref 0.450–4.500)

## 2021-05-24 MED ORDER — LEVOTHYROXINE SODIUM 88 MCG PO TABS: 88.0000 ug | ORAL_TABLET | Freq: Every day | ORAL | 1 refills | Status: DC

## 2021-05-26 ENCOUNTER — Encounter (HOSPITAL_COMMUNITY): Payer: Medicare Other

## 2021-05-30 ENCOUNTER — Encounter (HOSPITAL_COMMUNITY): Payer: Medicare Other

## 2021-06-01 ENCOUNTER — Other Ambulatory Visit: Payer: Self-pay | Admitting: Internal Medicine

## 2021-06-02 ENCOUNTER — Encounter (HOSPITAL_COMMUNITY): Payer: Medicare Other

## 2021-06-14 ENCOUNTER — Telehealth: Payer: Self-pay

## 2021-06-14 ENCOUNTER — Other Ambulatory Visit: Payer: Self-pay | Admitting: *Deleted

## 2021-06-14 DIAGNOSIS — Z794 Long term (current) use of insulin: Secondary | ICD-10-CM

## 2021-06-14 MED ORDER — METFORMIN HCL 500 MG PO TABS: 500.0000 mg | ORAL_TABLET | Freq: Two times a day (BID) | ORAL | 0 refills | Status: DC

## 2021-06-14 MED ORDER — FREESTYLE LITE TEST VI STRP: ORAL_STRIP | 3 refills | Status: DC

## 2021-06-14 MED ORDER — INSULIN GLARGINE 100 UNIT/ML ~~LOC~~ SOLN: 22.0000 [IU] | Freq: Every day | SUBCUTANEOUS | 1 refills | Status: DC

## 2021-06-14 MED ORDER — TELMISARTAN-HCTZ 40-12.5 MG PO TABS: 1.0000 | ORAL_TABLET | Freq: Every day | ORAL | 0 refills | Status: DC

## 2021-06-14 NOTE — Telephone Encounter (Signed)
Patient came  the office need med refills ? ?FREESTYLE LITE test strip  ? ?insulin glargine (LANTUS) 100 UNIT/ML injection  ? ? ?metFORMIN (GLUCOPHAGE) 500 MG ? ?metoprolol succinate (TOPROL-XL) 50 MG 24 hr tablet ? ?Micardis HCT 40-12.5 mg tablet ? ?Dulaglutide (TRULICITY) 0.75 MG/0.5ML SOPN  ? ? ? ?Pharmacy: [facility]. ?

## 2021-06-14 NOTE — Telephone Encounter (Signed)
Pt medication sent to pharmacy  

## 2021-06-21 ENCOUNTER — Other Ambulatory Visit: Payer: Self-pay | Admitting: Internal Medicine

## 2021-07-03 LAB — HM DIABETES EYE EXAM

## 2021-07-04 ENCOUNTER — Encounter: Payer: Self-pay | Admitting: *Deleted

## 2021-07-04 ENCOUNTER — Ambulatory Visit (INDEPENDENT_AMBULATORY_CARE_PROVIDER_SITE_OTHER): Payer: Medicare Other | Admitting: Family Medicine

## 2021-07-04 ENCOUNTER — Encounter: Payer: Self-pay | Admitting: Family Medicine

## 2021-07-04 VITALS — BP 114/68 | HR 98 | Ht 64.0 in | Wt 122.2 lb

## 2021-07-04 DIAGNOSIS — R300 Dysuria: Secondary | ICD-10-CM | POA: Diagnosis not present

## 2021-07-04 DIAGNOSIS — N3090 Cystitis, unspecified without hematuria: Secondary | ICD-10-CM

## 2021-07-04 LAB — POCT URINALYSIS DIP (CLINITEK)
Glucose, UA: NEGATIVE mg/dL
Nitrite, UA: NEGATIVE
POC PROTEIN,UA: 30 — AB
Spec Grav, UA: 1.025 (ref 1.010–1.025)
Urobilinogen, UA: 1 E.U./dL
pH, UA: 5.5 (ref 5.0–8.0)

## 2021-07-04 MED ORDER — PHENAZOPYRIDINE HCL 100 MG PO TABS: 100.0000 mg | ORAL_TABLET | Freq: Three times a day (TID) | ORAL | 0 refills | Status: AC | PRN

## 2021-07-04 MED ORDER — SULFAMETHOXAZOLE-TRIMETHOPRIM 800-160 MG PO TABS: 1.0000 | ORAL_TABLET | Freq: Two times a day (BID) | ORAL | 0 refills | Status: AC

## 2021-07-04 NOTE — Assessment & Plan Note (Signed)
-  Urine dipstick is positive for leukocytes but neg for nitrates ?-Treating empirically since pt is symptomatic ?-Advised to complete the full course of treatments ?-Bactrim and Pyridium ordered ?-Educated that Pyridium can cause an orange discoloration of the urine and stain undergarments ?-Advice to wear panty liners while taking Pyridium. ?-Pending culture ?

## 2021-07-04 NOTE — Progress Notes (Signed)
? ?Acute Office Visit ? ?Subjective:  ? ?  ?Patient ID: [name], female    DOB: 1940/06/19, 81 y.o.   MRN: [id number] ? ?Chief Complaint  ?Patient presents with  ? Urinary Tract Infection  ?  Complains of pelvic pain, hurts to walk sx started yesterday around noon.  ? ? ?HPI ?Lisa Beck is a 81 y.o. female with past medical history of hypertension, DM, hypothyroidism, HLD and macular pucker of left eye who presents with c/o of urinary symptoms that started on 07/03/2021--the patient complained of abdominal pain, urgency, frequency, dysuria, and burning with urination. The patient denies headaches, fever, N/V, urinary odor, and flank pain.  ? ?Review of Systems  ?Constitutional:  Negative for chills, diaphoresis and fever.  ?HENT:  Negative for congestion, nosebleeds and sinus pain.   ?Eyes:  Negative for blurred vision, double vision, photophobia and pain.  ?Respiratory:  Negative for shortness of breath and wheezing.   ?Cardiovascular:  Negative for chest pain and palpitations.  ?Gastrointestinal:  Negative for constipation, diarrhea, nausea and vomiting.  ?Genitourinary:  Positive for dysuria, frequency and urgency. Negative for flank pain.  ?Musculoskeletal:  Negative for back pain.  ?Neurological:  Negative for dizziness, weakness and headaches.  ?Endo/Heme/Allergies:  Negative for polydipsia.  ?Psychiatric/Behavioral:  Negative for hallucinations.   ? ? ?   ?Objective:  ?  ?BP 114/68 (BP Location: Left Arm, Patient Position: Sitting)   Pulse 98   Ht 5\' 4"  (1.626 m)   Wt 122 lb 3.2 oz (55.4 kg)   SpO2 98%   BMI 20.98 kg/m?  ? ? ?Physical Exam ?Constitutional:   ?   General: She is in acute distress.  ?HENT:  ?   Head: Normocephalic.  ?   Right Ear: External ear normal.  ?   Left Ear: External ear normal.  ?   Nose: Nose normal.  ?   Mouth/Throat:  ?   Mouth: Mucous membranes are moist.  ?Cardiovascular:  ?   Rate and Rhythm: Normal rate and regular rhythm.  ?   Pulses: Normal pulses.  ?   Heart  sounds: Normal heart sounds.  ?Pulmonary:  ?   Effort: Pulmonary effort is normal. No respiratory distress.  ?   Breath sounds: No wheezing.  ?Abdominal:  ?   General: Bowel sounds are normal.  ?   Tenderness: There is abdominal tenderness in the suprapubic area. There is no right CVA tenderness or left CVA tenderness.  ?Musculoskeletal:  ?   Cervical back: Normal range of motion.  ?Neurological:  ?   Mental Status: She is alert.  ? ? ?Results for orders placed or performed in visit on 07/04/21  ?HM DIABETES EYE EXAM  ?Result Value Ref Range  ? HM Diabetic Eye Exam No Retinopathy No Retinopathy  ?Results for orders placed or performed in visit on 07/04/21  ?POCT URINALYSIS DIP (CLINITEK)  ?Result Value Ref Range  ? Color, UA straw (A) yellow  ? Clarity, UA cloudy (A) clear  ? Glucose, UA negative negative mg/dL  ? Bilirubin, UA small (A) negative  ? Ketones, POC UA small (15) (A) negative mg/dL  ? Spec Grav, UA 1.025 1.010 - 1.025  ? Blood, UA large (A) negative  ? pH, UA 5.5 5.0 - 8.0  ? POC PROTEIN,UA =30 (A) negative, trace  ? Urobilinogen, UA 1.0 0.2 or 1.0 E.U./dL  ? Nitrite, UA Negative Negative  ? Leukocytes, UA Large (3+) (A) Negative  ? ? ? ?   ?  Assessment & Plan:  ? ?Problem List Items Addressed This Visit   ? ?  ? Genitourinary  ? Cystitis  ?  -Urine dipstick is positive for leukocytes but neg for nitrates ?-Treating empirically since pt is symptomatic ?-Advised to complete the full course of treatments ?-Bactrim and Pyridium ordered ?-Educated that Pyridium can cause an orange discoloration of the urine and stain undergarments ?-Advice to wear panty liners while taking Pyridium. ?-Pending culture ? ?  ?  ? Relevant Medications  ? sulfamethoxazole-trimethoprim (BACTRIM DS) 800-160 MG tablet  ? phenazopyridine (PYRIDIUM) 100 MG tablet  ? ?Other Visit Diagnoses   ? ? Burning with urination    -  Primary  ? Relevant Orders  ? POCT URINALYSIS DIP (CLINITEK) (Completed)  ? Urine Culture  ? ?  ? ? ?Meds ordered  this encounter  ?Medications  ? sulfamethoxazole-trimethoprim (BACTRIM DS) 800-160 MG tablet  ?  Sig: Take 1 tablet by mouth 2 (two) times daily for 7 days.  ?  Dispense:  14 tablet  ?  Refill:  0  ? phenazopyridine (PYRIDIUM) 100 MG tablet  ?  Sig: Take 1 tablet (100 mg total) by mouth 3 (three) times daily as needed for up to 2 days for pain.  ?  Dispense:  6 tablet  ?  Refill:  0  ? ? ?No follow-ups on file. ? ?Gilmore Laroche, FNP ? ? ?

## 2021-07-04 NOTE — Patient Instructions (Addendum)
I appreciate the opportunity to provide care to you today! ?  ?Please pick up your antibiotic ( bactrim) for UTI ?Please complete the full course of treatments of the antibiotics ? ?I've ordered pyridium ( for 2 days treatment) to help with the pain you are experiencing with urination. ?Pyridium can cause the urine to turn orange and stain undergarments. ? ?Please wear a panty liner while taking this medication. ? ?  ?It was a pleasure to see you and I look forward to continuing to work together on your health and well-being. ?Please do not hesitate to call the office if you need care or have questions about your care. ?  ?Have a wonderful day and week. ?With Gratitude, ?Gilmore Laroche MSN, FNP-BC ? ?

## 2021-07-07 LAB — URINE CULTURE

## 2021-08-01 ENCOUNTER — Telehealth: Payer: Self-pay

## 2021-08-01 ENCOUNTER — Other Ambulatory Visit: Payer: Self-pay

## 2021-08-01 ENCOUNTER — Ambulatory Visit (INDEPENDENT_AMBULATORY_CARE_PROVIDER_SITE_OTHER): Payer: Medicare Other | Admitting: Family Medicine

## 2021-08-01 ENCOUNTER — Encounter: Payer: Self-pay | Admitting: Family Medicine

## 2021-08-01 DIAGNOSIS — N3001 Acute cystitis with hematuria: Secondary | ICD-10-CM

## 2021-08-01 LAB — POCT URINALYSIS DIP (CLINITEK)
Bilirubin, UA: NEGATIVE
Glucose, UA: NEGATIVE mg/dL
Ketones, POC UA: NEGATIVE mg/dL
Nitrite, UA: NEGATIVE
POC PROTEIN,UA: NEGATIVE
Spec Grav, UA: 1.02 (ref 1.010–1.025)
Urobilinogen, UA: 1 E.U./dL
pH, UA: 7 (ref 5.0–8.0)

## 2021-08-01 MED ORDER — NITROFURANTOIN MONOHYD MACRO 100 MG PO CAPS: 100.0000 mg | ORAL_CAPSULE | Freq: Two times a day (BID) | ORAL | 0 refills | Status: DC

## 2021-08-01 NOTE — Patient Instructions (Signed)
F/U with Dr Allena Katz as  before, call if you need to be seen sooner ? ?You appear to have a bladder infection and as you report you were treated for one approximately one month ago ? ?Ensure you drink 64 ounces  water daily and void often ? ?1 week of antibiotics is prescribed , ensure you take entioe course please ? ? ?

## 2021-08-01 NOTE — Progress Notes (Signed)
Virtual Visit via Telephone Note ? ?I connected with Lisa Beck on 08/01/21 at 11:40 AM EDT by telephone and verified that I am speaking with the correct person using two identifiers. ? ?Location: ?Patient: home ?Provider: office ?  ?I discussed the limitations, risks, security and privacy concerns of performing an evaluation and management service by telephone and the availability of in person appointments. I also discussed with the patient that there may be a patient responsible charge related to this service. The patient expressed understanding and agreed to proceed. ? ? ?History of Present Illness: ? ? 2 day h/o burning and frequency of urination, no fever, chills or flank pain, no nausea ?Was diagnosed with and treated for uTI approximately 1 month ago ?Observations/Objective: ?There were no vitals taken for this visit. ?Good communication with no confusion and intact memory. ?Alert and oriented x 3 ?No signs of respiratory distress during speech ? ? ?Assessment and Plan: ? ?Acute cystitis with hematuria ?2 day history, had documented E coli 1 month ago, pan sensitive , treat presumptovely and f/u c/s and pCP for follow up in office ? ?Follow Up Instructions: ? ?  ?I discussed the assessment and treatment plan with the patient. The patient was provided an opportunity to ask questions and all were answered. The patient agreed with the plan and demonstrated an understanding of the instructions. ?  ?The patient was advised to call back or seek an in-person evaluation if the symptoms worsen or if the condition fails to improve as anticipated. ? ?I provided 12 minutes of non-face-to-face time during this encounter. ? ? ?Syliva Overman, MD ? ?

## 2021-08-01 NOTE — Telephone Encounter (Signed)
Patient spouse called pharmacy has not yet received this medicine ?nitrofurantoin, macrocrystal-monohydrate, (MACROBID) 100 MG capsule ? ?Pharmacy: [address] ?

## 2021-08-01 NOTE — Assessment & Plan Note (Signed)
2 day history, had documented E coli 1 month ago, pan sensitive , treat presumptovely and f/u c/s and pCP for follow up in office ?

## 2021-08-01 NOTE — Telephone Encounter (Signed)
Resent med to [facility]  ?

## 2021-08-07 ENCOUNTER — Other Ambulatory Visit: Payer: Self-pay | Admitting: Family Medicine

## 2021-08-07 LAB — URINE CULTURE

## 2021-08-07 MED ORDER — SULFAMETHOXAZOLE-TRIMETHOPRIM 800-160 MG PO TABS: 1.0000 | ORAL_TABLET | Freq: Two times a day (BID) | ORAL | 0 refills | Status: DC

## 2021-08-09 ENCOUNTER — Encounter: Payer: Self-pay | Admitting: Orthopedic Surgery

## 2021-08-09 ENCOUNTER — Ambulatory Visit (INDEPENDENT_AMBULATORY_CARE_PROVIDER_SITE_OTHER): Payer: Medicare Other | Admitting: Orthopedic Surgery

## 2021-08-09 ENCOUNTER — Ambulatory Visit (INDEPENDENT_AMBULATORY_CARE_PROVIDER_SITE_OTHER): Payer: Medicare Other

## 2021-08-09 DIAGNOSIS — Z96611 Presence of right artificial shoulder joint: Secondary | ICD-10-CM

## 2021-08-09 NOTE — Progress Notes (Signed)
Orthopaedic Postop Note  Assessment: Lisa Beck is a 81 y.o. female s/p Right Reverse Shoulder Arthroplasty  DOS: 02/16/2021  Plan: Radiographs are stable. No pain in the right shoulder. Full released all activities. Can see gradual improvements in range of motion and strength for up to a year following surgery. Call with issues. Follow-up in 6 months, around the 1 year anniversary from surgery.  Follow-up: Return in about 6 months (around 02/09/2022).  XR at next visit: Right shoulder  Subjective:  Chief Complaint  Patient presents with   s/p reverse arthroplasty of right shoulder    RT shoulder 3 mth f/u  DOS 02/16/21    History of Present Illness: Lisa Beck is a 81 y.o. female who presents following the above stated procedure.  Surgery was 6 months ago.  She is not having any pain in her shoulder.  Her function is good.  She has some difficulty reaching behind her back, but otherwise is able to do everything that she wants to do.  Occasional pain in her shoulder, but she is not taking any medicines.  Review of Systems: No fevers or chills No numbness or tingling No Chest Pain No shortness of breath   Objective: There were no vitals taken for this visit.  Physical Exam:  Alert and oriented.  No acute distress.  Surgical incision is well-healed.  No surrounding erythema or drainage.  No tenderness to palpation.  140 degrees of forward elevation.  100 degrees of abduction at her side.  Internal rotation to her low back.  Fingers are warm and well-perfused.  2+ radial pulse.  Sensation is intact throughout the right upper extremity.  IMAGING: I personally ordered and reviewed the following images:  X-rays of the right shoulder were obtained in clinic today.  Well-positioned reverse shoulder arthroplasty, without evidence of subsidence.  The glenohumeral joint is reduced.  There is no lucency around the prostheses.    Impression: Right reverse shoulder  arthroplasty in stable position.   Oliver Barre, MD 08/09/2021 9:41 AM

## 2021-08-22 ENCOUNTER — Other Ambulatory Visit: Payer: Self-pay | Admitting: Internal Medicine

## 2021-08-25 ENCOUNTER — Other Ambulatory Visit: Payer: Self-pay | Admitting: Internal Medicine

## 2021-09-04 ENCOUNTER — Encounter (INDEPENDENT_AMBULATORY_CARE_PROVIDER_SITE_OTHER): Payer: Self-pay | Admitting: Ophthalmology

## 2021-09-04 ENCOUNTER — Ambulatory Visit (INDEPENDENT_AMBULATORY_CARE_PROVIDER_SITE_OTHER): Payer: Medicare Other | Admitting: Ophthalmology

## 2021-09-04 DIAGNOSIS — H33102 Unspecified retinoschisis, left eye: Secondary | ICD-10-CM

## 2021-09-04 DIAGNOSIS — Z961 Presence of intraocular lens: Secondary | ICD-10-CM | POA: Diagnosis not present

## 2021-09-04 DIAGNOSIS — H35372 Puckering of macula, left eye: Secondary | ICD-10-CM | POA: Diagnosis not present

## 2021-09-04 NOTE — Progress Notes (Signed)
09/04/2021     CHIEF COMPLAINT Patient presents for  Chief Complaint  Patient presents with   Retina Follow Up      HISTORY OF PRESENT ILLNESS: Lisa Beck is a 81 y.o. female who presents to the clinic today for:   HPI     Retina Follow Up           Diagnosis: Macular Pucker   Laterality: left eye   Onset: 1 year ago         Comments   1 yr fu OU OCT. Patient states vision is stable and unchanged since last visit. Denies any new floaters or FOL.       Last edited by Nelva Nay on 09/04/2021 12:47 PM.      Referring physician: Smitty Cords, OD [address] [place],  [place] [address]  HISTORICAL INFORMATION:   Selected notes from the MEDICAL RECORD NUMBER    Lab Results  Component Value Date   HGBA1C 7.0 (H) 05/23/2021     CURRENT MEDICATIONS: No current outpatient medications on file. (Ophthalmic Drugs)   No current facility-administered medications for this visit. (Ophthalmic Drugs)   Current Outpatient Medications (Other)  Medication Sig   amLODipine (NORVASC) 5 MG tablet TAKE 1 TABLET DAILY   aspirin EC 81 MG tablet Take 81 mg by mouth daily.    atorvastatin (LIPITOR) 40 MG tablet Take 40 mg by mouth every evening.   carbamide peroxide (DEBROX) 6.5 % OTIC solution Place 5 drops into both ears 2 (two) times daily.   glucose blood (FREESTYLE LITE) test strip USE TO TEST BLOOD SUGAR DAILY   insulin glargine (LANTUS) 100 UNIT/ML injection Inject 0.22 mLs (22 Units total) into the skin at bedtime.   levothyroxine (SYNTHROID) 88 MCG tablet Take 1 tablet (88 mcg total) by mouth daily before breakfast.   metFORMIN (GLUCOPHAGE) 500 MG tablet TAKE 1 TABLET TWICE A DAY   metoprolol succinate (TOPROL-XL) 50 MG 24 hr tablet TAKE 1 TABLET DAILY   MICARDIS HCT 40-12.5 MG tablet TAKE 1 TABLET DAILY   nitrofurantoin, macrocrystal-monohydrate, (MACROBID) 100 MG capsule Take 1 capsule (100 mg total) by mouth 2 (two) times daily.   TRULICITY 0.75 MG/0.5ML  SOPN INJECT 0.75 MG UNDER THE SKIN EVERY WEEK   VITAMIN D PO Take by mouth daily.   No current facility-administered medications for this visit. (Other)      REVIEW OF SYSTEMS: ROS   Negative for: Constitutional, Gastrointestinal, Neurological, Skin, Genitourinary, Musculoskeletal, HENT, Endocrine, Cardiovascular, Eyes, Respiratory, Psychiatric, Allergic/Imm, Heme/Lymph Last edited by Edmon Crape, MD on 09/04/2021  1:43 PM.       ALLERGIES No Known Allergies  PAST MEDICAL HISTORY Past Medical History:  Diagnosis Date   Arthritis    Diabetes mellitus without complication (HCC)    History of kidney stones    HOH (hard of hearing)    Hyperlipidemia    Hypertension    Hypothyroidism    Past Surgical History:  Procedure Laterality Date   COLONOSCOPY  2007   Dr. Karilyn Cota: diverticulosis   COLONOSCOPY WITH PROPOFOL N/A 04/24/2019   Dr. Jena Gauss: Diverticulosis in the sigmoid colon, two 5 to 9 mm polyps removed from the descending and ascending colon.  Pathology revealed both to be benign polyps with no adenomatous changes. No future surveillance colonoscopies due to age.   ESOPHAGOGASTRODUODENOSCOPY (EGD) WITH PROPOFOL N/A 04/24/2019   Dr. Jena Gauss: Small hiatal hernia, one gastric polyp benign.   POLYPECTOMY  04/24/2019  Procedure: POLYPECTOMY;  Surgeon: Corbin Ade, MD;  Location: [facility];  Service: Endoscopy;;  ascending;   REVERSE SHOULDER ARTHROPLASTY Right 02/16/2021   Procedure: REVERSE SHOULDER ARTHROPLASTY;  Surgeon: Oliver Barre, MD;  Location: [facility];  Service: Orthopedics;  Laterality: Right;    FAMILY HISTORY Family History  Problem Relation Age of Onset   Breast cancer Mother    CAD Father        MI sometime in 63, age unknown   CAD Brother        Pt has 3 brothers that died of MIs between ages 80s-70s   CAD Brother    CAD Brother    Colon cancer Neg Hx    Ulcers Neg Hx     SOCIAL HISTORY Social History   Tobacco Use   Smoking status:  Never   Smokeless tobacco: Never  Vaping Use   Vaping Use: Never used  Substance Use Topics   Alcohol use: No   Drug use: No         OPHTHALMIC EXAM:  Base Eye Exam     Visual Acuity (ETDRS)       Right Left   Dist cc 20/20 20/20    Correction: Glasses         Tonometry (Tonopen, 12:49 PM)       Right Left   Pressure 17 14         Pupils       Pupils Dark Light APD   Right PERRL 4 3 None   Left PERRL 4 3 None         Visual Fields (Counting fingers)       Left Right    Full Full         Extraocular Movement       Right Left    Full Full         Neuro/Psych     Oriented x3: Yes   Mood/Affect: Normal         Dilation     Both eyes: 1.0% Mydriacyl, 2.5% Phenylephrine @ 12:49 PM           Slit Lamp and Fundus Exam     External Exam       Right Left   External Normal Normal         Slit Lamp Exam       Right Left   Lids/Lashes Normal Normal   Conjunctiva/Sclera White and quiet White and quiet   Cornea Clear Clear   Anterior Chamber Deep and quiet Deep and quiet   Iris Round and reactive Round and reactive   Lens Centered posterior chamber intraocular lens, Open posterior capsule Centered posterior chamber intraocular lens, Open posterior capsule   Anterior Vitreous Normal Normal         Fundus Exam       Right Left   Posterior Vitreous Posterior vitreous detachment Posterior vitreous detachment   Disc Normal Normal   C/D Ratio 0.3 0.4   Macula Normal Epiretinal membrane, moderate topographic distortion   Vessels no DR no DR   Periphery Normal Normal            IMAGING AND PROCEDURES  Imaging and Procedures for 09/04/21  OCT, Retina - OU - Both Eyes       Right Eye Quality was good. Scan locations included subfoveal. Central Foveal Thickness: 304. Progression has been stable. Findings include normal foveal contour.   Left Eye Quality was good.  Scan locations included subfoveal. Central Foveal  Thickness: 354. Progression has been stable. Findings include abnormal foveal contour, epiretinal membrane.   Notes No interval change, with minor epiretinal membrane left eye, year over year , now with slightly more tangential traction and attenuation in the foveal region and perifoveal CME.              ASSESSMENT/PLAN:  Macular pucker, left eye Continued attenuation center of the fovea.  We will need to continue to monitor annually.  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 Macular pucker, left eye  H35.372 OCT, Retina - OU - Both Eyes    2. 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 Pseudophakia of both eyes  Z96.1       1.  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  Patient to notify the office promptly if new onset visual acuity decline or distortion  3.  Ophthalmic Meds Ordered this visit:  No orders of the defined types were placed in this encounter.      Return in about 1 year (around 09/05/2022) for DILATE OU, OCT.  There are no Patient Instructions on file for this visit.   Explained the diagnoses, plan, and follow up with the patient and they expressed understanding.  Patient expressed understanding of the importance of proper follow up care.   Alford Highland Patsey Pitstick M.D. Diseases & Surgery of the Retina and Vitreous [facility] 09/04/21     Abbreviations: M myopia (nearsighted); A astigmatism; H hyperopia (farsighted); P presbyopia; Mrx spectacle prescription;  CTL contact lenses; OD right eye; OS left eye; OU both eyes  XT exotropia; ET esotropia; PEK punctate epithelial keratitis; PEE punctate epithelial erosions; DES dry eye syndrome; MGD meibomian gland dysfunction; ATs artificial tears; PFAT's preservative free artificial tears; NSC nuclear sclerotic cataract; PSC  posterior subcapsular cataract; ERM epi-retinal membrane; PVD posterior vitreous detachment; RD retinal detachment; DM diabetes mellitus; DR diabetic retinopathy; NPDR non-proliferative diabetic retinopathy; PDR proliferative diabetic retinopathy; CSME clinically significant macular edema; DME diabetic macular edema; dbh dot blot hemorrhages; CWS cotton wool spot; POAG primary open angle glaucoma; C/D cup-to-disc ratio; HVF humphrey visual field; GVF goldmann visual field; OCT optical coherence tomography; IOP intraocular pressure; BRVO Branch retinal vein occlusion; CRVO central retinal vein occlusion; CRAO central retinal artery occlusion; BRAO branch retinal artery occlusion; RT retinal tear; SB scleral buckle; PPV pars plana vitrectomy; VH Vitreous hemorrhage; PRP panretinal laser photocoagulation; IVK intravitreal kenalog; VMT vitreomacular traction; MH Macular hole;  NVD neovascularization of the disc; NVE neovascularization elsewhere; AREDS age related eye disease study; ARMD age related macular degeneration; POAG primary open angle glaucoma; EBMD epithelial/anterior basement membrane dystrophy; ACIOL anterior chamber intraocular lens; IOL intraocular lens; PCIOL posterior chamber intraocular lens; Phaco/IOL phacoemulsification with intraocular lens placement; PRK photorefractive keratectomy; LASIK laser assisted in situ keratomileusis; HTN hypertension; DM diabetes mellitus; COPD chronic obstructive pulmonary disease

## 2021-09-04 NOTE — Assessment & Plan Note (Signed)
Continued attenuation center of the fovea.  We will need to continue to monitor annually.

## 2021-09-04 NOTE — Assessment & Plan Note (Signed)
Secondary to ERM yet with good acuity

## 2021-09-04 NOTE — Assessment & Plan Note (Signed)
OU looks great. 

## 2021-09-22 ENCOUNTER — Ambulatory Visit: Payer: Medicare Other | Admitting: Internal Medicine

## 2021-09-22 ENCOUNTER — Emergency Department (HOSPITAL_COMMUNITY)
Admission: EM | Admit: 2021-09-22 | Discharge: 2021-09-22 | Disposition: A | Discharge status: Home / Self Care | Payer: Medicare Other | Attending: Emergency Medicine | Admitting: Emergency Medicine

## 2021-09-22 ENCOUNTER — Other Ambulatory Visit: Payer: Self-pay

## 2021-09-22 ENCOUNTER — Encounter (HOSPITAL_COMMUNITY): Payer: Self-pay

## 2021-09-22 ENCOUNTER — Emergency Department (HOSPITAL_COMMUNITY): Payer: Medicare Other

## 2021-09-22 DIAGNOSIS — Z79899 Other long term (current) drug therapy: Secondary | ICD-10-CM | POA: Insufficient documentation

## 2021-09-22 DIAGNOSIS — Z7982 Long term (current) use of aspirin: Secondary | ICD-10-CM | POA: Diagnosis not present

## 2021-09-22 DIAGNOSIS — Z7984 Long term (current) use of oral hypoglycemic drugs: Secondary | ICD-10-CM | POA: Diagnosis not present

## 2021-09-22 DIAGNOSIS — N132 Hydronephrosis with renal and ureteral calculous obstruction: Secondary | ICD-10-CM | POA: Insufficient documentation

## 2021-09-22 DIAGNOSIS — E119 Type 2 diabetes mellitus without complications: Secondary | ICD-10-CM | POA: Diagnosis not present

## 2021-09-22 DIAGNOSIS — Z794 Long term (current) use of insulin: Secondary | ICD-10-CM | POA: Insufficient documentation

## 2021-09-22 DIAGNOSIS — R110 Nausea: Secondary | ICD-10-CM | POA: Diagnosis not present

## 2021-09-22 DIAGNOSIS — R109 Unspecified abdominal pain: Secondary | ICD-10-CM | POA: Diagnosis present

## 2021-09-22 DIAGNOSIS — N200 Calculus of kidney: Secondary | ICD-10-CM

## 2021-09-22 DIAGNOSIS — I10 Essential (primary) hypertension: Secondary | ICD-10-CM | POA: Diagnosis not present

## 2021-09-22 LAB — URINALYSIS, ROUTINE W REFLEX MICROSCOPIC
Bacteria, UA: NONE SEEN
Bilirubin Urine: NEGATIVE
Glucose, UA: NEGATIVE mg/dL
Ketones, ur: NEGATIVE mg/dL
Nitrite: NEGATIVE
Protein, ur: NEGATIVE mg/dL
Specific Gravity, Urine: 1.016 (ref 1.005–1.030)
pH: 7 (ref 5.0–8.0)

## 2021-09-22 LAB — BASIC METABOLIC PANEL
Anion gap: 8 (ref 5–15)
BUN: 19 mg/dL (ref 8–23)
CO2: 26 mmol/L (ref 22–32)
Calcium: 10.4 mg/dL — ABNORMAL HIGH (ref 8.9–10.3)
Chloride: 102 mmol/L (ref 98–111)
Creatinine, Ser: 0.86 mg/dL (ref 0.44–1.00)
GFR, Estimated: >60 mL/min (ref >60)
Glucose, Bld: 140 mg/dL — ABNORMAL HIGH (ref 70–99)
Potassium: 3.5 mmol/L (ref 3.5–5.1)
Sodium: 136 mmol/L (ref 135–145)

## 2021-09-22 LAB — CBC
HCT: 37.9 % (ref 36.0–46.0)
Hemoglobin: 12.3 g/dL (ref 12.0–15.0)
MCH: 28.5 pg (ref 26.0–34.0)
MCHC: 32.5 g/dL (ref 30.0–36.0)
MCV: 87.9 fL (ref 80.0–100.0)
Platelets: 306 10*3/uL (ref 150–400)
RBC: 4.31 MIL/uL (ref 3.87–5.11)
RDW: 13.5 % (ref 11.5–15.5)
WBC: 10.1 10*3/uL (ref 4.0–10.5)
nRBC: 0 % (ref 0.0–0.2)

## 2021-09-22 MED ORDER — ONDANSETRON HCL 4 MG/2ML IJ SOLN
4.0000 mg | Freq: Once | INTRAMUSCULAR | Status: AC
  Administered 2021-09-22: 4 mg via INTRAVENOUS
  Filled 2021-09-22: qty 2

## 2021-09-22 MED ORDER — MORPHINE SULFATE (PF) 4 MG/ML IV SOLN
4.0000 mg | Freq: Once | INTRAVENOUS | Status: AC
  Administered 2021-09-22: 4 mg via INTRAVENOUS
  Filled 2021-09-22: qty 1

## 2021-09-22 MED ORDER — HYDROCODONE-ACETAMINOPHEN 5-325 MG PO TABS: 1.0000 | ORAL_TABLET | Freq: Four times a day (QID) | ORAL | 0 refills | Status: DC | PRN

## 2021-09-22 MED ORDER — ONDANSETRON 8 MG PO TBDP
ORAL_TABLET | ORAL | 0 refills | Status: AC
Start: 2021-09-22 — End: ?

## 2021-09-22 NOTE — ED Provider Notes (Signed)
[facility] EMERGENCY DEPARTMENT Provider Note   CSN: [id number] Arrival date & time: 09/22/21  0104     History  Chief Complaint  Patient presents with   Flank Pain    Lisa Beck is a 81 y.o. female.  Patient is an 81 year old female with past medical history of hypertension, hyperlipidemia, type 2 diabetes.  Patient presenting today with complaints of left flank pain.  This woke her from sleep at approximately 11 PM.  She describes a sharp, crampy pain to the left flank with no radiation.  She feels nauseated, but has not vomited.  She denies any bowel or bladder complaints.  There are no aggravating or alleviating factors.  The history is provided by the patient.       Home Medications Prior to Admission medications   Medication Sig Start Date End Date Taking? Authorizing Provider  amLODipine (NORVASC) 5 MG tablet TAKE 1 TABLET DAILY 06/01/21   Anabel Halon, MD  aspirin EC 81 MG tablet Take 81 mg by mouth daily.     [provider]  atorvastatin (LIPITOR) 40 MG tablet Take 40 mg by mouth every evening.    [provider]  carbamide peroxide (DEBROX) 6.5 % OTIC solution Place 5 drops into both ears 2 (two) times daily. 12/01/20   Anabel Halon, MD  glucose blood (FREESTYLE LITE) test strip USE TO TEST BLOOD SUGAR DAILY 06/14/21   Anabel Halon, MD  insulin glargine (LANTUS) 100 UNIT/ML injection Inject 0.22 mLs (22 Units total) into the skin at bedtime. 06/14/21   Anabel Halon, MD  levothyroxine (SYNTHROID) 88 MCG tablet Take 1 tablet (88 mcg total) by mouth daily before breakfast. 05/24/21   Anabel Halon, MD  metFORMIN (GLUCOPHAGE) 500 MG tablet TAKE 1 TABLET TWICE A DAY 08/23/21   Anabel Halon, MD  metoprolol succinate (TOPROL-XL) 50 MG 24 hr tablet TAKE 1 TABLET DAILY 08/25/21   Anabel Halon, MD  MICARDIS HCT 40-12.5 MG tablet TAKE 1 TABLET DAILY 08/23/21   Anabel Halon, MD  nitrofurantoin, macrocrystal-monohydrate, (MACROBID) 100 MG capsule  Take 1 capsule (100 mg total) by mouth 2 (two) times daily. 08/01/21   Kerri Perches, MD  TRULICITY 0.75 MG/0.5ML SOPN INJECT 0.75 MG UNDER THE SKIN EVERY WEEK 06/22/21   Anabel Halon, MD  VITAMIN D PO Take by mouth daily.    [provider]      Allergies    Patient has no known allergies.    Review of Systems   Review of Systems  All other systems reviewed and are negative.   Physical Exam Updated Vital Signs BP (!) 162/72 (BP Location: Right Arm)   Pulse 85   Temp 97.8 F (36.6 C) (Oral)   Resp 18   Ht 5\' 4"  (1.626 m)   Wt 56.7 kg   SpO2 100%   BMI 21.46 kg/m  Physical Exam Vitals and nursing note reviewed.  Constitutional:      General: She is not in acute distress.    Appearance: She is well-developed. She is not diaphoretic.  HENT:     Head: Normocephalic and atraumatic.  Cardiovascular:     Rate and Rhythm: Normal rate and regular rhythm.     Heart sounds: No murmur heard.    No friction rub. No gallop.  Pulmonary:     Effort: Pulmonary effort is normal. No respiratory distress.     Breath sounds: Normal breath sounds. No wheezing.  Abdominal:  General: Bowel sounds are normal. There is no distension.     Palpations: Abdomen is soft.     Tenderness: There is no abdominal tenderness. There is left CVA tenderness. There is no right CVA tenderness, guarding or rebound.  Musculoskeletal:        General: Normal range of motion.     Cervical back: Normal range of motion and neck supple.  Skin:    General: Skin is warm and dry.  Neurological:     General: No focal deficit present.     Mental Status: She is alert and oriented to person, place, and time.     ED Results / Procedures / Treatments   Labs (all labs ordered are listed, but only abnormal results are displayed) Labs Reviewed  URINALYSIS, ROUTINE W REFLEX MICROSCOPIC - Abnormal; Notable for the following components:      Result Value   Hgb urine dipstick SMALL (*)    Leukocytes,Ua  TRACE (*)    All other components within normal limits  BASIC METABOLIC PANEL - Abnormal; Notable for the following components:   Glucose, Bld 140 (*)    Calcium 10.4 (*)    All other components within normal limits  CBC    EKG None  Radiology No results found.  Procedures Procedures    Medications Ordered in ED Medications  morphine (PF) 4 MG/ML injection 4 mg (has no administration in time range)  ondansetron (ZOFRAN) injection 4 mg (has no administration in time range)    ED Course/ Medical Decision Making/ A&P  This patient presents to the ED for concern of left flank pain, this involves an extensive number of treatment options, and is a complaint that carries with it a high risk of complications and morbidity.  The differential diagnosis includes pyelonephritis, kidney stone, musculoskeletal etiology   Co morbidities that complicate the patient evaluation  None   Additional history obtained:  No additional history or external records needed   Lab Tests:  I Ordered, and personally interpreted labs.  The pertinent results include: Unremarkable CBC, metabolic panel, and urinalysis   Imaging Studies ordered:  I ordered imaging studies including CT renal I independently visualized and interpreted imaging which showed fullness of the left collecting system with what appears to be a punctate stone in the urinary bladder I agree with the radiologist interpretation   Cardiac Monitoring: / EKG:  None performed   Consultations Obtained:  No consultations needed   Problem List / ED Course / Critical interventions / Medication management  CT scan shows fullness of the left renal collecting system with what may be a small stone in the urinary bladder.  Patient is feeling better after receiving pain medication.  There is no evidence for urinary tract infection.  Patient seems stable for discharge.  She will be given pain medicine and advised to follow-up with  urology as needed if not improving. I ordered medication including morphine for pain Reevaluation of the patient after these medicines showed that the patient improved I have reviewed the patients home medicines and have made adjustments as needed   Social Determinants of Health:  None   Test / Admission - Considered:  No indication for admission.  Patient's stone appears to have passed and pain is controlled.   Final Clinical Impression(s) / ED Diagnoses Final diagnoses:  None    Rx / DC Orders ED Discharge Orders     None         Geoffery Lyons, MD 09/22/21 5009

## 2021-09-22 NOTE — ED Triage Notes (Signed)
Pt brought in by spouse from home with c/o left back/flank pain that just started. Pt with hx of kidney stones, pt has nausea as well. Pt took 2 tylenol at home that "took the edge off"

## 2021-09-22 NOTE — Discharge Instructions (Addendum)
Begin taking hydrocodone as prescribed as needed for pain and Zofran as prescribed as needed for nausea.  Follow-up with urology if symptoms are not improving through the weekend.  The contact information for [facility] has been provided in this discharge summary for you to call and make these arrangements.

## 2021-10-11 ENCOUNTER — Encounter: Payer: Self-pay | Admitting: Family Medicine

## 2021-10-11 ENCOUNTER — Ambulatory Visit (INDEPENDENT_AMBULATORY_CARE_PROVIDER_SITE_OTHER): Payer: Medicare Other | Admitting: Family Medicine

## 2021-10-11 ENCOUNTER — Ambulatory Visit: Payer: Medicare Other | Admitting: Internal Medicine

## 2021-10-11 VITALS — BP 130/70 | HR 104 | Ht 64.0 in | Wt 123.4 lb

## 2021-10-11 DIAGNOSIS — E559 Vitamin D deficiency, unspecified: Secondary | ICD-10-CM

## 2021-10-11 DIAGNOSIS — E039 Hypothyroidism, unspecified: Secondary | ICD-10-CM | POA: Diagnosis not present

## 2021-10-11 DIAGNOSIS — E782 Mixed hyperlipidemia: Secondary | ICD-10-CM | POA: Diagnosis not present

## 2021-10-11 DIAGNOSIS — Z794 Long term (current) use of insulin: Secondary | ICD-10-CM

## 2021-10-11 DIAGNOSIS — I10 Essential (primary) hypertension: Secondary | ICD-10-CM

## 2021-10-11 DIAGNOSIS — E1169 Type 2 diabetes mellitus with other specified complication: Secondary | ICD-10-CM

## 2021-10-11 MED ORDER — LEVOTHYROXINE SODIUM 88 MCG PO TABS: 88.0000 ug | ORAL_TABLET | Freq: Every day | ORAL | 1 refills | Status: DC

## 2021-10-11 MED ORDER — ATORVASTATIN CALCIUM 40 MG PO TABS: 40.0000 mg | ORAL_TABLET | Freq: Every evening | ORAL | 1 refills | Status: DC

## 2021-10-11 NOTE — Assessment & Plan Note (Signed)
Controlled Compliant with medication regimens No headaches, dizziness or blurred vision

## 2021-10-11 NOTE — Assessment & Plan Note (Addendum)
Compliant with medication regimen She takes Synthroid 88 mcg daily Refilled medication

## 2021-10-11 NOTE — Assessment & Plan Note (Signed)
last HgA1c is 7.0 Reports checking blood sugar every morning Highest:134 and lowest: 74 .  Compliant with medication regimen Denies 3ps of diabetes No changes to medication regimen

## 2021-10-11 NOTE — Progress Notes (Signed)
Established Patient Office Visit  Subjective:  Patient ID: Lisa Beck, female    DOB: 09-30-1940  Age: 81 y.o. MRN: [id number]  CC:  Chief Complaint  Patient presents with   Follow-up    4 month follow up     HPI Lisa Beck is a 81 y.o. female with past medical history of HTN, Hypothyroidism presents for f/u of  chronic medical conditions. HTN: controlled. Compliant with medication regimens. No headaches, dizziness or blurred vision T2DM: last HgA1c is 7.0. Reports checking blood sugar every morning. Highest:134 and lowest: 74 . Compliant with medication regimen. Denies 3ps of diabetes HLD: compliant with Lipitor 40 mg. No concerns or complaint Hypothyroidism: compliant with medication regimen. She takes Synthroid 88 mcg daily.   Past Medical History:  Diagnosis Date   Arthritis    Diabetes mellitus without complication ([place])    History of kidney stones    HOH (hard of hearing)    Hyperlipidemia    Hypertension    Hypothyroidism     Past Surgical History:  Procedure Laterality Date   COLONOSCOPY  2007   Dr. Laural Golden: diverticulosis   COLONOSCOPY WITH PROPOFOL N/A 04/24/2019   Dr. Gala Romney: Diverticulosis in the sigmoid colon, two 5 to 9 mm polyps removed from the descending and ascending colon.  Pathology revealed both to be benign polyps with no adenomatous changes. No future surveillance colonoscopies due to age.   ESOPHAGOGASTRODUODENOSCOPY (EGD) WITH PROPOFOL N/A 04/24/2019   Dr. Gala Romney: Small hiatal hernia, one gastric polyp benign.   POLYPECTOMY  04/24/2019   Procedure: POLYPECTOMY;  Surgeon: Daneil Dolin, MD;  [facility];  Service: Endoscopy;;  ascending;   REVERSE SHOULDER ARTHROPLASTY Right 02/16/2021   Procedure: REVERSE SHOULDER ARTHROPLASTY;  Surgeon: Mordecai Rasmussen, MD;  Location: [facility];  Service: Orthopedics;  Laterality: Right;    Family History  Problem Relation Age of Onset   Breast cancer Mother    CAD Father        MI sometime in  54, age unknown   CAD Brother        Pt has 3 brothers that died of MIs between ages 22s-70s   CAD Brother    CAD Brother    Colon cancer Neg Hx    Ulcers Neg Hx     Social History   Socioeconomic History   Marital status: Married    Spouse name: Not on file   Number of children: Not on file   Years of education: Not on file   Highest education level: Not on file  Occupational History   Not on file  Tobacco Use   Smoking status: Never   Smokeless tobacco: Never  Vaping Use   Vaping Use: Never used  Substance and Sexual Activity   Alcohol use: No   Drug use: No   Sexual activity: Not Currently  Other Topics Concern   Not on file  Social History Narrative   Not on file   Social Determinants of Health   Financial Resource Strain: Low Risk  (01/24/2021)   Overall Financial Resource Strain (CARDIA)    Difficulty of Paying Living Expenses: Not hard at all  Food Insecurity: No Food Insecurity (01/24/2021)   Hunger Vital Sign    Worried About Running Out of Food in the Last Year: Never true    Ran Out of Food in the Last Year: Never true  Transportation Needs: No Transportation Needs (01/24/2021)   PRAPARE - Transportation  Lack of Transportation (Medical): No    Lack of Transportation (Non-Medical): No  Physical Activity: Sufficiently Active (01/24/2021)   Exercise Vital Sign    Days of Exercise per Week: 5 days    Minutes of Exercise per Session: 60 min  Stress: No Stress Concern Present (01/24/2021)   [place]    Feeling of Stress : Not at all  Social Connections: Moderately Isolated (01/24/2021)   Social Connection and Isolation Panel [NHANES]    Frequency of Communication with Friends and Family: Twice a week    Frequency of Social Gatherings with Friends and Family: Twice a week    Attends Religious Services: Never    Marine scientist or Organizations: No    Attends Theatre manager Meetings: Never    Marital Status: Married  Human resources officer Violence: Not At Risk (01/24/2021)   Humiliation, Afraid, Rape, and Kick questionnaire    Fear of Current or Ex-Partner: No    Emotionally Abused: No    Physically Abused: No    Sexually Abused: No    Outpatient Medications Prior to Visit  Medication Sig Dispense Refill   amLODipine (NORVASC) 5 MG tablet TAKE 1 TABLET DAILY 90 tablet 3   aspirin EC 81 MG tablet Take 81 mg by mouth daily.      carbamide peroxide (DEBROX) 6.5 % OTIC solution Place 5 drops into both ears 2 (two) times daily. 15 mL 0   glucose blood (FREESTYLE LITE) test strip USE TO TEST BLOOD SUGAR DAILY 100 strip 3   HYDROcodone-acetaminophen (NORCO) 5-325 MG tablet Take 1-2 tablets by mouth every 6 (six) hours as needed. 10 tablet 0   insulin glargine (LANTUS) 100 UNIT/ML injection Inject 0.22 mLs (22 Units total) into the skin at bedtime. 10 mL 1   metFORMIN (GLUCOPHAGE) 500 MG tablet TAKE 1 TABLET TWICE A DAY 90 tablet 7   metoprolol succinate (TOPROL-XL) 50 MG 24 hr tablet TAKE 1 TABLET DAILY 90 tablet 3   MICARDIS HCT 40-12.5 MG tablet TAKE 1 TABLET DAILY 90 tablet 3   nitrofurantoin, macrocrystal-monohydrate, (MACROBID) 100 MG capsule Take 1 capsule (100 mg total) by mouth 2 (two) times daily. 14 capsule 0   ondansetron (ZOFRAN-ODT) 8 MG disintegrating tablet 39m ODT q4 hours prn nausea 10 tablet 0   TRULICITY 01.60MFU/9.3ATSOPN INJECT 0.75 MG UNDER THE SKIN EVERY WEEK 6 mL 3   VITAMIN D PO Take by mouth daily.     atorvastatin (LIPITOR) 40 MG tablet Take 40 mg by mouth every evening.     levothyroxine (SYNTHROID) 88 MCG tablet Take 1 tablet (88 mcg total) by mouth daily before breakfast. 90 tablet 1   No facility-administered medications prior to visit.    No Known Allergies  ROS Review of Systems  Constitutional:  Negative for fatigue and fever.  HENT:  Negative for sinus pressure and sinus pain.   Respiratory:  Negative for chest  tightness and shortness of breath.   Cardiovascular:  Negative for chest pain and palpitations.  Gastrointestinal:  Negative for diarrhea, nausea and vomiting.  Endocrine: Negative for polydipsia, polyphagia and polyuria.  Genitourinary:  Negative for frequency.  Musculoskeletal:  Negative for gait problem and neck pain.      Objective:    Physical Exam HENT:     Head: Normocephalic.  Cardiovascular:     Rate and Rhythm: Normal rate and regular rhythm.     Pulses: Normal pulses.  Heart sounds: Normal heart sounds.  Pulmonary:     Effort: Pulmonary effort is normal.     Breath sounds: Normal breath sounds.  Abdominal:     Palpations: Abdomen is soft.  Neurological:     Mental Status: She is alert.     BP 130/70   Pulse (!) 104   Ht 5' 4"  (1.626 m)   Wt 123 lb 6.4 oz (56 kg)   SpO2 98%   BMI 21.18 kg/m  Wt Readings from Last 3 Encounters:  10/11/21 123 lb 6.4 oz (56 kg)  09/22/21 125 lb (56.7 kg)  07/04/21 122 lb 3.2 oz (55.4 kg)    Lab Results  Component Value Date   TSH 0.157 (L) 05/23/2021   Lab Results  Component Value Date   WBC 10.1 09/22/2021   HGB 12.3 09/22/2021   HCT 37.9 09/22/2021   MCV 87.9 09/22/2021   PLT 306 09/22/2021   Lab Results  Component Value Date   NA 136 09/22/2021   K 3.5 09/22/2021   CO2 26 09/22/2021   GLUCOSE 140 (H) 09/22/2021   BUN 19 09/22/2021   CREATININE 0.86 09/22/2021   BILITOT 0.6 05/23/2021   ALKPHOS 107 05/23/2021   AST 22 05/23/2021   ALT 21 05/23/2021   PROT 6.7 05/23/2021   ALBUMIN 4.4 05/23/2021   CALCIUM 10.4 (H) 09/22/2021   ANIONGAP 8 09/22/2021   EGFR 87 05/23/2021   Lab Results  Component Value Date   CHOL 136 10/20/2020   Lab Results  Component Value Date   HDL 67 10/20/2020   Lab Results  Component Value Date   LDLCALC 51 10/20/2020   Lab Results  Component Value Date   TRIG 97 10/20/2020   Lab Results  Component Value Date   CHOLHDL 2.0 10/20/2020   Lab Results  Component  Value Date   HGBA1C 7.0 (H) 05/23/2021      Assessment & Plan:   Problem List Items Addressed This Visit       Cardiovascular and Mediastinum   HTN (hypertension)    Controlled Compliant with medication regimens No headaches, dizziness or blurred vision      Relevant Medications   atorvastatin (LIPITOR) 40 MG tablet     Endocrine   Type 2 diabetes mellitus with other specified complication ([place]) - Primary    last HgA1c is 7.0 Reports checking blood sugar every morning Highest:134 and lowest: 74 .  Compliant with medication regimen Denies 3ps of diabetes No changes to medication regimen      Relevant Medications   atorvastatin (LIPITOR) 40 MG tablet   Other Relevant Orders   Hemoglobin A1C   Hypothyroidism    Compliant with medication regimen She takes Synthroid 88 mcg daily Refilled medication      Relevant Medications   levothyroxine (SYNTHROID) 88 MCG tablet   Other Relevant Orders   TSH + free T4     Other   HLD (hyperlipidemia)    Compliant with Lipitor 40 mg No concerns or complaint Refilled medication      Relevant Medications   atorvastatin (LIPITOR) 40 MG tablet   Other Relevant Orders   Lipid panel   Other Visit Diagnoses     Vitamin D deficiency       Relevant Orders   VITAMIN D 25 Hydroxy (Vit-D Deficiency, Fractures)       Meds ordered this encounter  Medications   atorvastatin (LIPITOR) 40 MG tablet    Sig: Take 1 tablet (40 mg  total) by mouth every evening.    Dispense:  90 tablet    Refill:  1   levothyroxine (SYNTHROID) 88 MCG tablet    Sig: Take 1 tablet (88 mcg total) by mouth daily before breakfast.    Dispense:  90 tablet    Refill:  1    Dose change - 05/24/2021    Follow-up: Return in about 4 months (around 02/11/2022).    Alvira Monday, FNP

## 2021-10-11 NOTE — Assessment & Plan Note (Addendum)
Compliant with Lipitor 40 mg No concerns or complaint Refilled medication

## 2021-10-11 NOTE — Patient Instructions (Addendum)
I appreciate the opportunity to provide care to you today!    Follow up:  4 months  Labs: please stop by the lab today to get your blood drawn (TSH, Lipid profile, HgA1c, Vit D)   [facility]    Please continue to a heart-healthy diet and increase your physical activities. Try to exercise for at least three times a week.      It was a pleasure to see you and I look forward to continuing to work together on your health and well-being. Please do not hesitate to call the office if you need care or have questions about your care.   Have a wonderful day and week. With Gratitude, Gilmore Laroche MSN, FNP-BC

## 2021-10-12 LAB — HEMOGLOBIN A1C
Est. average glucose Bld gHb Est-mCnc: 151 mg/dL
Hgb A1c MFr Bld: 6.9 % — ABNORMAL HIGH (ref 4.8–5.6)

## 2021-10-12 LAB — TSH+FREE T4
Free T4: 2.07 ng/dL — ABNORMAL HIGH (ref 0.82–1.77)
TSH: 0.449 u[IU]/mL — ABNORMAL LOW (ref 0.450–4.500)

## 2021-10-12 LAB — LIPID PANEL
Chol/HDL Ratio: 2 ratio (ref 0.0–4.4)
Cholesterol, Total: 133 mg/dL (ref 100–199)
HDL: 67 mg/dL (ref >39)
LDL Chol Calc (NIH): 49 mg/dL (ref 0–99)
Triglycerides: 89 mg/dL (ref 0–149)
VLDL Cholesterol Cal: 17 mg/dL (ref 5–40)

## 2021-10-12 LAB — VITAMIN D 25 HYDROXY (VIT D DEFICIENCY, FRACTURES): Vit D, 25-Hydroxy: 49.9 ng/mL (ref 30.0–100.0)

## 2021-10-13 ENCOUNTER — Other Ambulatory Visit: Payer: Self-pay | Admitting: Family Medicine

## 2021-10-13 DIAGNOSIS — E039 Hypothyroidism, unspecified: Secondary | ICD-10-CM

## 2021-10-13 NOTE — Progress Notes (Signed)
Please inform the patient to return for labs in 6 weeks to check her thyroid levels. Please encourage her to continue taking her metformin

## 2021-10-23 ENCOUNTER — Telehealth: Payer: Self-pay | Admitting: Family Medicine

## 2021-10-23 ENCOUNTER — Other Ambulatory Visit: Payer: Self-pay

## 2021-10-23 DIAGNOSIS — E782 Mixed hyperlipidemia: Secondary | ICD-10-CM

## 2021-10-23 MED ORDER — ATORVASTATIN CALCIUM 40 MG PO TABS: 40.0000 mg | ORAL_TABLET | Freq: Every evening | ORAL | 1 refills | Status: DC

## 2021-10-23 NOTE — Telephone Encounter (Signed)
Refill sent.

## 2021-10-23 NOTE — Telephone Encounter (Signed)
Patient needs refill on    atorvastatin (LIPITOR) 40 MG tablet    [facility]

## 2021-11-03 ENCOUNTER — Other Ambulatory Visit: Payer: Self-pay | Admitting: Internal Medicine

## 2021-11-03 DIAGNOSIS — E1169 Type 2 diabetes mellitus with other specified complication: Secondary | ICD-10-CM

## 2021-12-06 ENCOUNTER — Ambulatory Visit (INDEPENDENT_AMBULATORY_CARE_PROVIDER_SITE_OTHER): Payer: Medicare Other

## 2021-12-06 DIAGNOSIS — Z23 Encounter for immunization: Secondary | ICD-10-CM

## 2022-01-18 ENCOUNTER — Other Ambulatory Visit (HOSPITAL_COMMUNITY): Payer: Self-pay | Admitting: Family Medicine

## 2022-01-18 DIAGNOSIS — Z1231 Encounter for screening mammogram for malignant neoplasm of breast: Secondary | ICD-10-CM

## 2022-01-29 ENCOUNTER — Ambulatory Visit (INDEPENDENT_AMBULATORY_CARE_PROVIDER_SITE_OTHER): Payer: Medicare Other | Admitting: Internal Medicine

## 2022-01-29 ENCOUNTER — Encounter: Payer: Self-pay | Admitting: Internal Medicine

## 2022-01-29 DIAGNOSIS — Z0000 Encounter for general adult medical examination without abnormal findings: Secondary | ICD-10-CM

## 2022-01-29 NOTE — Progress Notes (Signed)
Subjective:  This is a telephone encounter between Lisa Beck and Lisa Beck on 01/29/2022 for AWV. The visit was conducted with the patient located at home and Lisa Beck at [facility]. The patient's identity was confirmed using their DOB and current address. The patient has consented to being evaluated through a telephone encounter and understands the associated risks (an examination cannot be done and the patient may need to come in for an appointment) / benefits (allows the patient to remain at home, decreasing exposure to coronavirus).    Lisa Beck is a 81 y.o. female who presents for Medicare Annual (Subsequent) preventive examination.  Review of Systems    Review of Systems  All other systems reviewed and are negative.   Objective:    There were no vitals filed for this visit. There is no height or weight on file to calculate BMI.     01/29/2022   11:32 AM 09/22/2021    1:35 AM 02/27/2021    4:52 PM 02/16/2021    6:34 AM 02/14/2021    2:29 PM 01/24/2021    9:31 AM 11/30/2020    8:24 AM  Advanced Directives  Does Patient Have a Medical Advance Directive? No No Yes No No Yes Yes  Type of Personnel officer;Living will [place];Living will [place];Living will  Does patient want to make changes to medical advance directive?     No - Patient declined No - Patient declined No - Patient declined  Copy of [place] in Chart?     No - copy requested Yes - validated most recent copy scanned in chart (See row information) No - copy requested  Would patient like information on creating a medical advance directive?  No - Patient declined  No - Patient declined No - Patient declined      Current Medications (verified) Outpatient Encounter Medications as of 01/29/2022  Medication Sig   amLODipine (NORVASC) 5 MG tablet TAKE 1 TABLET DAILY   aspirin EC 81 MG tablet Take 81 mg by mouth daily.     atorvastatin (LIPITOR) 40 MG tablet Take 1 tablet (40 mg total) by mouth every evening.   carbamide peroxide (DEBROX) 6.5 % OTIC solution Place 5 drops into both ears 2 (two) times daily.   glucose blood (FREESTYLE LITE) test strip USE TO TEST BLOOD SUGAR DAILY   HYDROcodone-acetaminophen (NORCO) 5-325 MG tablet Take 1-2 tablets by mouth every 6 (six) hours as needed.   LANTUS SOLOSTAR 100 UNIT/ML Solostar Pen INJECT 22 UNITS UNDER THE SKIN AT BEDTIME   levothyroxine (SYNTHROID) 88 MCG tablet Take 1 tablet (88 mcg total) by mouth daily before breakfast.   metFORMIN (GLUCOPHAGE) 500 MG tablet TAKE 1 TABLET TWICE A DAY   metoprolol succinate (TOPROL-XL) 50 MG 24 hr tablet TAKE 1 TABLET DAILY   MICARDIS HCT 40-12.5 MG tablet TAKE 1 TABLET DAILY   nitrofurantoin, macrocrystal-monohydrate, (MACROBID) 100 MG capsule Take 1 capsule (100 mg total) by mouth 2 (two) times daily.   ondansetron (ZOFRAN-ODT) 8 MG disintegrating tablet 8mg  ODT q4 hours prn nausea   TRULICITY A999333 0000000 SOPN INJECT 0.75 MG UNDER THE SKIN EVERY WEEK   VITAMIN D PO Take by mouth daily.   No facility-administered encounter medications on file as of 01/29/2022.    Allergies (verified) Patient has no known allergies.   History: Past Medical History:  Diagnosis Date   Arthritis  Diabetes mellitus without complication ([place])    History of kidney stones    HOH (hard of hearing)    Hyperlipidemia    Hypertension    Hypothyroidism    Past Surgical History:  Procedure Laterality Date   COLONOSCOPY  2007   Dr. Laural Golden: diverticulosis   COLONOSCOPY WITH PROPOFOL N/A 04/24/2019   Dr. Gala Romney: Diverticulosis in the sigmoid colon, two 5 to 9 mm polyps removed from the descending and ascending colon.  Pathology revealed both to be benign polyps with no adenomatous changes. No future surveillance colonoscopies due to age.   ESOPHAGOGASTRODUODENOSCOPY (EGD) WITH PROPOFOL N/A 04/24/2019   Dr. Gala Romney: Small hiatal hernia, one  gastric polyp benign.   POLYPECTOMY  04/24/2019   Procedure: POLYPECTOMY;  Surgeon: Daneil Dolin, MD;  [facility];  Service: Endoscopy;;  ascending;   REVERSE SHOULDER ARTHROPLASTY Right 02/16/2021   Procedure: REVERSE SHOULDER ARTHROPLASTY;  Surgeon: Mordecai Rasmussen, MD;  Location: [facility];  Service: Orthopedics;  Laterality: Right;   Family History  Problem Relation Age of Onset   Breast cancer Mother    CAD Father        MI sometime in 103, age unknown   CAD Brother        Pt has 3 brothers that died of MIs between ages 67s-70s   CAD Brother    CAD Brother    Colon cancer Neg Hx    Ulcers Neg Hx    Social History   Socioeconomic History   Marital status: Married    Spouse name: Not on file   Number of children: Not on file   Years of education: Not on file   Highest education level: Not on file  Occupational History   Not on file  Tobacco Use   Smoking status: Never   Smokeless tobacco: Never  Vaping Use   Vaping Use: Never used  Substance and Sexual Activity   Alcohol use: No   Drug use: No   Sexual activity: Not Currently  Other Topics Concern   Not on file  Social History Narrative   Not on file   Social Determinants of Health   Financial Resource Strain: Low Risk  (01/24/2021)   Overall Financial Resource Strain (CARDIA)    Difficulty of Paying Living Expenses: Not hard at all  Food Insecurity: No Food Insecurity (01/24/2021)   Hunger Vital Sign    Worried About Running Out of Food in the Last Year: Never true    Ran Out of Food in the Last Year: Never true  Transportation Needs: No Transportation Needs (01/24/2021)   PRAPARE - Hydrologist (Medical): No    Lack of Transportation (Non-Medical): No  Physical Activity: Sufficiently Active (01/24/2021)   Exercise Vital Sign    Days of Exercise per Week: 5 days    Minutes of Exercise per Session: 60 min  Stress: No Stress Concern Present (01/24/2021)   [place]    Feeling of Stress : Not at all  Social Connections: Moderately Isolated (01/24/2021)   Social Connection and Isolation Panel [NHANES]    Frequency of Communication with Friends and Family: Twice a week    Frequency of Social Gatherings with Friends and Family: Twice a week    Attends Religious Services: Never    Marine scientist or Organizations: No    Attends Archivist Meetings: Never    Marital Status: Married  Tobacco Counseling Counseling given: Not Answered   Clinical Intake:  Pre-visit preparation completed: Yes  Pain : No/denies pain     Diabetes: Yes CBG done?: No Did pt. bring in CBG monitor from home?: No  How often do you need to have someone help you when you read instructions, pamphlets, or other written materials from your doctor or pharmacy?: 1 - Never What is the last grade level you completed in school?: 12th grade   Activities of Daily Living    01/29/2022   11:35 AM 01/26/2022   10:04 AM  In your present state of health, do you have any difficulty performing the following activities:  Hearing? 0 0  Vision? 0 0  Difficulty concentrating or making decisions? 0 0  Walking or climbing stairs? 0 0  Dressing or bathing? 0 0  Doing errands, shopping? 0 0  Preparing Food and eating ?  N  Using the Toilet?  N  In the past six months, have you accidently leaked urine?  N  Do you have problems with loss of bowel control?  N  Managing your Medications?  N  Managing your Finances?  N  Housekeeping or managing your Housekeeping?  N    Patient Care Team: Alvira Monday, FNP as PCP - General (Family Medicine) Gala Romney Cristopher Estimable, MD as Consulting Physician (Gastroenterology) Okey Regal, OD (Optometry)  Indicate any recent Medical Services you may have received from other than [facility] providers in the past year (date may be approximate).     Assessment:   This is a  routine wellness examination for [place].  Hearing/Vision screen No results found.  Dietary issues and exercise activities discussed:     Goals Addressed   None    Depression Screen    01/29/2022   11:35 AM 10/11/2021    9:26 AM 08/01/2021   11:42 AM 07/04/2021   11:07 AM 05/23/2021    8:24 AM 01/24/2021    9:32 AM 01/24/2021    9:29 AM  PHQ 2/9 Scores  PHQ - 2 Score 0 0 0 0 0 0 0    Fall Risk    01/29/2022   11:35 AM 01/26/2022   10:04 AM 10/11/2021    9:26 AM 08/01/2021   11:41 AM 07/04/2021   11:07 AM  Fall Risk   Falls in the past year? 0 0 0 0 0  Number falls in past yr: 0  0 0 0  Injury with Fall? 0 0 0 0 0  Risk for fall due to :   No Fall Risks No Fall Risks No Fall Risks  Follow up   Falls evaluation completed Falls evaluation completed Falls evaluation completed    FALL RISK PREVENTION PERTAINING TO THE HOME:  Any stairs in or around the home? Yes  If so, are there any without handrails? Yes  Home free of loose throw rugs in walkways, pet beds, electrical cords, etc? Yes  Adequate lighting in your home to reduce risk of falls? Yes   ASSISTIVE DEVICES UTILIZED TO PREVENT FALLS:  Life alert? No  Use of a cane, walker or w/c? No  Grab bars in the bathroom? Yes  Shower chair or bench in shower? No  Elevated toilet seat or a handicapped toilet? No   Cognitive Function:    01/24/2021    9:32 AM  MMSE - Mini Mental State Exam  Not completed: Unable to complete        01/29/2022   11:35 AM 01/24/2021  9:33 AM  6CIT Screen  What Year? 0 points 0 points  What month? 0 points 0 points  What time? 0 points 0 points  Count back from 20 0 points 0 points  Months in reverse 0 points 0 points  Repeat phrase 0 points 2 points  Total Score 0 points 2 points    Immunizations Immunization History  Administered Date(s) Administered   Fluad Quad(high Dose 65+) 12/01/2020, 12/06/2021   Moderna Sars-Covid-2 Vaccination 04/11/2019, 05/11/2019, 07/21/2020    PNEUMOCOCCAL CONJUGATE-20 05/23/2021    TDAP status: Due, Education has been provided regarding the importance of this vaccine. Advised may receive this vaccine at local pharmacy or Health Dept. Aware to provide a copy of the vaccination record if obtained from local pharmacy or Health Dept. Verbalized acceptance and understanding.  Flu Vaccine status: Up to date  Pneumococcal vaccine status: Up to date  Covid-19 vaccine status: Completed vaccines  Qualifies for Shingles Vaccine? Yes   Zostavax completed No   Shingrix Completed?: No.    Education has been provided regarding the importance of this vaccine. Patient has been advised to call insurance company to determine out of pocket expense if they have not yet received this vaccine. Advised may also receive vaccine at local pharmacy or Health Dept. Verbalized acceptance and understanding.  Screening Tests Health Maintenance  Topic Date Due   Diabetic kidney evaluation - Urine ACR  Never done   TETANUS/TDAP  Never done   Zoster Vaccines- Shingrix (1 of 2) Never done   COVID-19 Vaccine (4 - Moderna series) 09/15/2020   FOOT EXAM  10/19/2021   Medicare Annual Wellness (AWV)  01/24/2022   HEMOGLOBIN A1C  04/13/2022   OPHTHALMOLOGY EXAM  07/04/2022   Diabetic kidney evaluation - GFR measurement  09/23/2022   Pneumonia Vaccine 46+ Years old  Completed   INFLUENZA VACCINE  Completed   DEXA SCAN  Completed   HPV VACCINES  Aged Out    Health Maintenance  Health Maintenance Due  Topic Date Due   Diabetic kidney evaluation - Urine ACR  Never done   TETANUS/TDAP  Never done   Zoster Vaccines- Shingrix (1 of 2) Never done   COVID-19 Vaccine (4 - Moderna series) 09/15/2020   FOOT EXAM  10/19/2021   Medicare Annual Wellness (AWV)  01/24/2022    Colorectal cancer screening: No longer required.   Mammogram status: Next mammogram is on  02/15/2022.  Bone Density status: Completed 10/24/2020. Results reflect: Bone density results:  OSTEOPOROSIS. Did not tolerate medication given to her.   Lung Cancer Screening: (Low Dose CT Chest recommended if Age 64-80 years, 30 pack-year currently smoking OR have quit w/in 15years.) does not qualify.    Additional Screening:  Vision Screening: Recommended annual ophthalmology exams for early detection of glaucoma and other disorders of the eye. Is the patient up to date with their annual eye exam?  Yes  Who is the provider or what is the name of the office in which the patient attends annual eye exams? Dr.Rankin If pt is not established with a provider, would they like to be referred to a provider to establish care? No .   Dental Screening: Recommended annual dental exams for proper oral hygiene  Community Resource Referral / Chronic Care Management: CRR required this visit?  No   CCM required this visit?  No      Plan:     I have personally reviewed and noted the following in the patient's chart:   Medical and  social history Use of alcohol, tobacco or illicit drugs  Current medications and supplements including opioid prescriptions. Patient is not currently taking opioid prescriptions. Functional ability and status Nutritional status Physical activity Advanced directives List of other physicians Hospitalizations, surgeries, and ER visits in previous 12 months Vitals Screenings to include cognitive, depression, and falls Referrals and appointments  In addition, I have reviewed and discussed with patient certain preventive protocols, quality metrics, and best practice recommendations. A written personalized care plan for preventive services as well as general preventive health recommendations were provided to patient.     Lisa Dy, MD   01/29/2022

## 2022-02-05 ENCOUNTER — Ambulatory Visit (HOSPITAL_COMMUNITY)
Admission: RE | Admit: 2022-02-05 | Discharge: 2022-02-05 | Disposition: A | Discharge status: Home / Self Care | Payer: Medicare Other | Source: Ambulatory Visit | Attending: Family Medicine | Admitting: Family Medicine

## 2022-02-05 DIAGNOSIS — Z1231 Encounter for screening mammogram for malignant neoplasm of breast: Secondary | ICD-10-CM | POA: Diagnosis present

## 2022-02-12 ENCOUNTER — Encounter: Payer: Self-pay | Admitting: Family Medicine

## 2022-02-12 ENCOUNTER — Ambulatory Visit (INDEPENDENT_AMBULATORY_CARE_PROVIDER_SITE_OTHER): Payer: Medicare Other | Admitting: Family Medicine

## 2022-02-12 VITALS — BP 127/71 | HR 90 | Ht 64.0 in | Wt 123.1 lb

## 2022-02-12 DIAGNOSIS — E782 Mixed hyperlipidemia: Secondary | ICD-10-CM

## 2022-02-12 DIAGNOSIS — E1169 Type 2 diabetes mellitus with other specified complication: Secondary | ICD-10-CM

## 2022-02-12 DIAGNOSIS — E039 Hypothyroidism, unspecified: Secondary | ICD-10-CM

## 2022-02-12 DIAGNOSIS — E559 Vitamin D deficiency, unspecified: Secondary | ICD-10-CM

## 2022-02-12 DIAGNOSIS — Z794 Long term (current) use of insulin: Secondary | ICD-10-CM

## 2022-02-12 DIAGNOSIS — I10 Essential (primary) hypertension: Secondary | ICD-10-CM

## 2022-02-12 NOTE — Progress Notes (Signed)
Established Patient Office Visit  Subjective:  Patient ID: Lisa Beck, female    DOB: [birth year]  Age: 81 y.o. MRN: [id number]  CC:  Chief Complaint  Patient presents with   Follow-up    4 month f/u     HPI Lisa Beck is a 81 y.o. female with past medical history of hypertension, hypothyroidism, type 2 diabetes, hyperlipidemia presents for f/u of  chronic medical conditions.  Hypertension: She takes amlodipine 5 mg, metoprolol 50 mg,  and MICARDIS HCT 40-12.5 mg daily.  She denies headaches, dizziness, blurred vision, chest pain, palpitation, shortness of breath  Hypothyroidism: She takes atorvastatin 40 mg daily.  She denies muscle aches and pain  Type 2 diabetes: She takes metformin 500 mg twice daily and Trulicity 3.35 subcu weekly injection.  She denies polyuria, polyphagia, polydipsia  Past Medical History:  Diagnosis Date   Arthritis    Diabetes mellitus without complication ([place])    History of kidney stones    HOH (hard of hearing)    Hyperlipidemia    Hypertension    Hypothyroidism     Past Surgical History:  Procedure Laterality Date   COLONOSCOPY  2007   Dr. Laural Golden: diverticulosis   COLONOSCOPY WITH PROPOFOL N/A 04/24/2019   Dr. Gala Romney: Diverticulosis in the sigmoid colon, two 5 to 9 mm polyps removed from the descending and ascending colon.  Pathology revealed both to be benign polyps with no adenomatous changes. No future surveillance colonoscopies due to age.   ESOPHAGOGASTRODUODENOSCOPY (EGD) WITH PROPOFOL N/A 04/24/2019   Dr. Gala Romney: Small hiatal hernia, one gastric polyp benign.   POLYPECTOMY  04/24/2019   Procedure: POLYPECTOMY;  Surgeon: Daneil Dolin, MD;  [facility]: [facility];  Service: Endoscopy;;  ascending;   REVERSE SHOULDER ARTHROPLASTY Right 02/16/2021   Procedure: REVERSE SHOULDER ARTHROPLASTY;  Surgeon: Mordecai Rasmussen, MD;  Location: [facility];  Service: Orthopedics;  Laterality: Right;    Family History  Problem Relation Age of Onset    Breast cancer Mother    CAD Father        MI sometime in 56, age unknown   CAD Brother        Pt has 3 brothers that died of MIs between ages 66s-70s   CAD Brother    CAD Brother    Colon cancer Neg Hx    Ulcers Neg Hx     Social History   Socioeconomic History   Marital status: Married    Spouse name: Not on file   Number of children: Not on file   Years of education: Not on file   Highest education level: Not on file  Occupational History   Not on file  Tobacco Use   Smoking status: Never   Smokeless tobacco: Never  Vaping Use   Vaping Use: Never used  Substance and Sexual Activity   Alcohol use: No   Drug use: No   Sexual activity: Not Currently  Other Topics Concern   Not on file  Social History Narrative   Not on file   Social Determinants of Health   Financial Resource Strain: Low Risk  (01/24/2021)   Overall Financial Resource Strain (CARDIA)    Difficulty of Paying Living Expenses: Not hard at all  Food Insecurity: No Food Insecurity (01/24/2021)   Hunger Vital Sign    Worried About Running Out of Food in the Last Year: Never true    Ran Out of Food in the Last Year: Never true  Transportation  Needs: No Transportation Needs (01/24/2021)   PRAPARE - Hydrologist (Medical): No    Lack of Transportation (Non-Medical): No  Physical Activity: Sufficiently Active (01/24/2021)   Exercise Vital Sign    Days of Exercise per Week: 5 days    Minutes of Exercise per Session: 60 min  Stress: No Stress Concern Present (01/24/2021)   [place]    Feeling of Stress : Not at all  Social Connections: Moderately Isolated (01/24/2021)   Social Connection and Isolation Panel [NHANES]    Frequency of Communication with Friends and Family: Twice a week    Frequency of Social Gatherings with Friends and Family: Twice a week    Attends Religious Services: Never    Museum/gallery conservator or Organizations: No    Attends Archivist Meetings: Never    Marital Status: Married  Human resources officer Violence: Not At Risk (01/24/2021)   Humiliation, Afraid, Rape, and Kick questionnaire    Fear of Current or Ex-Partner: No    Emotionally Abused: No    Physically Abused: No    Sexually Abused: No    Outpatient Medications Prior to Visit  Medication Sig Dispense Refill   amLODipine (NORVASC) 5 MG tablet TAKE 1 TABLET DAILY 90 tablet 3   aspirin EC 81 MG tablet Take 81 mg by mouth daily.      atorvastatin (LIPITOR) 40 MG tablet Take 1 tablet (40 mg total) by mouth every evening. 90 tablet 1   carbamide peroxide (DEBROX) 6.5 % OTIC solution Place 5 drops into both ears 2 (two) times daily. 15 mL 0   glucose blood (FREESTYLE LITE) test strip USE TO TEST BLOOD SUGAR DAILY 100 strip 3   LANTUS SOLOSTAR 100 UNIT/ML Solostar Pen INJECT 22 UNITS UNDER THE SKIN AT BEDTIME 15 mL 4   levothyroxine (SYNTHROID) 88 MCG tablet Take 1 tablet (88 mcg total) by mouth daily before breakfast. 90 tablet 1   metFORMIN (GLUCOPHAGE) 500 MG tablet TAKE 1 TABLET TWICE A DAY 90 tablet 7   metoprolol succinate (TOPROL-XL) 50 MG 24 hr tablet TAKE 1 TABLET DAILY 90 tablet 3   MICARDIS HCT 40-12.5 MG tablet TAKE 1 TABLET DAILY 90 tablet 3   nitrofurantoin, macrocrystal-monohydrate, (MACROBID) 100 MG capsule Take 1 capsule (100 mg total) by mouth 2 (two) times daily. 14 capsule 0   ondansetron (ZOFRAN-ODT) 8 MG disintegrating tablet 66m ODT q4 hours prn nausea 10 tablet 0   TRULICITY 05.02MDX/4.1OISOPN INJECT 0.75 MG UNDER THE SKIN EVERY WEEK 6 mL 3   VITAMIN D PO Take by mouth daily.     No facility-administered medications prior to visit.    No Known Allergies  ROS Review of Systems  Constitutional:  Negative for fatigue and fever.  Eyes:  Negative for visual disturbance.  Respiratory:  Negative for chest tightness and shortness of breath.   Cardiovascular:  Negative for chest pain and  palpitations.  Endocrine: Negative for polydipsia, polyphagia and polyuria.  Neurological:  Negative for dizziness and headaches.      Objective:    Physical Exam HENT:     Head: Normocephalic.     Nose: No congestion.     Mouth/Throat:     Mouth: Mucous membranes are moist.  Cardiovascular:     Rate and Rhythm: Normal rate and regular rhythm.     Pulses: Normal pulses.     Heart sounds: Normal heart sounds.  Pulmonary:     Effort: Pulmonary effort is normal.     Breath sounds: Normal breath sounds.  Neurological:     Mental Status: She is alert.     BP 127/71   Pulse 90   Ht _0  (1.626 m)   Wt 123 lb 1.3 oz (55.8 kg)   SpO2 97%   BMI 21.13 kg/m  Wt Readings from Last 3 Encounters:  02/12/22 123 lb 1.3 oz (55.8 kg)  10/11/21 123 lb 6.4 oz (56 kg)  09/22/21 125 lb (56.7 kg)    Lab Results  Component Value Date   TSH 0.449 (L) 10/11/2021   Lab Results  Component Value Date   WBC 10.1 09/22/2021   HGB 12.3 09/22/2021   HCT 37.9 09/22/2021   MCV 87.9 09/22/2021   PLT 306 09/22/2021   Lab Results  Component Value Date   NA 136 09/22/2021   K 3.5 09/22/2021   CO2 26 09/22/2021   GLUCOSE 140 (H) 09/22/2021   BUN 19 09/22/2021   CREATININE 0.86 09/22/2021   BILITOT 0.6 05/23/2021   ALKPHOS 107 05/23/2021   AST 22 05/23/2021   ALT 21 05/23/2021   PROT 6.7 05/23/2021   ALBUMIN 4.4 05/23/2021   CALCIUM 10.4 (H) 09/22/2021   ANIONGAP 8 09/22/2021   EGFR 87 05/23/2021   Lab Results  Component Value Date   CHOL 133 10/11/2021   Lab Results  Component Value Date   HDL 67 10/11/2021   Lab Results  Component Value Date   LDLCALC 49 10/11/2021   Lab Results  Component Value Date   TRIG 89 10/11/2021   Lab Results  Component Value Date   CHOLHDL 2.0 10/11/2021   Lab Results  Component Value Date   HGBA1C 6.9 (H) 10/11/2021      Assessment & Plan:  Type 2 diabetes mellitus with other specified complication, with long-term current use of  insulin ([place]) Assessment & Plan: Encouraged to continue taking metformin 500 mg twice daily and Trulicity 2.35 subcu weekly injection Will assess hemoglobin A1c today and make adjustments to medications as necessary Lab Results  Component Value Date   HGBA1C 6.9 (H) 10/11/2021      Orders: -     HM Diabetes Foot Exam -     Microalbumin / creatinine urine ratio -     Hemoglobin A1c  Primary hypertension Assessment & Plan: Controlled Encouraged to continue taking amlodipine 5 mg, metoprolol 50 mg,  and MICARDIS HCT 40-12.5 mg daily Will assess CBC and CMP today BP Readings from Last 3 Encounters:  02/12/22 127/71  10/11/21 130/70  09/22/21 133/78     Orders: -     CMP14+EGFR -     CBC with Differential/Platelet  Hypothyroidism, unspecified type Assessment & Plan: Encouraged to continue taking atorvastatin 40 mg daily Will assess lipid levels today Lab Results  Component Value Date   CHOL 133 10/11/2021   HDL 67 10/11/2021   LDLCALC 49 10/11/2021   TRIG 89 10/11/2021   CHOLHDL 2.0 10/11/2021     Orders: -     TSH + free T4  Mixed hyperlipidemia -     Lipid panel  Vitamin D deficiency -     VITAMIN D 25 Hydroxy (Vit-D Deficiency, Fractures)    Follow-up: Return in about 4 months (around 06/13/2022).   Alvira Monday, FNP

## 2022-02-12 NOTE — Assessment & Plan Note (Signed)
Encouraged to continue taking metformin 500 mg twice daily and Trulicity 0.75 subcu weekly injection Will assess hemoglobin A1c today and make adjustments to medications as necessary Lab Results  Component Value Date   HGBA1C 6.9 (H) 10/11/2021

## 2022-02-12 NOTE — Assessment & Plan Note (Signed)
Encouraged to continue taking atorvastatin 40 mg daily Will assess lipid levels today Lab Results  Component Value Date   CHOL 133 10/11/2021   HDL 67 10/11/2021   LDLCALC 49 10/11/2021   TRIG 89 10/11/2021   CHOLHDL 2.0 10/11/2021

## 2022-02-12 NOTE — Assessment & Plan Note (Signed)
Controlled Encouraged to continue taking amlodipine 5 mg, metoprolol 50 mg,  and MICARDIS HCT 40-12.5 mg daily Will assess CBC and CMP today BP Readings from Last 3 Encounters:  02/12/22 127/71  10/11/21 130/70  09/22/21 133/78

## 2022-02-12 NOTE — Patient Instructions (Signed)
I appreciate the opportunity to provide care to you today!    Follow up:  4 months  Labs: please stop by the lab today to get your blood drawn (CBC, CMP, TSH, Lipid profile, HgA1c, Vit D)    Please continue to a heart-healthy diet and increase your physical activities. Try to exercise for 30mins at least three times a week.      It was a pleasure to see you and I look forward to continuing to work together on your health and well-being. Please do not hesitate to call the office if you need care or have questions about your care.   Have a wonderful day and week. With Gratitude, Chidinma Clites MSN, FNP-BC  

## 2022-02-13 ENCOUNTER — Ambulatory Visit (INDEPENDENT_AMBULATORY_CARE_PROVIDER_SITE_OTHER): Payer: Medicare Other

## 2022-02-13 ENCOUNTER — Ambulatory Visit (INDEPENDENT_AMBULATORY_CARE_PROVIDER_SITE_OTHER): Payer: Medicare Other | Admitting: Orthopedic Surgery

## 2022-02-13 ENCOUNTER — Encounter: Payer: Self-pay | Admitting: Orthopedic Surgery

## 2022-02-13 VITALS — BP 123/66 | HR 92 | Ht 64.0 in | Wt 123.0 lb

## 2022-02-13 DIAGNOSIS — Z96611 Presence of right artificial shoulder joint: Secondary | ICD-10-CM

## 2022-02-13 LAB — CBC WITH DIFFERENTIAL/PLATELET
Basophils Absolute: 0.1 10*3/uL (ref 0.0–0.2)
Basos: 1 %
EOS (ABSOLUTE): 0.1 10*3/uL (ref 0.0–0.4)
Eos: 2 %
Hematocrit: 39.7 % (ref 34.0–46.6)
Hemoglobin: 12.8 g/dL (ref 11.1–15.9)
Immature Grans (Abs): 0 10*3/uL (ref 0.0–0.1)
Immature Granulocytes: 0 %
Lymphocytes Absolute: 2.4 10*3/uL (ref 0.7–3.1)
Lymphs: 27 %
MCH: 27.4 pg (ref 26.6–33.0)
MCHC: 32.2 g/dL (ref 31.5–35.7)
MCV: 85 fL (ref 79–97)
Monocytes Absolute: 0.6 10*3/uL (ref 0.1–0.9)
Monocytes: 7 %
Neutrophils Absolute: 5.7 10*3/uL (ref 1.4–7.0)
Neutrophils: 63 %
Platelets: 357 10*3/uL (ref 150–450)
RBC: 4.67 x10E6/uL (ref 3.77–5.28)
RDW: 12.3 % (ref 11.7–15.4)
WBC: 8.9 10*3/uL (ref 3.4–10.8)

## 2022-02-13 LAB — CMP14+EGFR
ALT: 23 IU/L (ref 0–32)
AST: 19 IU/L (ref 0–40)
Albumin/Globulin Ratio: 2 (ref 1.2–2.2)
Albumin: 4.5 g/dL (ref 3.7–4.7)
Alkaline Phosphatase: 124 IU/L — ABNORMAL HIGH (ref 44–121)
BUN/Creatinine Ratio: 20 (ref 12–28)
BUN: 15 mg/dL (ref 8–27)
Bilirubin Total: 0.5 mg/dL (ref 0.0–1.2)
CO2: 23 mmol/L (ref 20–29)
Calcium: 10.9 mg/dL — ABNORMAL HIGH (ref 8.7–10.3)
Chloride: 102 mmol/L (ref 96–106)
Creatinine, Ser: 0.75 mg/dL (ref 0.57–1.00)
Globulin, Total: 2.3 g/dL (ref 1.5–4.5)
Glucose: 138 mg/dL — ABNORMAL HIGH (ref 70–99)
Potassium: 4.4 mmol/L (ref 3.5–5.2)
Sodium: 139 mmol/L (ref 134–144)
Total Protein: 6.8 g/dL (ref 6.0–8.5)
eGFR: 79 mL/min/{1.73_m2} (ref >59)

## 2022-02-13 LAB — TSH+FREE T4
Free T4: 2.12 ng/dL — ABNORMAL HIGH (ref 0.82–1.77)
TSH: 0.53 u[IU]/mL (ref 0.450–4.500)

## 2022-02-13 LAB — LIPID PANEL
Chol/HDL Ratio: 2 ratio (ref 0.0–4.4)
Cholesterol, Total: 148 mg/dL (ref 100–199)
HDL: 74 mg/dL (ref >39)
LDL Chol Calc (NIH): 54 mg/dL (ref 0–99)
Triglycerides: 117 mg/dL (ref 0–149)
VLDL Cholesterol Cal: 20 mg/dL (ref 5–40)

## 2022-02-13 LAB — HEMOGLOBIN A1C
Est. average glucose Bld gHb Est-mCnc: 148 mg/dL
Hgb A1c MFr Bld: 6.8 % — ABNORMAL HIGH (ref 4.8–5.6)

## 2022-02-13 LAB — VITAMIN D 25 HYDROXY (VIT D DEFICIENCY, FRACTURES): Vit D, 25-Hydroxy: 46.6 ng/mL (ref 30.0–100.0)

## 2022-02-13 NOTE — Progress Notes (Signed)
Orthopaedic Postop Note  Assessment: Lisa Beck is a 81 y.o. female s/p Right Reverse Shoulder Arthroplasty  DOS: 02/16/2021  Plan: Lisa Beck is doing very well.  She describes occasional pains in her right shoulder.  No concerns overall.  She is pleased with her progress.  She is able to do everything she needs to do at home.  She is not taking medications on a consistent basis.  Radiographs look very good.  I will see her back on an as-needed basis.  Follow-up: Return if symptoms worsen or fail to improve.  XR at next visit: Right shoulder  Subjective:  Chief Complaint  Patient presents with   Routine Post Op    Rt shoulder DOS 02/16/21    History of Present Illness: Lisa Beck is a 81 y.o. female who presents following the above stated procedure.  Surgery was approximately 1 year ago.  Occasional pain in her right shoulder.  She is able to complete all of her usual activities.  She states that she wrecked her yard yesterday.  She is pleased with her results.  No numbness or tingling.  No issues with her surgical incision.   Review of Systems: No fevers or chills No numbness or tingling No Chest Pain No shortness of breath   Objective: BP 123/66   Pulse 92   Ht 5\' 4"  (1.626 m)   Wt 123 lb (55.8 kg)   BMI 21.11 kg/m   Physical Exam:  Alert and oriented.  No acute distress.  Anterior based surgical incision is healed.  No surrounding erythema or drainage.  Sensation intact in the axillary nerve distribution.  Sensation intact throughout the right hand.  Active forward flexion of 150 degrees.  Internal rotation to lumbar spine.  She has good strength in the right shoulder.  No tenderness to palpation.  2+ radial pulse.   IMAGING: I personally ordered and reviewed the following images:  X-rays of the right shoulder were obtained in clinic today.  No acute injuries are noted.  There is been no subsidence of the prostheses.  No periprosthetic loosening.  No  lucencies around the hardware.  Glenohumeral joint is reduced.  Impression: Right reverse shoulder arthroplasty in stable position.   , MD 02/13/2022 9:39 AM

## 2022-02-14 LAB — MICROALBUMIN / CREATININE URINE RATIO
Creatinine, Urine: 202.7 mg/dL
Microalb/Creat Ratio: 10 mg/g creat (ref 0–29)
Microalbumin, Urine: 21.1 ug/mL

## 2022-02-20 ENCOUNTER — Other Ambulatory Visit: Payer: Self-pay | Admitting: Family Medicine

## 2022-02-20 DIAGNOSIS — E039 Hypothyroidism, unspecified: Secondary | ICD-10-CM

## 2022-02-20 NOTE — Progress Notes (Signed)
Please inform the patient that her hemoglobin A1c has decreased to 6.8.  I recommend that she continue her current treatment regimen with metformin and Trulicity.  I would like her to return for labs in 6 weeks on 04/03/2022 to assess her thyroid levels.  Her free T4 levels are slightly elevated.  All other labs are stable.

## 2022-04-03 ENCOUNTER — Other Ambulatory Visit: Payer: Self-pay | Admitting: Family Medicine

## 2022-04-03 DIAGNOSIS — E782 Mixed hyperlipidemia: Secondary | ICD-10-CM

## 2022-04-03 DIAGNOSIS — E039 Hypothyroidism, unspecified: Secondary | ICD-10-CM

## 2022-05-17 ENCOUNTER — Encounter: Payer: Self-pay | Admitting: Radiology

## 2022-05-22 ENCOUNTER — Other Ambulatory Visit: Payer: Self-pay | Admitting: Internal Medicine

## 2022-05-22 ENCOUNTER — Other Ambulatory Visit: Payer: Self-pay

## 2022-05-22 MED ORDER — TELMISARTAN-HCTZ 40-12.5 MG PO TABS: 1.0000 | ORAL_TABLET | Freq: Every day | ORAL | 3 refills | Status: DC

## 2022-05-22 MED ORDER — METOPROLOL SUCCINATE ER 50 MG PO TB24: 50.0000 mg | ORAL_TABLET | Freq: Every day | ORAL | 3 refills | Status: DC

## 2022-06-18 ENCOUNTER — Ambulatory Visit: Payer: Medicare Other | Admitting: Family Medicine

## 2022-06-19 ENCOUNTER — Encounter: Payer: Self-pay | Admitting: Family Medicine

## 2022-06-19 ENCOUNTER — Ambulatory Visit (INDEPENDENT_AMBULATORY_CARE_PROVIDER_SITE_OTHER): Payer: Medicare Other | Admitting: Family Medicine

## 2022-06-19 VITALS — BP 138/72 | HR 90 | Wt 123.0 lb

## 2022-06-19 DIAGNOSIS — Z794 Long term (current) use of insulin: Secondary | ICD-10-CM

## 2022-06-19 DIAGNOSIS — E559 Vitamin D deficiency, unspecified: Secondary | ICD-10-CM

## 2022-06-19 DIAGNOSIS — E039 Hypothyroidism, unspecified: Secondary | ICD-10-CM | POA: Diagnosis not present

## 2022-06-19 DIAGNOSIS — E782 Mixed hyperlipidemia: Secondary | ICD-10-CM | POA: Diagnosis not present

## 2022-06-19 DIAGNOSIS — H6121 Impacted cerumen, right ear: Secondary | ICD-10-CM

## 2022-06-19 DIAGNOSIS — I10 Essential (primary) hypertension: Secondary | ICD-10-CM

## 2022-06-19 DIAGNOSIS — E1169 Type 2 diabetes mellitus with other specified complication: Secondary | ICD-10-CM

## 2022-06-19 DIAGNOSIS — E0789 Other specified disorders of thyroid: Secondary | ICD-10-CM | POA: Diagnosis not present

## 2022-06-19 NOTE — Patient Instructions (Addendum)
I appreciate the opportunity to provide care to you today!    Follow up:  4 months  Labs: please stop by the lab today to get your blood drawn (CBC, CMP, TSH, Lipid profile, HgA1c, Vit D)    Please continue to a heart-healthy diet and increase your physical activities. Try to exercise for 30mins at least five days a week.      It was a pleasure to see you and I look forward to continuing to work together on your health and well-being. Please do not hesitate to call the office if you need care or have questions about your care.   Have a wonderful day and week. With Gratitude, Audyn Dimercurio MSN, FNP-BC  

## 2022-06-19 NOTE — Progress Notes (Signed)
Established Patient Office Visit  Subjective:  Patient ID: Lisa Beck, female    DOB: 07/23/1940  Age: 82 y.o. MRN: [id number]  CC:  Chief Complaint  Patient presents with   Follow-up    4 month f/u    HPI Lisa Beck is a 82 y.o. female with past medical history of hypertension, hypothyroidism, hyperlipidemia presents for f/u of  chronic medical conditions. For the details of today's visit, please refer to the assessment and plan.     Past Medical History:  Diagnosis Date   Arthritis    Diabetes mellitus without complication    History of kidney stones    HOH (hard of hearing)    Hyperlipidemia    Hypertension    Hypothyroidism     Past Surgical History:  Procedure Laterality Date   COLONOSCOPY  2007   Dr. Laural Golden: diverticulosis   COLONOSCOPY WITH PROPOFOL N/A 04/24/2019   Dr. Gala Romney: Diverticulosis in the sigmoid colon, two 5 to 9 mm polyps removed from the descending and ascending colon.  Pathology revealed both to be benign polyps with no adenomatous changes. No future surveillance colonoscopies due to age.   ESOPHAGOGASTRODUODENOSCOPY (EGD) WITH PROPOFOL N/A 04/24/2019   Dr. Gala Romney: Small hiatal hernia, one gastric polyp benign.   POLYPECTOMY  04/24/2019   Procedure: POLYPECTOMY;  Surgeon: Daneil Dolin, MD;  [facility];  Service: Endoscopy;;  ascending;   REVERSE SHOULDER ARTHROPLASTY Right 02/16/2021   Procedure: REVERSE SHOULDER ARTHROPLASTY;  Surgeon: Mordecai Rasmussen, MD;  Location: [facility];  Service: Orthopedics;  Laterality: Right;    Family History  Problem Relation Age of Onset   Breast cancer Mother    CAD Father        MI sometime in 50, age unknown   CAD Brother        Pt has 3 brothers that died of MIs between ages 7s-70s   CAD Brother    CAD Brother    Colon cancer Neg Hx    Ulcers Neg Hx     Social History   Socioeconomic History   Marital status: Married    Spouse name: Not on file   Number of children: Not on file    Years of education: Not on file   Highest education level: Not on file  Occupational History   Not on file  Tobacco Use   Smoking status: Never   Smokeless tobacco: Never  Vaping Use   Vaping Use: Never used  Substance and Sexual Activity   Alcohol use: No   Drug use: No   Sexual activity: Not Currently  Other Topics Concern   Not on file  Social History Narrative   Not on file   Social Determinants of Health   Financial Resource Strain: Low Risk  (01/24/2021)   Overall Financial Resource Strain (CARDIA)    Difficulty of Paying Living Expenses: Not hard at all  Food Insecurity: No Food Insecurity (01/24/2021)   Hunger Vital Sign    Worried About Running Out of Food in the Last Year: Never true    Ran Out of Food in the Last Year: Never true  Transportation Needs: No Transportation Needs (01/24/2021)   PRAPARE - Hydrologist (Medical): No    Lack of Transportation (Non-Medical): No  Physical Activity: Sufficiently Active (01/24/2021)   Exercise Vital Sign    Days of Exercise per Week: 5 days    Minutes of Exercise per Session: 60 min  Stress: No Stress Concern Present (01/24/2021)   [place]    Feeling of Stress : Not at all  Social Connections: Moderately Isolated (01/24/2021)   Social Connection and Isolation Panel [NHANES]    Frequency of Communication with Friends and Family: Twice a week    Frequency of Social Gatherings with Friends and Family: Twice a week    Attends Religious Services: Never    Marine scientist or Organizations: No    Attends Archivist Meetings: Never    Marital Status: Married  Human resources officer Violence: Not At Risk (01/24/2021)   Humiliation, Afraid, Rape, and Kick questionnaire    Fear of Current or Ex-Partner: No    Emotionally Abused: No    Physically Abused: No    Sexually Abused: No    Outpatient Medications Prior to Visit   Medication Sig Dispense Refill   amLODipine (NORVASC) 5 MG tablet TAKE 1 TABLET DAILY 90 tablet 3   aspirin EC 81 MG tablet Take 81 mg by mouth daily.      atorvastatin (LIPITOR) 40 MG tablet TAKE 1 TABLET EVERY EVENING 90 tablet 3   carbamide peroxide (DEBROX) 6.5 % OTIC solution Place 5 drops into both ears 2 (two) times daily. 15 mL 0   glucose blood (FREESTYLE LITE) test strip USE TO TEST BLOOD SUGAR DAILY 100 strip 3   LANTUS SOLOSTAR 100 UNIT/ML Solostar Pen INJECT 22 UNITS UNDER THE SKIN AT BEDTIME 15 mL 4   levothyroxine (SYNTHROID) 88 MCG tablet TAKE 1 TABLET DAILY BEFORE BREAKFAST 90 tablet 3   metFORMIN (GLUCOPHAGE) 500 MG tablet TAKE 1 TABLET TWICE A DAY 90 tablet 7   metoprolol succinate (TOPROL-XL) 50 MG 24 hr tablet Take 1 tablet (50 mg total) by mouth daily. Take with or immediately following a meal. 90 tablet 3   nitrofurantoin, macrocrystal-monohydrate, (MACROBID) 100 MG capsule Take 1 capsule (100 mg total) by mouth 2 (two) times daily. 14 capsule 0   ondansetron (ZOFRAN-ODT) 8 MG disintegrating tablet 8mg  ODT q4 hours prn nausea 10 tablet 0   telmisartan-hydrochlorothiazide (MICARDIS HCT) 40-12.5 MG tablet Take 1 tablet by mouth daily. 90 tablet 3   TRULICITY A999333 0000000 SOPN INJECT 0.75 MG UNDER THE SKIN EVERY WEEK 6 mL 3   VITAMIN D PO Take by mouth daily.     No facility-administered medications prior to visit.    No Known Allergies  ROS Review of Systems  Constitutional:  Negative for chills and fever.  Eyes:  Negative for visual disturbance.  Respiratory:  Negative for chest tightness and shortness of breath.   Neurological:  Negative for dizziness and headaches.      Objective:    Physical Exam HENT:     Head: Normocephalic.     Right Ear: There is impacted cerumen.     Mouth/Throat:     Mouth: Mucous membranes are moist.  Cardiovascular:     Rate and Rhythm: Normal rate.     Heart sounds: Normal heart sounds.  Pulmonary:     Effort: Pulmonary  effort is normal.     Breath sounds: Normal breath sounds.  Neurological:     Mental Status: She is alert.     BP 138/72 (BP Location: Left Arm)   Pulse 90   Wt 123 lb (55.8 kg)   SpO2 96%   BMI 21.11 kg/m  Wt Readings from Last 3 Encounters:  06/19/22 123 lb (55.8 kg)  02/13/22 123  lb (55.8 kg)  02/12/22 123 lb 1.3 oz (55.8 kg)    Lab Results  Component Value Date   TSH 0.530 02/12/2022   Lab Results  Component Value Date   WBC 8.9 02/12/2022   HGB 12.8 02/12/2022   HCT 39.7 02/12/2022   MCV 85 02/12/2022   PLT 357 02/12/2022   Lab Results  Component Value Date   NA 139 02/12/2022   K 4.4 02/12/2022   CO2 23 02/12/2022   GLUCOSE 138 (H) 02/12/2022   BUN 15 02/12/2022   CREATININE 0.75 02/12/2022   BILITOT 0.5 02/12/2022   ALKPHOS 124 (H) 02/12/2022   AST 19 02/12/2022   ALT 23 02/12/2022   PROT 6.8 02/12/2022   ALBUMIN 4.5 02/12/2022   CALCIUM 10.9 (H) 02/12/2022   ANIONGAP 8 09/22/2021   EGFR 79 02/12/2022   Lab Results  Component Value Date   CHOL 148 02/12/2022   Lab Results  Component Value Date   HDL 74 02/12/2022   Lab Results  Component Value Date   LDLCALC 54 02/12/2022   Lab Results  Component Value Date   TRIG 117 02/12/2022   Lab Results  Component Value Date   CHOLHDL 2.0 02/12/2022   Lab Results  Component Value Date   HGBA1C 6.8 (H) 02/12/2022      Assessment & Plan:  Primary hypertension Assessment & Plan: Controlled Encouraged to continue taking amlodipine 5 mg, metoprolol 50 mg,  and MICARDIS HCT 40-12.5 mg daily Will assess CBC and CMP today BP Readings from Last 3 Encounters:  06/19/22 138/72  02/13/22 123/66  02/12/22 127/71     Orders: -     CMP14+EGFR -     CBC with Differential/Platelet  Other specified disorders of thyroid -     TSH + free T4  Mixed hyperlipidemia Assessment & Plan: She takes atorvastatin 40 mg daily No muscle aches or pain reported Encouraged to continue on therapy Pending  lipid panel Lab Results  Component Value Date   CHOL 148 02/12/2022   HDL 74 02/12/2022   LDLCALC 54 02/12/2022   TRIG 117 02/12/2022   CHOLHDL 2.0 02/12/2022     Orders: -     Lipid panel  Hypothyroidism, unspecified type Assessment & Plan: She takes Synthroid 88 mcg daily on an empty stomach No complaints or concerns voiced Encouraged to continue therapy Will assess her thyroid levels today   Vitamin D deficiency -     VITAMIN D 25 Hydroxy (Vit-D Deficiency, Fractures)  Impacted cerumen, right ear Assessment & Plan: Right ear irrigation completed   Type 2 diabetes mellitus with other specified complication, with long-term current use of insulin -     Hemoglobin A1c    Follow-up: Return in about 4 months (around 10/19/2022).   Alvira Monday, FNP

## 2022-06-19 NOTE — Assessment & Plan Note (Signed)
Controlled Encouraged to continue taking amlodipine 5 mg, metoprolol 50 mg,  and MICARDIS HCT 40-12.5 mg daily Will assess CBC and CMP today BP Readings from Last 3 Encounters:  06/19/22 138/72  02/13/22 123/66  02/12/22 127/71

## 2022-06-19 NOTE — Assessment & Plan Note (Signed)
She takes atorvastatin 40 mg daily No muscle aches or pain reported Encouraged to continue on therapy Pending lipid panel Lab Results  Component Value Date   CHOL 148 02/12/2022   HDL 74 02/12/2022   LDLCALC 54 02/12/2022   TRIG 117 02/12/2022   CHOLHDL 2.0 02/12/2022

## 2022-06-19 NOTE — Assessment & Plan Note (Signed)
She takes Synthroid 88 mcg daily on an empty stomach No complaints or concerns voiced Encouraged to continue therapy Will assess her thyroid levels today

## 2022-06-19 NOTE — Assessment & Plan Note (Signed)
Right ear irrigation completed

## 2022-06-20 ENCOUNTER — Other Ambulatory Visit: Payer: Self-pay | Admitting: Family Medicine

## 2022-06-20 DIAGNOSIS — E039 Hypothyroidism, unspecified: Secondary | ICD-10-CM

## 2022-06-20 DIAGNOSIS — Z794 Long term (current) use of insulin: Secondary | ICD-10-CM

## 2022-06-20 LAB — CMP14+EGFR
ALT: 18 IU/L (ref 0–32)
AST: 19 IU/L (ref 0–40)
Albumin/Globulin Ratio: 1.8 (ref 1.2–2.2)
Albumin: 4.3 g/dL (ref 3.7–4.7)
Alkaline Phosphatase: 118 IU/L (ref 44–121)
BUN/Creatinine Ratio: 25 (ref 12–28)
BUN: 17 mg/dL (ref 8–27)
Bilirubin Total: 0.6 mg/dL (ref 0.0–1.2)
CO2: 24 mmol/L (ref 20–29)
Calcium: 10.7 mg/dL — ABNORMAL HIGH (ref 8.7–10.3)
Chloride: 102 mmol/L (ref 96–106)
Creatinine, Ser: 0.67 mg/dL (ref 0.57–1.00)
Globulin, Total: 2.4 g/dL (ref 1.5–4.5)
Glucose: 112 mg/dL — ABNORMAL HIGH (ref 70–99)
Potassium: 4.4 mmol/L (ref 3.5–5.2)
Sodium: 140 mmol/L (ref 134–144)
Total Protein: 6.7 g/dL (ref 6.0–8.5)
eGFR: 87 mL/min/{1.73_m2} (ref >59)

## 2022-06-20 LAB — LIPID PANEL
Chol/HDL Ratio: 2.2 ratio (ref 0.0–4.4)
Cholesterol, Total: 153 mg/dL (ref 100–199)
HDL: 71 mg/dL (ref >39)
LDL Chol Calc (NIH): 66 mg/dL (ref 0–99)
Triglycerides: 83 mg/dL (ref 0–149)
VLDL Cholesterol Cal: 16 mg/dL (ref 5–40)

## 2022-06-20 LAB — CBC WITH DIFFERENTIAL/PLATELET
Basophils Absolute: 0.1 10*3/uL (ref 0.0–0.2)
Basos: 1 %
EOS (ABSOLUTE): 0.2 10*3/uL (ref 0.0–0.4)
Eos: 2 %
Hematocrit: 37.4 % (ref 34.0–46.6)
Hemoglobin: 12.1 g/dL (ref 11.1–15.9)
Immature Grans (Abs): 0 10*3/uL (ref 0.0–0.1)
Immature Granulocytes: 0 %
Lymphocytes Absolute: 2.6 10*3/uL (ref 0.7–3.1)
Lymphs: 34 %
MCH: 27.7 pg (ref 26.6–33.0)
MCHC: 32.4 g/dL (ref 31.5–35.7)
MCV: 86 fL (ref 79–97)
Monocytes Absolute: 0.6 10*3/uL (ref 0.1–0.9)
Monocytes: 9 %
Neutrophils Absolute: 4.1 10*3/uL (ref 1.4–7.0)
Neutrophils: 54 %
Platelets: 311 10*3/uL (ref 150–450)
RBC: 4.37 x10E6/uL (ref 3.77–5.28)
RDW: 13.7 % (ref 11.7–15.4)
WBC: 7.5 10*3/uL (ref 3.4–10.8)

## 2022-06-20 LAB — TSH+FREE T4
Free T4: 2.08 ng/dL — ABNORMAL HIGH (ref 0.82–1.77)
TSH: 0.295 u[IU]/mL — ABNORMAL LOW (ref 0.450–4.500)

## 2022-06-20 LAB — HEMOGLOBIN A1C
Est. average glucose Bld gHb Est-mCnc: 151 mg/dL
Hgb A1c MFr Bld: 6.9 % — ABNORMAL HIGH (ref 4.8–5.6)

## 2022-06-20 LAB — VITAMIN D 25 HYDROXY (VIT D DEFICIENCY, FRACTURES): Vit D, 25-Hydroxy: 46.6 ng/mL (ref 30.0–100.0)

## 2022-06-20 MED ORDER — TRULICITY 1.5 MG/0.5ML ~~LOC~~ SOAJ: 1.5000 mg | SUBCUTANEOUS | 1 refills | Status: DC

## 2022-06-20 MED ORDER — LEVOTHYROXINE SODIUM 75 MCG PO TABS: 75.0000 ug | ORAL_TABLET | Freq: Every day | ORAL | 3 refills | Status: DC

## 2022-06-20 NOTE — Progress Notes (Signed)
  Please return for labs in 6 weeks on Aug 01, 2022, to assess your thyroid levels. I have decreased your Synthroid to 75 mcg daily. Your thyroid levels were elevated. Your hemoglobin A1c is 6.9. I have increased your Trulicity to 1.5 mg weekly. The prescriptions are sent to your pharmacy. All other labs are stable

## 2022-06-24 ENCOUNTER — Other Ambulatory Visit: Payer: Self-pay | Admitting: Internal Medicine

## 2022-08-03 ENCOUNTER — Telehealth: Payer: Self-pay | Admitting: Family Medicine

## 2022-08-03 NOTE — Telephone Encounter (Signed)
Pt called and requested refills  glucose blood (FREESTYLE LITE) test strip [[id number]]

## 2022-08-06 ENCOUNTER — Other Ambulatory Visit: Payer: Self-pay

## 2022-08-06 MED ORDER — FREESTYLE LITE TEST VI STRP: ORAL_STRIP | 3 refills | Status: DC

## 2022-08-06 NOTE — Telephone Encounter (Signed)
Refills sent. Patient spouse aware.

## 2022-08-29 ENCOUNTER — Telehealth: Payer: Self-pay | Admitting: Family Medicine

## 2022-08-29 ENCOUNTER — Other Ambulatory Visit: Payer: Self-pay

## 2022-08-29 DIAGNOSIS — E1169 Type 2 diabetes mellitus with other specified complication: Secondary | ICD-10-CM

## 2022-08-29 LAB — HM DIABETES EYE EXAM

## 2022-08-29 MED ORDER — METFORMIN HCL 500 MG PO TABS
500.0000 mg | ORAL_TABLET | Freq: Two times a day (BID) | ORAL | 7 refills | Status: DC
Start: 2022-08-29 — End: 2022-10-24

## 2022-08-29 NOTE — Telephone Encounter (Signed)
Refill sent.

## 2022-08-29 NOTE — Telephone Encounter (Signed)
Prescription Request  08/29/2022  LOV: 06/19/2022  What is the name of the medication or equipment? metFORMIN (GLUCOPHAGE) 500 MG tablet [[id number]  Pharmacy   Have you contacted your pharmacy to request a refill? Yes   Which pharmacy would you like this sent to?   Pharmacy  [facility] - [place], [place] - [address] [place] [address] Phone: [phone]  Fax: [phone]   Patient notified that their request is being sent to the clinical staff for review and that they should receive a response within 2 business days.   Please advise at  came by office

## 2022-09-06 ENCOUNTER — Encounter (INDEPENDENT_AMBULATORY_CARE_PROVIDER_SITE_OTHER): Payer: Medicare Other | Admitting: Ophthalmology

## 2022-10-04 ENCOUNTER — Other Ambulatory Visit: Payer: Self-pay | Admitting: Family Medicine

## 2022-10-04 DIAGNOSIS — Z794 Long term (current) use of insulin: Secondary | ICD-10-CM

## 2022-10-22 ENCOUNTER — Ambulatory Visit (INDEPENDENT_AMBULATORY_CARE_PROVIDER_SITE_OTHER): Payer: Medicare Other | Admitting: Family Medicine

## 2022-10-22 ENCOUNTER — Encounter: Payer: Self-pay | Admitting: Family Medicine

## 2022-10-22 VITALS — BP 135/76 | HR 88 | Ht 64.0 in | Wt 123.1 lb

## 2022-10-22 DIAGNOSIS — E1169 Type 2 diabetes mellitus with other specified complication: Secondary | ICD-10-CM | POA: Diagnosis not present

## 2022-10-22 DIAGNOSIS — E038 Other specified hypothyroidism: Secondary | ICD-10-CM

## 2022-10-22 DIAGNOSIS — I10 Essential (primary) hypertension: Secondary | ICD-10-CM | POA: Diagnosis not present

## 2022-10-22 DIAGNOSIS — R7301 Impaired fasting glucose: Secondary | ICD-10-CM

## 2022-10-22 DIAGNOSIS — E559 Vitamin D deficiency, unspecified: Secondary | ICD-10-CM

## 2022-10-22 DIAGNOSIS — E7849 Other hyperlipidemia: Secondary | ICD-10-CM | POA: Diagnosis not present

## 2022-10-22 NOTE — Assessment & Plan Note (Signed)
Controlled Encouraged to continue taking amlodipine 5 mg, metoprolol 50 mg,  and MICARDIS HCT 40-12.5 mg daily Low-sodium diet with increased physical activity encourage Asymptomatic today in the clinic Pending CBC and CMP today BP Readings from Last 3 Encounters:  10/22/22 135/76  06/19/22 138/72  02/13/22 123/66

## 2022-10-22 NOTE — Assessment & Plan Note (Signed)
Denies polyuria, polyphagia, and polydipsia She takes metformin 500 mg twice daily and Trulicity 1.5 subcu weekly injection Encouraged to decrease her intake of high sugar foods and beverages with increased physical activity Pending hga1c Lab Results  Component Value Date   HGBA1C 6.9 (H) 06/19/2022

## 2022-10-22 NOTE — Progress Notes (Signed)
Established Patient Office Visit  Subjective:  Patient ID: Lisa Beck, female    DOB: 01-18-1941  Age: 82 y.o. MRN: [id number]  CC:  Chief Complaint  Patient presents with   Care Management    4 month f/u    HPI Lisa Beck is a 82 y.o. female with past medical history of hypertension, type 2 diabetes, hyperlipidemia, hypothyroidism presents for f/u of  chronic medical conditions. For the details of today's visit, please refer to the assessment and plan.     Past Medical History:  Diagnosis Date   Arthritis    Diabetes mellitus without complication (HCC)    History of kidney stones    HOH (hard of hearing)    Hyperlipidemia    Hypertension    Hypothyroidism     Past Surgical History:  Procedure Laterality Date   COLONOSCOPY  2007   Dr. Karilyn Cota: diverticulosis   COLONOSCOPY WITH PROPOFOL N/A 04/24/2019   Dr. Jena Gauss: Diverticulosis in the sigmoid colon, two 5 to 9 mm polyps removed from the descending and ascending colon.  Pathology revealed both to be benign polyps with no adenomatous changes. No future surveillance colonoscopies due to age.   ESOPHAGOGASTRODUODENOSCOPY (EGD) WITH PROPOFOL N/A 04/24/2019   Dr. Jena Gauss: Small hiatal hernia, one gastric polyp benign.   POLYPECTOMY  04/24/2019   Procedure: POLYPECTOMY;  Surgeon: Corbin Ade, MD;  Location: [facility];  Service: Endoscopy;;  ascending;   REVERSE SHOULDER ARTHROPLASTY Right 02/16/2021   Procedure: REVERSE SHOULDER ARTHROPLASTY;  Surgeon: Oliver Barre, MD;  Location: [facility];  Service: Orthopedics;  Laterality: Right;    Family History  Problem Relation Age of Onset   Breast cancer Mother    CAD Father        MI sometime in 72, age unknown   CAD Brother        Pt has 3 brothers that died of MIs between ages 14s-70s   CAD Brother    CAD Brother    Colon cancer Neg Hx    Ulcers Neg Hx     Social History   Socioeconomic History   Marital status: Married    Spouse name: Not on file    Number of children: Not on file   Years of education: Not on file   Highest education level: Not on file  Occupational History   Not on file  Tobacco Use   Smoking status: Never   Smokeless tobacco: Never  Vaping Use   Vaping status: Never Used  Substance and Sexual Activity   Alcohol use: No   Drug use: No   Sexual activity: Not Currently  Other Topics Concern   Not on file  Social History Narrative   Not on file   Social Determinants of Health   Financial Resource Strain: Low Risk  (01/24/2021)   Overall Financial Resource Strain (CARDIA)    Difficulty of Paying Living Expenses: Not hard at all  Food Insecurity: No Food Insecurity (01/24/2021)   Hunger Vital Sign    Worried About Running Out of Food in the Last Year: Never true    Ran Out of Food in the Last Year: Never true  Transportation Needs: No Transportation Needs (01/24/2021)   PRAPARE - Administrator, Civil Service (Medical): No    Lack of Transportation (Non-Medical): No  Physical Activity: Sufficiently Active (01/24/2021)   Exercise Vital Sign    Days of Exercise per Week: 5 days    Minutes of  Exercise per Session: 60 min  Stress: No Stress Concern Present (01/24/2021)   [employer] of Occupational Health - Occupational Stress Questionnaire    Feeling of Stress : Not at all  Social Connections: Moderately Isolated (01/24/2021)   Social Connection and Isolation Panel [NHANES]    Frequency of Communication with Friends and Family: Twice a week    Frequency of Social Gatherings with Friends and Family: Twice a week    Attends Religious Services: Never    Database administrator or Organizations: No    Attends Banker Meetings: Never    Marital Status: Married  Catering manager Violence: Not At Risk (01/24/2021)   Humiliation, Afraid, Rape, and Kick questionnaire    Fear of Current or Ex-Partner: No    Emotionally Abused: No    Physically Abused: No    Sexually Abused: No     Outpatient Medications Prior to Visit  Medication Sig Dispense Refill   amLODipine (NORVASC) 5 MG tablet TAKE 1 TABLET DAILY 90 tablet 3   aspirin EC 81 MG tablet Take 81 mg by mouth daily.      atorvastatin (LIPITOR) 40 MG tablet TAKE 1 TABLET EVERY EVENING 90 tablet 3   carbamide peroxide (DEBROX) 6.5 % OTIC solution Place 5 drops into both ears 2 (two) times daily. 15 mL 0   Dulaglutide (TRULICITY) 1.5 MG/0.5ML SOPN Inject 1.5 mg into the skin once a week. 2 mL 1   glucose blood (FREESTYLE LITE) test strip USE TO TEST BLOOD SUGAR DAILY 100 strip 3   LANTUS SOLOSTAR 100 UNIT/ML Solostar Pen INJECT 22 UNITS UNDER THE SKIN AT BEDTIME 15 mL 4   levothyroxine (SYNTHROID) 75 MCG tablet Take 1 tablet (75 mcg total) by mouth daily. 90 tablet 3   metFORMIN (GLUCOPHAGE) 500 MG tablet Take 1 tablet (500 mg total) by mouth 2 (two) times daily. 90 tablet 7   metoprolol succinate (TOPROL-XL) 50 MG 24 hr tablet Take 1 tablet (50 mg total) by mouth daily. Take with or immediately following a meal. 90 tablet 3   nitrofurantoin, macrocrystal-monohydrate, (MACROBID) 100 MG capsule Take 1 capsule (100 mg total) by mouth 2 (two) times daily. 14 capsule 0   ondansetron (ZOFRAN-ODT) 8 MG disintegrating tablet 8mg  ODT q4 hours prn nausea 10 tablet 0   telmisartan-hydrochlorothiazide (MICARDIS HCT) 40-12.5 MG tablet Take 1 tablet by mouth daily. 90 tablet 3   VITAMIN D PO Take by mouth daily.     No facility-administered medications prior to visit.    No Known Allergies  ROS Review of Systems  Constitutional:  Negative for chills and fever.  Eyes:  Negative for visual disturbance.  Respiratory:  Negative for chest tightness and shortness of breath.   Neurological:  Negative for dizziness and headaches.      Objective:    Physical Exam HENT:     Head: Normocephalic.     Mouth/Throat:     Mouth: Mucous membranes are moist.  Cardiovascular:     Rate and Rhythm: Normal rate.     Heart sounds:  Normal heart sounds.  Pulmonary:     Effort: Pulmonary effort is normal.     Breath sounds: Normal breath sounds.  Neurological:     Mental Status: She is alert.     BP 135/76   Pulse 88   Ht 5\' 4"  (1.626 m)   Wt 123 lb 1.3 oz (55.8 kg)   SpO2 96%   BMI 21.13 kg/m  Wt Readings from  Last 3 Encounters:  10/22/22 123 lb 1.3 oz (55.8 kg)  06/19/22 123 lb (55.8 kg)  02/13/22 123 lb (55.8 kg)    Lab Results  Component Value Date   TSH 0.295 (L) 06/19/2022   Lab Results  Component Value Date   WBC 7.5 06/19/2022   HGB 12.1 06/19/2022   HCT 37.4 06/19/2022   MCV 86 06/19/2022   PLT 311 06/19/2022   Lab Results  Component Value Date   NA 140 06/19/2022   K 4.4 06/19/2022   CO2 24 06/19/2022   GLUCOSE 112 (H) 06/19/2022   BUN 17 06/19/2022   CREATININE 0.67 06/19/2022   BILITOT 0.6 06/19/2022   ALKPHOS 118 06/19/2022   AST 19 06/19/2022   ALT 18 06/19/2022   PROT 6.7 06/19/2022   ALBUMIN 4.3 06/19/2022   CALCIUM 10.7 (H) 06/19/2022   ANIONGAP 8 09/22/2021   EGFR 87 06/19/2022   Lab Results  Component Value Date   CHOL 153 06/19/2022   Lab Results  Component Value Date   HDL 71 06/19/2022   Lab Results  Component Value Date   LDLCALC 66 06/19/2022   Lab Results  Component Value Date   TRIG 83 06/19/2022   Lab Results  Component Value Date   CHOLHDL 2.2 06/19/2022   Lab Results  Component Value Date   HGBA1C 6.9 (H) 06/19/2022      Assessment & Plan:  Type 2 diabetes mellitus with other specified complication, without long-term current use of insulin (HCC) Assessment & Plan: Denies polyuria, polyphagia, and polydipsia She takes metformin 500 mg twice daily and Trulicity 1.5 subcu weekly injection Encouraged to decrease her intake of high sugar foods and beverages with increased physical activity Pending hga1c Lab Results  Component Value Date   HGBA1C 6.9 (H) 06/19/2022        Other specified hypothyroidism Assessment & Plan: She  takes Synthroid 75 mcg daily on an empty stomach No complaints or concerns voiced Encouraged to continue therapy Will assess her thyroid levels today Lab Results  Component Value Date   TSH 0.295 (L) 06/19/2022     Orders: -     TSH + free T4  Other hyperlipidemia Assessment & Plan: She takes atorvastatin 40 mg daily No muscle aches or pain reported Encouraged to increase your intake of foods rich in fruits, vegetables, whole grains Lean proteins: chicken, fish, beans, legumes Low Fat dairy products Reduced intake of saturated fats, trans fatty acids, cholesterol Aim to be active at least 5 days a week for 30 minutes each day ( walking briskly)  Lab Results  Component Value Date   CHOL 153 06/19/2022   HDL 71 06/19/2022   LDLCALC 66 06/19/2022   TRIG 83 06/19/2022   CHOLHDL 2.2 06/19/2022     Orders: -     Lipid panel -     CMP14+EGFR -     CBC with Differential/Platelet  Primary hypertension Assessment & Plan: Controlled Encouraged to continue taking amlodipine 5 mg, metoprolol 50 mg,  and MICARDIS HCT 40-12.5 mg daily Low-sodium diet with increased physical activity encourage Asymptomatic today in the clinic Pending CBC and CMP today BP Readings from Last 3 Encounters:  10/22/22 135/76  06/19/22 138/72  02/13/22 123/66      Vitamin D deficiency -     VITAMIN D 25 Hydroxy (Vit-D Deficiency, Fractures)  IFG (impaired fasting glucose) -     Hemoglobin A1c  Note: This chart has been completed using [employer],  and while attempts have been made to ensure accuracy, certain words and phrases may not be transcribed as intended.    Follow-up: Return in about 4 months (around 02/21/2023).   Gilmore Laroche, FNP

## 2022-10-22 NOTE — Assessment & Plan Note (Addendum)
She takes Synthroid 75 mcg daily on an empty stomach No complaints or concerns voiced Encouraged to continue therapy Will assess her thyroid levels today Lab Results  Component Value Date   TSH 0.295 (L) 06/19/2022

## 2022-10-22 NOTE — Assessment & Plan Note (Addendum)
She takes atorvastatin 40 mg daily No muscle aches or pain reported Encouraged to increase your intake of foods rich in fruits, vegetables, whole grains Lean proteins: chicken, fish, beans, legumes Low Fat dairy products Reduced intake of saturated fats, trans fatty acids, cholesterol Aim to be active at least 5 days a week for 30 minutes each day ( walking briskly)  Lab Results  Component Value Date   CHOL 153 06/19/2022   HDL 71 06/19/2022   LDLCALC 66 06/19/2022   TRIG 83 06/19/2022   CHOLHDL 2.2 06/19/2022

## 2022-10-22 NOTE — Patient Instructions (Signed)
I appreciate the opportunity to provide care to you today!    Follow up:  4 months  Labs: please stop by the lab today to get your blood drawn (CBC, CMP, TSH, Lipid profile, HgA1c, Vit D)   Attached with your AVS, you will find valuable resources for self-education. I highly recommend dedicating some time to thoroughly examine them.   Please continue to a heart-healthy diet and increase your physical activities. Try to exercise for 30mins at least five days a week.    It was a pleasure to see you and I look forward to continuing to work together on your health and well-being. Please do not hesitate to call the office if you need care or have questions about your care.  In case of emergency, please visit the Emergency Department for urgent care, or contact our clinic at [phone] to schedule an appointment. We're here to help you!   Have a wonderful day and week. With Gratitude,   MSN, FNP-BC  

## 2022-10-24 ENCOUNTER — Other Ambulatory Visit: Payer: Self-pay | Admitting: Family Medicine

## 2022-10-24 DIAGNOSIS — E1169 Type 2 diabetes mellitus with other specified complication: Secondary | ICD-10-CM

## 2022-10-24 DIAGNOSIS — E039 Hypothyroidism, unspecified: Secondary | ICD-10-CM

## 2022-10-24 MED ORDER — METFORMIN HCL 1000 MG PO TABS
1000.0000 mg | ORAL_TABLET | Freq: Two times a day (BID) | ORAL | 3 refills | Status: DC
Start: 2022-10-24 — End: 2024-02-06

## 2022-10-24 NOTE — Progress Notes (Signed)
Please inform the patient that her hemoglobin A1c has increased from 6.9% to 7.2%. I recommend implementing lifestyle changes, including reducing her intake of high-sugar foods and beverages and increasing physical activity. I have adjusted her Metformin dosage to 1000 mg twice daily, and the prescription has been sent to her pharmacy. Please advise the patient to return for lab work to assess her thyroid levels in 6 weeks, during the week of December 03, 2022, as her current labs indicate subclinical hyperthyroidism.

## 2022-12-05 ENCOUNTER — Other Ambulatory Visit: Payer: Self-pay | Admitting: Family Medicine

## 2023-01-16 ENCOUNTER — Other Ambulatory Visit (HOSPITAL_COMMUNITY): Payer: Self-pay | Admitting: Family Medicine

## 2023-01-16 DIAGNOSIS — Z1231 Encounter for screening mammogram for malignant neoplasm of breast: Secondary | ICD-10-CM

## 2023-02-18 ENCOUNTER — Ambulatory Visit (HOSPITAL_COMMUNITY)
Admission: RE | Admit: 2023-02-18 | Discharge: 2023-02-18 | Disposition: A | Discharge status: Home / Self Care | Payer: Medicare Other | Source: Ambulatory Visit | Attending: Family Medicine | Admitting: Family Medicine

## 2023-02-18 DIAGNOSIS — Z1231 Encounter for screening mammogram for malignant neoplasm of breast: Secondary | ICD-10-CM | POA: Diagnosis present

## 2023-02-21 ENCOUNTER — Encounter: Payer: Self-pay | Admitting: Family Medicine

## 2023-02-21 ENCOUNTER — Ambulatory Visit (INDEPENDENT_AMBULATORY_CARE_PROVIDER_SITE_OTHER): Payer: Medicare Other | Admitting: Family Medicine

## 2023-02-21 VITALS — BP 132/72 | HR 82 | Ht 64.0 in | Wt 120.1 lb

## 2023-02-21 DIAGNOSIS — Z7984 Long term (current) use of oral hypoglycemic drugs: Secondary | ICD-10-CM

## 2023-02-21 DIAGNOSIS — E038 Other specified hypothyroidism: Secondary | ICD-10-CM | POA: Diagnosis not present

## 2023-02-21 DIAGNOSIS — E1169 Type 2 diabetes mellitus with other specified complication: Secondary | ICD-10-CM

## 2023-02-21 DIAGNOSIS — I10 Essential (primary) hypertension: Secondary | ICD-10-CM

## 2023-02-21 DIAGNOSIS — H6123 Impacted cerumen, bilateral: Secondary | ICD-10-CM

## 2023-02-21 DIAGNOSIS — E7849 Other hyperlipidemia: Secondary | ICD-10-CM

## 2023-02-21 DIAGNOSIS — E559 Vitamin D deficiency, unspecified: Secondary | ICD-10-CM

## 2023-02-21 NOTE — Patient Instructions (Signed)
I appreciate the opportunity to provide care to you today!    Follow up:  4 months  Labs: please stop by the lab today to get your blood drawn (CBC, CMP, TSH, Lipid profile, HgA1c, Vit D)   Attached with your AVS, you will find valuable resources for self-education. I highly recommend dedicating some time to thoroughly examine them.   Please continue to a heart-healthy diet and increase your physical activities. Try to exercise for 30mins at least five days a week.    It was a pleasure to see you and I look forward to continuing to work together on your health and well-being. Please do not hesitate to call the office if you need care or have questions about your care.  In case of emergency, please visit the Emergency Department for urgent care, or contact our clinic at [phone] to schedule an appointment. We're here to help you!   Have a wonderful day and week. With Gratitude, Kylan Veach MSN, FNP-BC  

## 2023-02-21 NOTE — Assessment & Plan Note (Signed)
Controlled Encouraged to continue taking amlodipine 5 mg, metoprolol 50 mg,  and MICARDIS HCT 40-12.5 mg daily Low-sodium diet with increased physical activity encouraged Asymptomatic today in the clinic Pending CBC and CMP today BP Readings from Last 3 Encounters:  02/21/23 132/72  10/22/22 135/76  06/19/22 138/72

## 2023-02-21 NOTE — Assessment & Plan Note (Signed)
Ear irrigation completed Encouraged to use over-the-counter Debrox earwax removal

## 2023-02-21 NOTE — Progress Notes (Signed)
Established Patient Office Visit  Subjective:  Patient ID: Lisa Beck, female    DOB: July 11, 1940  Age: 82 y.o. MRN: [id number]  CC:  Chief Complaint  Patient presents with   Care Management    Follow up     HPI ARIADNNE Beck is a 82 y.o. female with past medical history of type 2 diabetes, hypothyroidism, hypertension, and hyperlipidemia presents for f/u of  chronic medical conditions. For the details of today's visit, please refer to the assessment and plan.     Past Medical History:  Diagnosis Date   Arthritis    Diabetes mellitus without complication (HCC)    History of kidney stones    HOH (hard of hearing)    Hyperlipidemia    Hypertension    Hypothyroidism     Past Surgical History:  Procedure Laterality Date   COLONOSCOPY  2007   Dr. Karilyn Cota: diverticulosis   COLONOSCOPY WITH PROPOFOL N/A 04/24/2019   Dr. Jena Gauss: Diverticulosis in the sigmoid colon, two 5 to 9 mm polyps removed from the descending and ascending colon.  Pathology revealed both to be benign polyps with no adenomatous changes. No future surveillance colonoscopies due to age.   ESOPHAGOGASTRODUODENOSCOPY (EGD) WITH PROPOFOL N/A 04/24/2019   Dr. Jena Gauss: Small hiatal hernia, one gastric polyp benign.   POLYPECTOMY  04/24/2019   Procedure: POLYPECTOMY;  Surgeon: Corbin Ade, MD;  Location: [facility];  Service: Endoscopy;;  ascending;   REVERSE SHOULDER ARTHROPLASTY Right 02/16/2021   Procedure: REVERSE SHOULDER ARTHROPLASTY;  Surgeon: Oliver Barre, MD;  Location: [facility];  Service: Orthopedics;  Laterality: Right;    Family History  Problem Relation Age of Onset   Breast cancer Mother    CAD Father        MI sometime in 76, age unknown   CAD Brother        Pt has 3 brothers that died of MIs between ages 75s-70s   CAD Brother    CAD Brother    Colon cancer Neg Hx    Ulcers Neg Hx     Social History   Socioeconomic History   Marital status: Married    Spouse name: Not on file    Number of children: Not on file   Years of education: Not on file   Highest education level: Not on file  Occupational History   Not on file  Tobacco Use   Smoking status: Never   Smokeless tobacco: Never  Vaping Use   Vaping status: Never Used  Substance and Sexual Activity   Alcohol use: No   Drug use: No   Sexual activity: Not Currently  Other Topics Concern   Not on file  Social History Narrative   Not on file   Social Determinants of Health   Financial Resource Strain: Low Risk  (01/24/2021)   Overall Financial Resource Strain (CARDIA)    Difficulty of Paying Living Expenses: Not hard at all  Food Insecurity: No Food Insecurity (01/24/2021)   Hunger Vital Sign    Worried About Running Out of Food in the Last Year: Never true    Ran Out of Food in the Last Year: Never true  Transportation Needs: No Transportation Needs (01/24/2021)   PRAPARE - Administrator, Civil Service (Medical): No    Lack of Transportation (Non-Medical): No  Physical Activity: Sufficiently Active (01/24/2021)   Exercise Vital Sign    Days of Exercise per Week: 5 days    Minutes  of Exercise per Session: 60 min  Stress: No Stress Concern Present (01/24/2021)   [employer] of Occupational Health - Occupational Stress Questionnaire    Feeling of Stress : Not at all  Social Connections: Moderately Isolated (01/24/2021)   Social Connection and Isolation Panel [NHANES]    Frequency of Communication with Friends and Family: Twice a week    Frequency of Social Gatherings with Friends and Family: Twice a week    Attends Religious Services: Never    Database administrator or Organizations: No    Attends Banker Meetings: Never    Marital Status: Married  Catering manager Violence: Not At Risk (01/24/2021)   Humiliation, Afraid, Rape, and Kick questionnaire    Fear of Current or Ex-Partner: No    Emotionally Abused: No    Physically Abused: No    Sexually Abused: No     Outpatient Medications Prior to Visit  Medication Sig Dispense Refill   amLODipine (NORVASC) 5 MG tablet TAKE 1 TABLET DAILY 90 tablet 3   aspirin EC 81 MG tablet Take 81 mg by mouth daily.      atorvastatin (LIPITOR) 40 MG tablet TAKE 1 TABLET EVERY EVENING 90 tablet 3   carbamide peroxide (DEBROX) 6.5 % OTIC solution Place 5 drops into both ears 2 (two) times daily. 15 mL 0   Dulaglutide (TRULICITY) 1.5 MG/0.5ML SOPN Inject 1.5 mg into the skin once a week. 2 mL 1   glucose blood (FREESTYLE LITE) test strip USE TO TEST BLOOD SUGAR DAILY 100 strip 3   LANTUS SOLOSTAR 100 UNIT/ML Solostar Pen INJECT 22 UNITS UNDER THE SKIN AT BEDTIME 15 mL 4   levothyroxine (SYNTHROID) 75 MCG tablet Take 1 tablet (75 mcg total) by mouth daily. 90 tablet 3   metFORMIN (GLUCOPHAGE) 1000 MG tablet Take 1 tablet (1,000 mg total) by mouth 2 (two) times daily with a meal. 180 tablet 3   metoprolol succinate (TOPROL-XL) 50 MG 24 hr tablet Take 1 tablet (50 mg total) by mouth daily. Take with or immediately following a meal. 90 tablet 3   nitrofurantoin, macrocrystal-monohydrate, (MACROBID) 100 MG capsule Take 1 capsule (100 mg total) by mouth 2 (two) times daily. 14 capsule 0   ondansetron (ZOFRAN-ODT) 8 MG disintegrating tablet 8mg  ODT q4 hours prn nausea 10 tablet 0   telmisartan-hydrochlorothiazide (MICARDIS HCT) 40-12.5 MG tablet Take 1 tablet by mouth daily. 90 tablet 3   VITAMIN D PO Take by mouth daily.     No facility-administered medications prior to visit.    No Known Allergies  ROS Review of Systems  Constitutional:  Negative for chills and fever.  HENT:         Right ear ache with muffled hearing  Eyes:  Negative for visual disturbance.  Respiratory:  Negative for chest tightness and shortness of breath.   Neurological:  Negative for dizziness and headaches.      Objective:    Physical Exam HENT:     Head: Normocephalic.     Right Ear: There is impacted cerumen.     Left Ear: There  is impacted cerumen.     Mouth/Throat:     Mouth: Mucous membranes are moist.  Cardiovascular:     Rate and Rhythm: Normal rate.     Heart sounds: Normal heart sounds.  Pulmonary:     Effort: Pulmonary effort is normal.     Breath sounds: Normal breath sounds.  Neurological:     Mental Status: She is  alert.     BP 132/72   Pulse 82   Ht 5\' 4"  (1.626 m)   Wt 120 lb 1.3 oz (54.5 kg)   SpO2 98%   BMI 20.61 kg/m  Wt Readings from Last 3 Encounters:  02/21/23 120 lb 1.3 oz (54.5 kg)  10/22/22 123 lb 1.3 oz (55.8 kg)  06/19/22 123 lb (55.8 kg)    Lab Results  Component Value Date   TSH 4.320 12/03/2022   Lab Results  Component Value Date   WBC 8.4 10/22/2022   HGB 12.3 10/22/2022   HCT 36.9 10/22/2022   MCV 84 10/22/2022   PLT 349 10/22/2022   Lab Results  Component Value Date   NA 136 10/22/2022   K 4.6 10/22/2022   CO2 23 10/22/2022   GLUCOSE 111 (H) 10/22/2022   BUN 13 10/22/2022   CREATININE 0.75 10/22/2022   BILITOT 0.7 10/22/2022   ALKPHOS 108 10/22/2022   AST 18 10/22/2022   ALT 16 10/22/2022   PROT 6.8 10/22/2022   ALBUMIN 4.4 10/22/2022   CALCIUM 10.5 (H) 10/22/2022   ANIONGAP 8 09/22/2021   EGFR 79 10/22/2022   Lab Results  Component Value Date   CHOL 147 10/22/2022   Lab Results  Component Value Date   HDL 75 10/22/2022   Lab Results  Component Value Date   LDLCALC 56 10/22/2022   Lab Results  Component Value Date   TRIG 87 10/22/2022   Lab Results  Component Value Date   CHOLHDL 2.0 10/22/2022   Lab Results  Component Value Date   HGBA1C 7.2 (H) 10/22/2022      Assessment & Plan:  Type 2 diabetes mellitus with other specified complication, without long-term current use of insulin (HCC) Assessment & Plan: Denies polyuria, polyphagia, and polydipsia She takes metformin 500 mg twice daily and Trulicity 1.5 subcu weekly injection Encouraged to decrease her intake of high sugar foods and beverages with increased physical  activity Pending hga1c Lab Results  Component Value Date   HGBA1C 7.2 (H) 10/22/2022       Orders: -     Microalbumin / creatinine urine ratio -     HM Diabetes Foot Exam -     Hemoglobin A1c  Primary hypertension Assessment & Plan: Controlled Encouraged to continue taking amlodipine 5 mg, metoprolol 50 mg,  and MICARDIS HCT 40-12.5 mg daily Low-sodium diet with increased physical activity encouraged Asymptomatic today in the clinic Pending CBC and CMP today BP Readings from Last 3 Encounters:  02/21/23 132/72  10/22/22 135/76  06/19/22 138/72      Other specified hypothyroidism Assessment & Plan: She takes Synthroid 75 mcg daily on an empty stomach No complaints or concerns voiced Pending thyroid levels Lab Results  Component Value Date   TSH 4.320 12/03/2022      Impacted cerumen of both ears Assessment & Plan: Ear irrigation completed Encouraged to use over-the-counter Debrox earwax removal    Vitamin D deficiency -     VITAMIN D 25 Hydroxy (Vit-D Deficiency, Fractures)  TSH (thyroid-stimulating hormone deficiency) -     TSH + free T4  Other hyperlipidemia -     Lipid panel -     CMP14+EGFR -     CBC with Differential/Platelet  Note: This chart has been completed using [employer] (consulting) software, and while attempts have been made to ensure accuracy, certain words and phrases may not be transcribed as intended.    Follow-up: Return in about 4 months (around  06/22/2023).   Gilmore Laroche, FNP

## 2023-02-21 NOTE — Assessment & Plan Note (Signed)
She takes Synthroid 75 mcg daily on an empty stomach No complaints or concerns voiced Pending thyroid levels Lab Results  Component Value Date   TSH 4.320 12/03/2022

## 2023-02-21 NOTE — Assessment & Plan Note (Signed)
Denies polyuria, polyphagia, and polydipsia She takes metformin 500 mg twice daily and Trulicity 1.5 subcu weekly injection Encouraged to decrease her intake of high sugar foods and beverages with increased physical activity Pending hga1c Lab Results  Component Value Date   HGBA1C 7.2 (H) 10/22/2022

## 2023-02-24 LAB — CMP14+EGFR
ALT: 15 [IU]/L (ref 0–32)
AST: 18 [IU]/L (ref 0–40)
Albumin: 4.4 g/dL (ref 3.7–4.7)
Alkaline Phosphatase: 91 [IU]/L (ref 44–121)
BUN/Creatinine Ratio: 20 (ref 12–28)
BUN: 14 mg/dL (ref 8–27)
Bilirubin Total: 0.6 mg/dL (ref 0.0–1.2)
CO2: 23 mmol/L (ref 20–29)
Calcium: 10.7 mg/dL — ABNORMAL HIGH (ref 8.7–10.3)
Chloride: 102 mmol/L (ref 96–106)
Creatinine, Ser: 0.7 mg/dL (ref 0.57–1.00)
Globulin, Total: 2.5 g/dL (ref 1.5–4.5)
Glucose: 87 mg/dL (ref 70–99)
Potassium: 4.3 mmol/L (ref 3.5–5.2)
Sodium: 138 mmol/L (ref 134–144)
Total Protein: 6.9 g/dL (ref 6.0–8.5)
eGFR: 86 mL/min/{1.73_m2} (ref >59)

## 2023-02-24 LAB — MICROALBUMIN / CREATININE URINE RATIO
Creatinine, Urine: 117.1 mg/dL
Microalb/Creat Ratio: 9 mg/g{creat} (ref 0–29)
Microalbumin, Urine: 10.3 ug/mL

## 2023-02-24 LAB — CBC WITH DIFFERENTIAL/PLATELET
Basophils Absolute: 0.1 10*3/uL (ref 0.0–0.2)
Basos: 1 %
EOS (ABSOLUTE): 0.1 10*3/uL (ref 0.0–0.4)
Eos: 1 %
Hematocrit: 37.7 % (ref 34.0–46.6)
Hemoglobin: 12.3 g/dL (ref 11.1–15.9)
Immature Grans (Abs): 0 10*3/uL (ref 0.0–0.1)
Immature Granulocytes: 0 %
Lymphocytes Absolute: 2.4 10*3/uL (ref 0.7–3.1)
Lymphs: 31 %
MCH: 28 pg (ref 26.6–33.0)
MCHC: 32.6 g/dL (ref 31.5–35.7)
MCV: 86 fL (ref 79–97)
Monocytes Absolute: 0.6 10*3/uL (ref 0.1–0.9)
Monocytes: 8 %
Neutrophils Absolute: 4.7 10*3/uL (ref 1.4–7.0)
Neutrophils: 59 %
Platelets: 311 10*3/uL (ref 150–450)
RBC: 4.39 x10E6/uL (ref 3.77–5.28)
RDW: 13.2 % (ref 11.7–15.4)
WBC: 7.9 10*3/uL (ref 3.4–10.8)

## 2023-02-24 LAB — LIPID PANEL
Chol/HDL Ratio: 1.9 {ratio} (ref 0.0–4.4)
Cholesterol, Total: 137 mg/dL (ref 100–199)
HDL: 71 mg/dL (ref >39)
LDL Chol Calc (NIH): 51 mg/dL (ref 0–99)
Triglycerides: 78 mg/dL (ref 0–149)
VLDL Cholesterol Cal: 15 mg/dL (ref 5–40)

## 2023-02-24 LAB — TSH+FREE T4
Free T4: 2.17 ng/dL — ABNORMAL HIGH (ref 0.82–1.77)
TSH: 2.22 u[IU]/mL (ref 0.450–4.500)

## 2023-02-24 LAB — VITAMIN D 25 HYDROXY (VIT D DEFICIENCY, FRACTURES): Vit D, 25-Hydroxy: 48.5 ng/mL (ref 30.0–100.0)

## 2023-02-24 LAB — HEMOGLOBIN A1C
Est. average glucose Bld gHb Est-mCnc: 148 mg/dL
Hgb A1c MFr Bld: 6.8 % — ABNORMAL HIGH (ref 4.8–5.6)

## 2023-04-03 ENCOUNTER — Other Ambulatory Visit: Payer: Self-pay | Admitting: Family Medicine

## 2023-04-03 DIAGNOSIS — E782 Mixed hyperlipidemia: Secondary | ICD-10-CM

## 2023-04-22 ENCOUNTER — Other Ambulatory Visit: Payer: Self-pay | Admitting: Internal Medicine

## 2023-05-06 ENCOUNTER — Other Ambulatory Visit: Payer: Self-pay | Admitting: Family Medicine

## 2023-05-06 DIAGNOSIS — E039 Hypothyroidism, unspecified: Secondary | ICD-10-CM

## 2023-05-13 ENCOUNTER — Other Ambulatory Visit: Payer: Self-pay | Admitting: Family Medicine

## 2023-05-13 ENCOUNTER — Telehealth: Payer: Self-pay | Admitting: Family Medicine

## 2023-05-13 DIAGNOSIS — E1169 Type 2 diabetes mellitus with other specified complication: Secondary | ICD-10-CM

## 2023-05-13 MED ORDER — TRULICITY 1.5 MG/0.5ML ~~LOC~~ SOAJ
1.5000 mg | SUBCUTANEOUS | 1 refills | Status: DC
Start: 2023-05-13 — End: 2023-07-26

## 2023-05-13 NOTE — Telephone Encounter (Signed)
 Patient came by the office to get a message to her provider.  Why did she stop the Trulicity and script will not renew it needs approval for refill again.   Pharmacy: [facility]

## 2023-05-13 NOTE — Telephone Encounter (Signed)
 Rx sent

## 2023-05-16 NOTE — Telephone Encounter (Signed)
 Pt. Notified by VM

## 2023-05-21 IMAGING — MG MM DIGITAL SCREENING BILAT W/ TOMO AND CAD
8 series · 9 of 24 positions shown · non-contrast
Comparison: Previous exam(s).

CLINICAL DATA: Screening.

EXAM:
DIGITAL SCREENING BILATERAL MAMMOGRAM WITH TOMOSYNTHESIS AND CAD
TECHNIQUE: Bilateral screening digital craniocaudal and mediolateral oblique
mammograms were obtained. Bilateral screening digital breast
tomosynthesis was performed. The images were evaluated with
computer-aided detection.

[R MLO synth-2D]
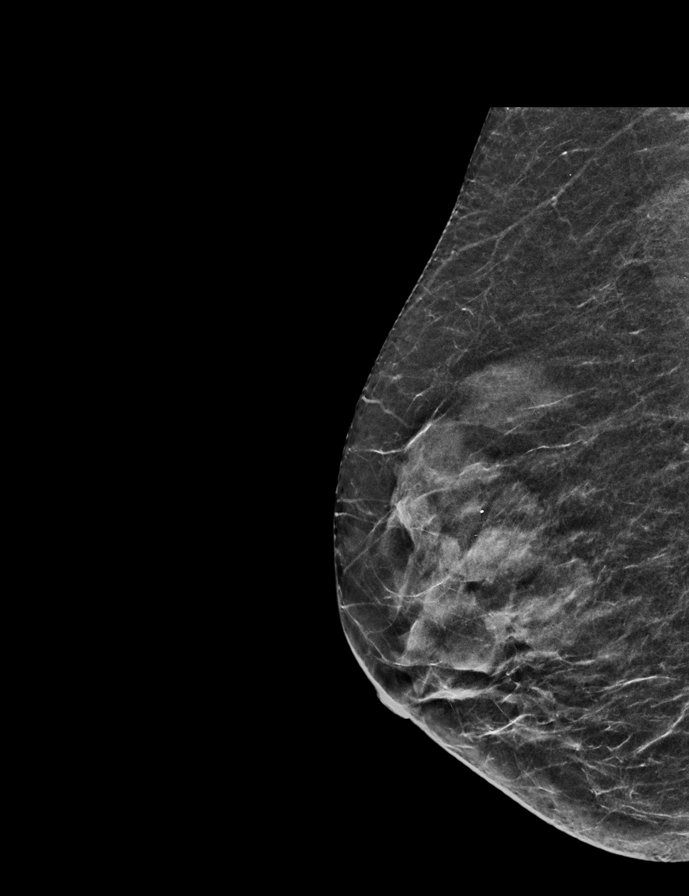

[L CC synth-2D]
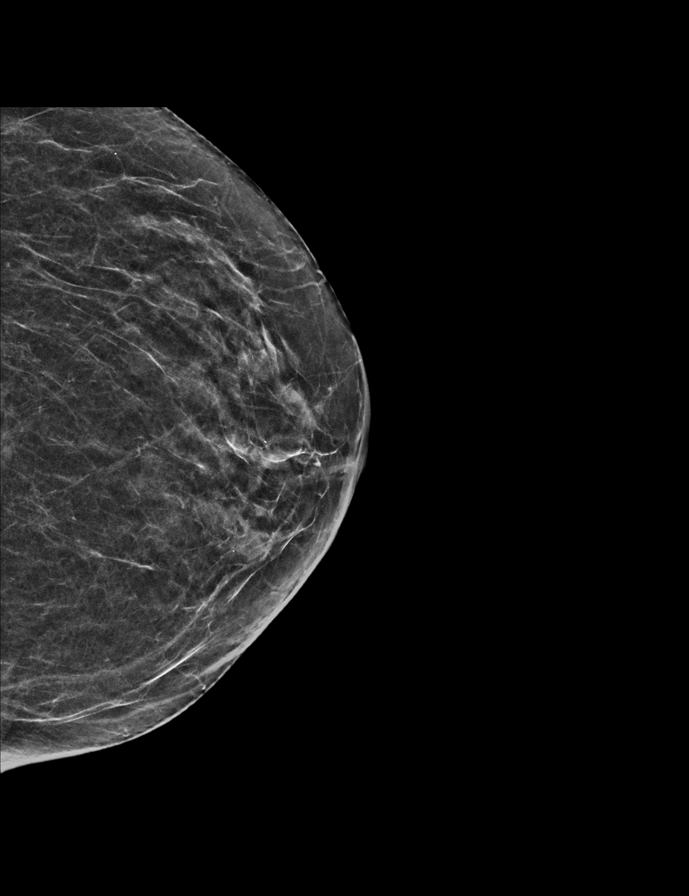

[R CC synth-2D]
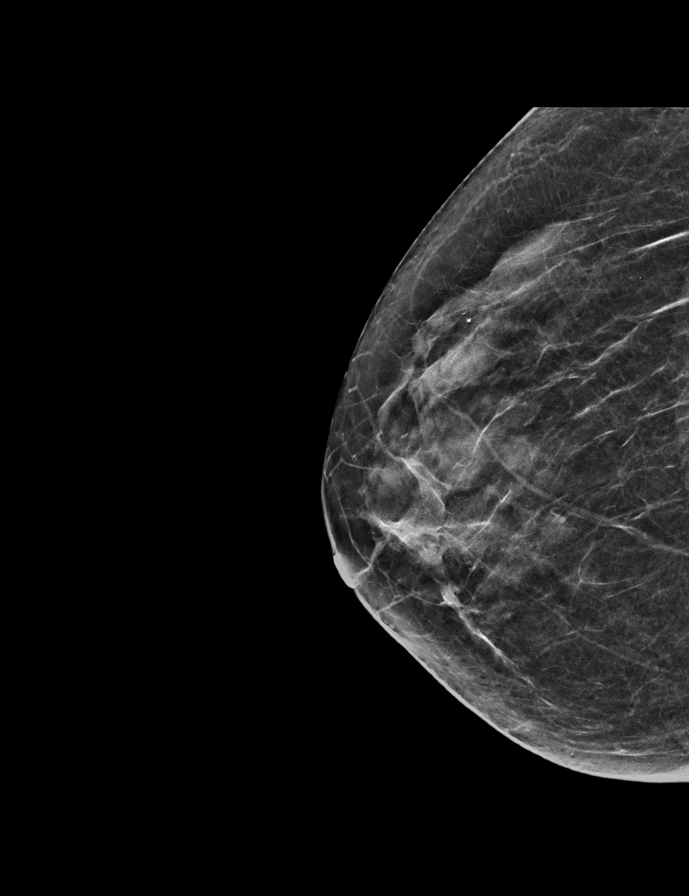

[L MLO synth-2D]
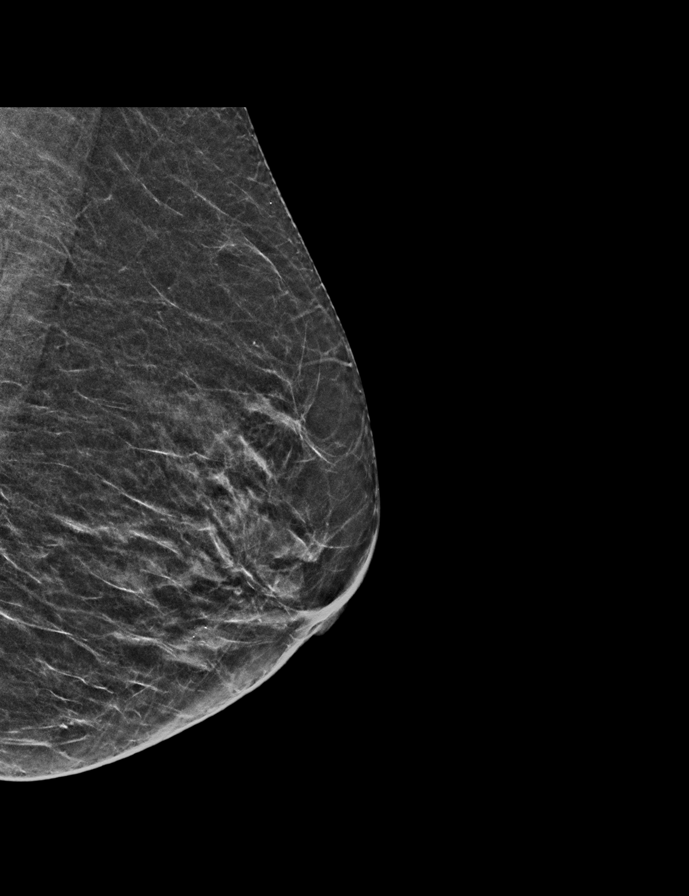

[L CC tomo · 2 of 46 frames shown]
[frame 15/46]
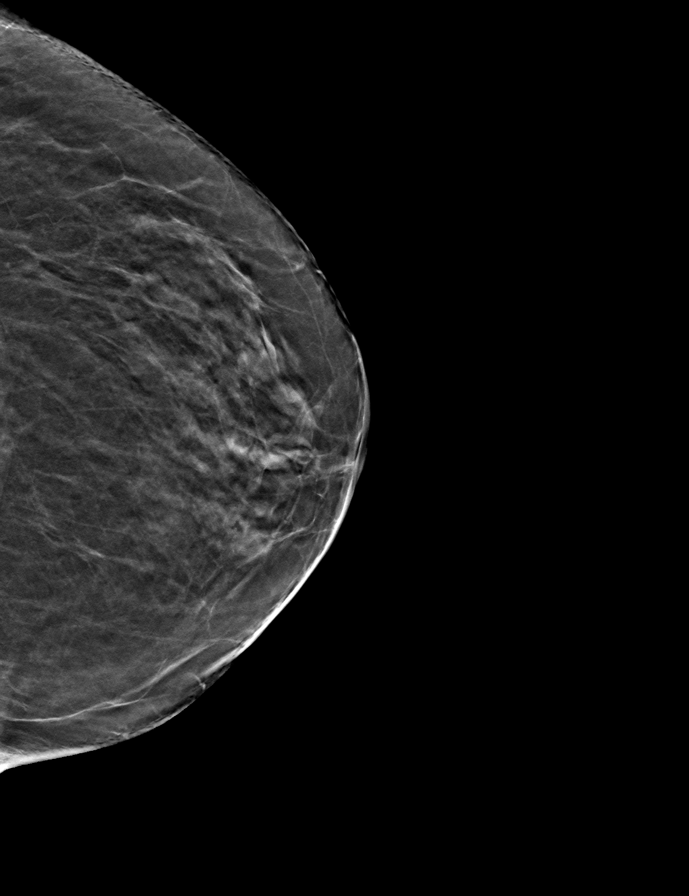
[frame 23/46]
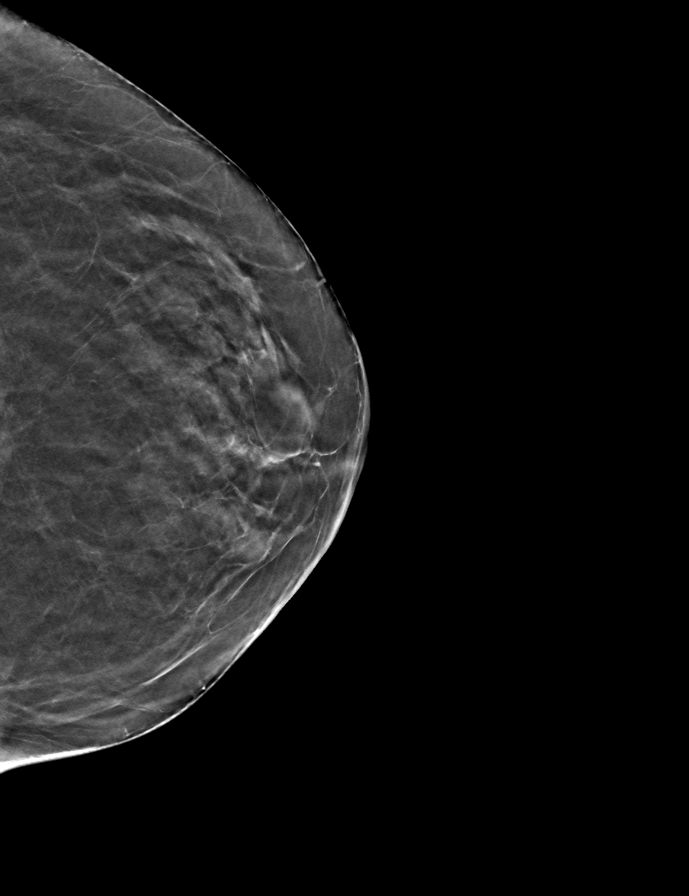

[R CC tomo · tomo slice 25/48.0]
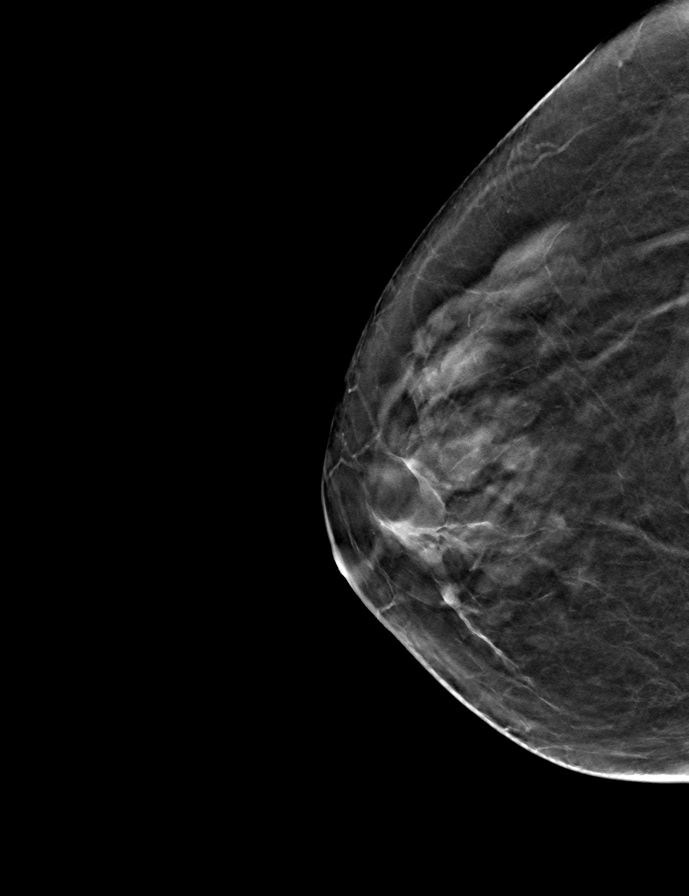

[L MLO tomo · tomo slice 22/43.0]
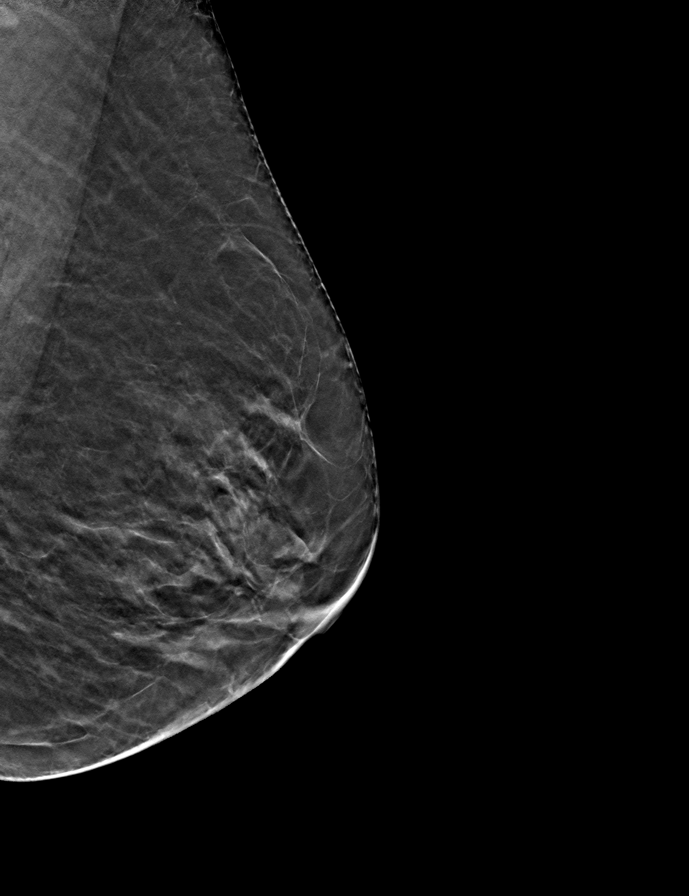

[R MLO tomo · tomo slice 23/45.0]
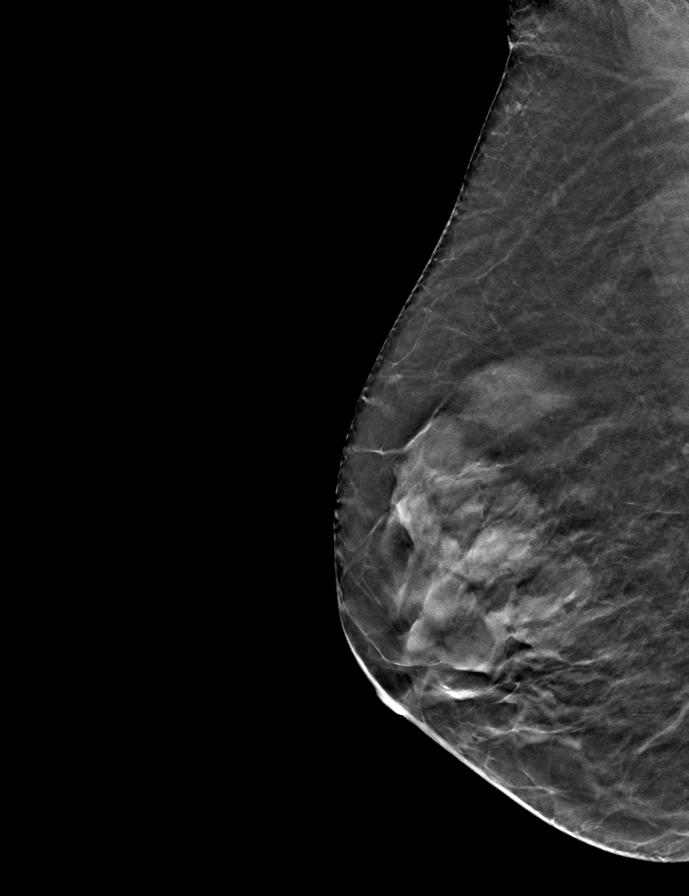

[9 of 24 positions shown; findings below may reference images not displayed]

ACR Breast Density Category b: There are scattered areas of
fibroglandular density.
FINDINGS: There are no findings suspicious for malignancy.
IMPRESSION: No mammographic evidence of malignancy. A result letter of this
screening mammogram will be mailed directly to the patient.

RECOMMENDATION:
Screening mammogram in one year. (Code:51-O-LD2)

BI-RADS CATEGORY  1: Negative.

## 2023-05-22 ENCOUNTER — Other Ambulatory Visit: Payer: Self-pay | Admitting: Family Medicine

## 2023-05-23 NOTE — Telephone Encounter (Signed)
 Copied from CRM [id number]. Topic: Clinical - Prescription Issue >> May 22, 2023  3:49 PM Shelah Lewandowsky wrote: Reason for CRM: Dulaglutide (TRULICITY) 1.5 MG/0.5ML SOAJ- asking why there is only one refill instead of three, would like message on mychart

## 2023-06-25 ENCOUNTER — Ambulatory Visit: Payer: Medicare Other | Admitting: Family Medicine

## 2023-07-21 ENCOUNTER — Other Ambulatory Visit: Payer: Self-pay | Admitting: Family Medicine

## 2023-07-26 ENCOUNTER — Other Ambulatory Visit: Payer: Self-pay | Admitting: Family Medicine

## 2023-07-26 DIAGNOSIS — E1169 Type 2 diabetes mellitus with other specified complication: Secondary | ICD-10-CM

## 2023-07-29 ENCOUNTER — Encounter (HOSPITAL_COMMUNITY): Payer: Self-pay

## 2023-07-31 ENCOUNTER — Telehealth: Payer: Self-pay | Admitting: Family Medicine

## 2023-07-31 NOTE — Telephone Encounter (Signed)
 Refills were sent on 5/9

## 2023-07-31 NOTE — Telephone Encounter (Signed)
 Patient came by office needs a refill requesting a 3 month supply  Prescription Request  07/31/2023  LOV: 02/21/2023  What is the name of the medication or equipment? TRULICITY  1.5 MG/0.5ML [facility] [place] [[id number]]   Have you contacted your pharmacy to request a refill? Yes   Which pharmacy would you like this sent to?   [facility] - [place], [place] - [address] [place] [address] Phone: [phone] Fax: [phone]   Patient notified that their request is being sent to the clinical staff for review and that they should receive a response within 2 business days.   Please advise at walked into the office

## 2023-08-05 ENCOUNTER — Encounter: Payer: Self-pay | Admitting: Family Medicine

## 2023-08-05 ENCOUNTER — Ambulatory Visit (INDEPENDENT_AMBULATORY_CARE_PROVIDER_SITE_OTHER): Payer: Medicare Other | Admitting: Family Medicine

## 2023-08-05 VITALS — BP 134/74 | HR 85 | Resp 16 | Ht 64.0 in | Wt 117.0 lb

## 2023-08-05 DIAGNOSIS — R7301 Impaired fasting glucose: Secondary | ICD-10-CM

## 2023-08-05 DIAGNOSIS — E782 Mixed hyperlipidemia: Secondary | ICD-10-CM

## 2023-08-05 DIAGNOSIS — E038 Other specified hypothyroidism: Secondary | ICD-10-CM

## 2023-08-05 DIAGNOSIS — I10 Essential (primary) hypertension: Secondary | ICD-10-CM

## 2023-08-05 DIAGNOSIS — Z794 Long term (current) use of insulin: Secondary | ICD-10-CM

## 2023-08-05 DIAGNOSIS — E039 Hypothyroidism, unspecified: Secondary | ICD-10-CM | POA: Diagnosis not present

## 2023-08-05 DIAGNOSIS — E1169 Type 2 diabetes mellitus with other specified complication: Secondary | ICD-10-CM | POA: Diagnosis not present

## 2023-08-05 DIAGNOSIS — E559 Vitamin D deficiency, unspecified: Secondary | ICD-10-CM

## 2023-08-05 MED ORDER — LANTUS SOLOSTAR 100 UNIT/ML ~~LOC~~ SOPN: 22.0000 [IU] | PEN_INJECTOR | Freq: Every day | SUBCUTANEOUS | 2 refills | Status: AC

## 2023-08-05 MED ORDER — TELMISARTAN-HCTZ 40-12.5 MG PO TABS: 1.0000 | ORAL_TABLET | Freq: Every day | ORAL | 3 refills | Status: AC

## 2023-08-05 MED ORDER — ATORVASTATIN CALCIUM 40 MG PO TABS
40.0000 mg | ORAL_TABLET | Freq: Every evening | ORAL | 3 refills | Status: AC
Start: 2023-08-05 — End: ?

## 2023-08-05 MED ORDER — LEVOTHYROXINE SODIUM 75 MCG PO TABS: 75.0000 ug | ORAL_TABLET | Freq: Every day | ORAL | 3 refills | Status: DC

## 2023-08-05 MED ORDER — METOPROLOL SUCCINATE ER 50 MG PO TB24
50.0000 mg | ORAL_TABLET | Freq: Every day | ORAL | 3 refills | Status: AC
Start: 2023-08-05 — End: ?

## 2023-08-05 MED ORDER — AMLODIPINE BESYLATE 5 MG PO TABS: 5.0000 mg | ORAL_TABLET | Freq: Every day | ORAL | 3 refills | Status: AC

## 2023-08-05 NOTE — Assessment & Plan Note (Signed)
 Controlled Encouraged to continue taking amlodipine  5 mg, metoprolol  50 mg,  and MICARDIS  HCT 40-12.5 mg daily Low-sodium diet with increased physical activity encouraged Asymptomatic today in the clinic A low-sodium diet of less than 2,300 mg daily is recommended, along with moderate-intensity physical activity for at least 150 minutes per week. The patient is encouraged to maintain these lifestyle modifications to help manage her blood pressure effectively.  Long-term considerations were discussed, emphasizing that uncontrolled hypertension increases the risk of cardiovascular diseases, including stroke, coronary artery disease, and heart failure.  The patient is encouraged to seek emergency care if blood pressure exceeds 180/120 and is accompanied by symptoms such as headaches, chest pain, palpitations, blurred vision, or dizziness. She verbalized understanding and will follow up as scheduled.  BP Readings from Last 3 Encounters:  08/05/23 134/74  02/21/23 132/72  10/22/22 135/76

## 2023-08-05 NOTE — Assessment & Plan Note (Signed)
 She takes atorvastatin  40 mg daily No muscle aches or pain reported Encouraged to increase your intake of foods rich in fruits, vegetables, whole grains Lean proteins: chicken, fish, beans, legumes Low Fat dairy products Reduced intake of saturated fats, trans fatty acids, cholesterol Aim to be active at least 5 days a week for 30 minutes each day ( walking briskly)  Lab Results  Component Value Date   CHOL 137 02/21/2023   HDL 71 02/21/2023   LDLCALC 51 02/21/2023   TRIG 78 02/21/2023   CHOLHDL 1.9 02/21/2023

## 2023-08-05 NOTE — Assessment & Plan Note (Signed)
 Denies polyuria, polyphagia, and polydipsia She takes metformin  1000 mg twice daily,Trulicity  1.5 subcu weekly injection and insulin  glargine 22 units at bedtime  I recommended reducing his intake of high-sugar foods and beverages and increasing physical activity to 150 minutes of moderate intensity per week, as tolerated  Encouraged to decrease her intake of high sugar foods and beverages with increased physical activity Pending hga1c Lab Results  Component Value Date   HGBA1C 6.8 (H) 02/21/2023

## 2023-08-05 NOTE — Patient Instructions (Signed)
 I appreciate the opportunity to provide care to you today!    Follow up:  4 months  Labs: please stop by the lab today to get your blood drawn (CBC, CMP, TSH, Lipid profile, HgA1c, Vit D)  Please schedule medicare annual wellness visit  For a Healthier YOU, I Recommend: Reducing your intake of sugar, sodium, carbohydrates, and saturated fats. Increasing your fiber intake by incorporating more whole grains, fruits, and vegetables into your meals. Setting healthy goals with a focus on lowering your consumption of carbs, sugar, and unhealthy fats. Adding variety to your diet by including a wide range of fruits and vegetables. Cutting back on soda and limiting processed foods as much as possible. Staying active: In addition to taking your weight loss medication, aim for at least 150 minutes of moderate-intensity physical activity each week for optimal results.      Please continue to a heart-healthy diet and increase your physical activities. Try to exercise for at least five days a week.    It was a pleasure to see you and I look forward to continuing to work together on your health and well-being. Please do not hesitate to call the office if you need care or have questions about your care.  In case of emergency, please visit the Emergency Department for urgent care, or contact our clinic at [phone] to schedule an appointment. We're here to help you!   Have a wonderful day and week. With Gratitude, Kenna Seward MSN, FNP-BC

## 2023-08-05 NOTE — Progress Notes (Addendum)
 Established Patient Office Visit  Subjective:  Patient ID: Lisa Beck, female    DOB: [birth year]  Age: 83 y.o. MRN: [id number]  CC:  Chief Complaint  Patient presents with   Hypertension    Follow up visit     HPI Lisa Beck is a 83 y.o. female with past medical history of Hypertension, type 2 diabetes, hypothyroidism and hyperlipidemia presents for f/u of  chronic medical conditions. For the details of today's visit, please refer to the assessment and plan.     Past Medical History:  Diagnosis Date   Arthritis    Diabetes mellitus without complication (HCC)    History of kidney stones    HOH (hard of hearing)    Hyperlipidemia    Hypertension    Hypothyroidism     Past Surgical History:  Procedure Laterality Date   COLONOSCOPY  2007   Dr. Homero Luster: diverticulosis   COLONOSCOPY WITH PROPOFOL  N/A 04/24/2019   Dr. Riley Cheadle: Diverticulosis in the sigmoid colon, two 5 to 9 mm polyps removed from the descending and ascending colon.  Pathology revealed both to be benign polyps with no adenomatous changes. No future surveillance colonoscopies due to age.   ESOPHAGOGASTRODUODENOSCOPY (EGD) WITH PROPOFOL  N/A 04/24/2019   Dr. Riley Cheadle: Small hiatal hernia, one gastric polyp benign.   POLYPECTOMY  04/24/2019   Procedure: POLYPECTOMY;  Surgeon: Suzette Espy, MD;  Location: [facility];  Service: Endoscopy;;  ascending;   REVERSE SHOULDER ARTHROPLASTY Right 02/16/2021   Procedure: REVERSE SHOULDER ARTHROPLASTY;  Surgeon: Tonita Frater, MD;  Location: [facility];  Service: Orthopedics;  Laterality: Right;    Family History  Problem Relation Age of Onset   Breast cancer Mother    CAD Father        MI sometime in 41, age unknown   CAD Brother        Pt has 3 brothers that died of MIs between ages 59s-70s   CAD Brother    CAD Brother    Colon cancer Neg Hx    Ulcers Neg Hx     Social History   Socioeconomic History   Marital status: Married    Spouse name: Not on file    Number of children: Not on file   Years of education: Not on file   Highest education level: Not on file  Occupational History   Not on file  Tobacco Use   Smoking status: Never   Smokeless tobacco: Never  Vaping Use   Vaping status: Never Used  Substance and Sexual Activity   Alcohol use: No   Drug use: No   Sexual activity: Not Currently  Other Topics Concern   Not on file  Social History Narrative   Not on file   Social Drivers of Health   Financial Resource Strain: Low Risk  (01/24/2021)   Overall Financial Resource Strain (CARDIA)    Difficulty of Paying Living Expenses: Not hard at all  Food Insecurity: No Food Insecurity (01/24/2021)   Hunger Vital Sign    Worried About Running Out of Food in the Last Year: Never true    Ran Out of Food in the Last Year: Never true  Transportation Needs: No Transportation Needs (01/24/2021)   PRAPARE - Administrator, Civil Service (Medical): No    Lack of Transportation (Non-Medical): No  Physical Activity: Sufficiently Active (01/24/2021)   Exercise Vital Sign    Days of Exercise per Week: 5 days    Minutes  of Exercise per Session: 60 min  Stress: No Stress Concern Present (01/24/2021)   [employer] of Occupational Health - Occupational Stress Questionnaire    Feeling of Stress : Not at all  Social Connections: Moderately Isolated (01/24/2021)   Social Connection and Isolation Panel [NHANES]    Frequency of Communication with Friends and Family: Twice a week    Frequency of Social Gatherings with Friends and Family: Twice a week    Attends Religious Services: Never    Database administrator or Organizations: No    Attends Banker Meetings: Never    Marital Status: Married  Catering manager Violence: Not At Risk (01/24/2021)   Humiliation, Afraid, Rape, and Kick questionnaire    Fear of Current or Ex-Partner: No    Emotionally Abused: No    Physically Abused: No    Sexually Abused: No     Outpatient Medications Prior to Visit  Medication Sig Dispense Refill   aspirin  EC 81 MG tablet Take 81 mg by mouth daily.      carbamide peroxide (DEBROX) 6.5 % OTIC solution Place 5 drops into both ears 2 (two) times daily. 15 mL 0   glucose blood (FREESTYLE LITE) test strip USE TO TEST BLOOD SUGAR DAILY 100 strip 3   metFORMIN  (GLUCOPHAGE ) 1000 MG tablet Take 1 tablet (1,000 mg total) by mouth 2 (two) times daily with a meal. 180 tablet 3   ondansetron  (ZOFRAN -ODT) 8 MG disintegrating tablet 8mg  ODT q4 hours prn nausea 10 tablet 0   TRULICITY  1.5 MG/0.5ML SOAJ INJECT 1.5 MG UNDER THE SKIN ONCE A WEEK 2 mL 12   VITAMIN D  PO Take by mouth daily.     amLODipine  (NORVASC ) 5 MG tablet TAKE 1 TABLET DAILY 90 tablet 3   atorvastatin  (LIPITOR) 40 MG tablet TAKE 1 TABLET EVERY EVENING 90 tablet 3   LANTUS  SOLOSTAR 100 UNIT/ML Solostar Pen INJECT 22 UNITS UNDER THE SKIN AT BEDTIME 15 mL 4   levothyroxine  (SYNTHROID ) 75 MCG tablet TAKE 1 TABLET DAILY 90 tablet 3   metoprolol  succinate (TOPROL -XL) 50 MG 24 hr tablet TAKE 1 TABLET DAILY, TAKE WITH OR IMMEDIATELY FOLLOWING A MEAL 90 tablet 3   MICARDIS  HCT 40-12.5 MG tablet TAKE 1 TABLET DAILY 90 tablet 3   nitrofurantoin , macrocrystal-monohydrate, (MACROBID ) 100 MG capsule Take 1 capsule (100 mg total) by mouth 2 (two) times daily. 14 capsule 0   No facility-administered medications prior to visit.    No Known Allergies  ROS Review of Systems  Constitutional:  Negative for chills and fever.  Eyes:  Negative for visual disturbance.  Respiratory:  Negative for chest tightness and shortness of breath.   Neurological:  Negative for dizziness and headaches.      Objective:     Physical Exam HENT:     Head: Normocephalic.     Mouth/Throat:     Mouth: Mucous membranes are moist.  Cardiovascular:     Rate and Rhythm: Normal rate.     Heart sounds: Normal heart sounds.  Pulmonary:     Effort: Pulmonary effort is normal.     Breath  sounds: Normal breath sounds.  Neurological:     Mental Status: She is alert.     BP 134/74   Pulse 85   Resp 16   Ht 5\' 4"  (1.626 m)   Wt 117 lb (53.1 kg)   SpO2 96%   BMI 20.08 kg/m  Wt Readings from Last 3 Encounters:  08/05/23 117 lb (  53.1 kg)  02/21/23 120 lb 1.3 oz (54.5 kg)  10/22/22 123 lb 1.3 oz (55.8 kg)    Lab Results  Component Value Date   TSH 2.220 02/21/2023   Lab Results  Component Value Date   WBC 7.9 02/21/2023   HGB 12.3 02/21/2023   HCT 37.7 02/21/2023   MCV 86 02/21/2023   PLT 311 02/21/2023   Lab Results  Component Value Date   NA 138 02/21/2023   K 4.3 02/21/2023   CO2 23 02/21/2023   GLUCOSE 87 02/21/2023   BUN 14 02/21/2023   CREATININE 0.70 02/21/2023   BILITOT 0.6 02/21/2023   ALKPHOS 91 02/21/2023   AST 18 02/21/2023   ALT 15 02/21/2023   PROT 6.9 02/21/2023   ALBUMIN 4.4 02/21/2023   CALCIUM  10.7 (H) 02/21/2023   ANIONGAP 8 09/22/2021   EGFR 86 02/21/2023   Lab Results  Component Value Date   CHOL 137 02/21/2023   Lab Results  Component Value Date   HDL 71 02/21/2023   Lab Results  Component Value Date   LDLCALC 51 02/21/2023   Lab Results  Component Value Date   TRIG 78 02/21/2023   Lab Results  Component Value Date   CHOLHDL 1.9 02/21/2023   Lab Results  Component Value Date   HGBA1C 6.8 (H) 02/21/2023      Assessment & Plan:  Primary hypertension Assessment & Plan: Controlled Encouraged to continue taking amlodipine  5 mg, metoprolol  50 mg,  and MICARDIS  HCT 40-12.5 mg daily Low-sodium diet with increased physical activity encouraged Asymptomatic today in the clinic A low-sodium diet of less than 2,300 mg daily is recommended, along with moderate-intensity physical activity for at least 150 minutes per week. The patient is encouraged to maintain these lifestyle modifications to help manage her blood pressure effectively.  Long-term considerations were discussed, emphasizing that uncontrolled  hypertension increases the risk of cardiovascular diseases, including stroke, coronary artery disease, and heart failure.  The patient is encouraged to seek emergency care if blood pressure exceeds 180/120 and is accompanied by symptoms such as headaches, chest pain, palpitations, blurred vision, or dizziness. She verbalized understanding and will follow up as scheduled.  BP Readings from Last 3 Encounters:  08/05/23 134/74  02/21/23 132/72  10/22/22 135/76     Orders: -     amLODIPine  Besylate; Take 1 tablet (5 mg total) by mouth daily.  Dispense: 90 tablet; Refill: 3 -     Telmisartan -HCTZ; Take 1 tablet by mouth daily.  Dispense: 90 tablet; Refill: 3 -     Metoprolol  Succinate ER; Take 1 tablet (50 mg total) by mouth daily. Take with or immediately following a meal.  Dispense: 90 tablet; Refill: 3  Type 2 diabetes mellitus with other specified complication, with long-term current use of insulin  [facility]) Assessment & Plan: Denies polyuria, polyphagia, and polydipsia She takes metformin  1000 mg twice daily,Trulicity  1.5 subcu weekly injection and insulin  glargine 22 units at bedtime  I recommended reducing his intake of high-sugar foods and beverages and increasing physical activity to 150 minutes of moderate intensity per week, as tolerated  Encouraged to decrease her intake of high sugar foods and beverages with increased physical activity Pending hga1c Lab Results  Component Value Date   HGBA1C 6.8 (H) 02/21/2023       Orders: -     Lantus  SoloStar; Inject 22 Units into the skin at bedtime.  Dispense: 45 mL; Refill: 2  Mixed hyperlipidemia Assessment & Plan: She takes atorvastatin  40 mg  daily No muscle aches or pain reported Encouraged to increase your intake of foods rich in fruits, vegetables, whole grains Lean proteins: chicken, fish, beans, legumes Low Fat dairy products Reduced intake of saturated fats, trans fatty acids, cholesterol Aim to be active at least 5 days a  week for 30 minutes each day ( walking briskly)  Lab Results  Component Value Date   CHOL 137 02/21/2023   HDL 71 02/21/2023   LDLCALC 51 02/21/2023   TRIG 78 02/21/2023   CHOLHDL 1.9 02/21/2023     Orders: -     Atorvastatin  Calcium ; Take 1 tablet (40 mg total) by mouth every evening.  Dispense: 90 tablet; Refill: 3 -     Lipid panel -     CMP14+EGFR -     CBC with Differential/Platelet  Hypothyroidism, unspecified type -     Levothyroxine  Sodium; Take 1 tablet (75 mcg total) by mouth daily.  Dispense: 90 tablet; Refill: 3  IFG (impaired fasting glucose) -     Hemoglobin A1c  Vitamin D  deficiency -     VITAMIN D  25 Hydroxy (Vit-D Deficiency, Fractures)  TSH (thyroid-stimulating hormone deficiency) -     TSH + free T4  Note: This chart has been completed using Engineer, civil (consulting) software, and while attempts have been made to ensure accuracy, certain words and phrases may not be transcribed as intended.    Follow-up: Return in about 4 months (around 12/06/2023).   Najee Manninen, FNP

## 2023-08-06 LAB — CMP14+EGFR
ALT: 22 IU/L (ref 0–32)
AST: 26 IU/L (ref 0–40)
Albumin: 4.5 g/dL (ref 3.7–4.7)
Alkaline Phosphatase: 111 IU/L (ref 44–121)
BUN/Creatinine Ratio: 18 (ref 12–28)
BUN: 12 mg/dL (ref 8–27)
Bilirubin Total: 0.8 mg/dL (ref 0.0–1.2)
CO2: 20 mmol/L (ref 20–29)
Calcium: 11 mg/dL — ABNORMAL HIGH (ref 8.7–10.3)
Chloride: 97 mmol/L (ref 96–106)
Creatinine, Ser: 0.68 mg/dL (ref 0.57–1.00)
Globulin, Total: 2.4 g/dL (ref 1.5–4.5)
Glucose: 108 mg/dL — ABNORMAL HIGH (ref 70–99)
Potassium: 4.7 mmol/L (ref 3.5–5.2)
Sodium: 135 mmol/L (ref 134–144)
Total Protein: 6.9 g/dL (ref 6.0–8.5)
eGFR: 86 mL/min/{1.73_m2} (ref >59)

## 2023-08-06 LAB — CBC WITH DIFFERENTIAL/PLATELET
Basophils Absolute: 0.1 10*3/uL (ref 0.0–0.2)
Basos: 1 %
EOS (ABSOLUTE): 0.2 10*3/uL (ref 0.0–0.4)
Eos: 2 %
Hematocrit: 38.6 % (ref 34.0–46.6)
Hemoglobin: 12.8 g/dL (ref 11.1–15.9)
Immature Grans (Abs): 0 10*3/uL (ref 0.0–0.1)
Immature Granulocytes: 0 %
Lymphocytes Absolute: 2.2 10*3/uL (ref 0.7–3.1)
Lymphs: 26 %
MCH: 28.7 pg (ref 26.6–33.0)
MCHC: 33.2 g/dL (ref 31.5–35.7)
MCV: 87 fL (ref 79–97)
Monocytes Absolute: 0.7 10*3/uL (ref 0.1–0.9)
Monocytes: 8 %
Neutrophils Absolute: 5.4 10*3/uL (ref 1.4–7.0)
Neutrophils: 63 %
Platelets: 329 10*3/uL (ref 150–450)
RBC: 4.46 x10E6/uL (ref 3.77–5.28)
RDW: 13.4 % (ref 11.7–15.4)
WBC: 8.5 10*3/uL (ref 3.4–10.8)

## 2023-08-06 LAB — LIPID PANEL
Chol/HDL Ratio: 1.9 ratio (ref 0.0–4.4)
Cholesterol, Total: 143 mg/dL (ref 100–199)
HDL: 77 mg/dL (ref >39)
LDL Chol Calc (NIH): 52 mg/dL (ref 0–99)
Triglycerides: 74 mg/dL (ref 0–149)
VLDL Cholesterol Cal: 14 mg/dL (ref 5–40)

## 2023-08-06 LAB — TSH+FREE T4
Free T4: 2.01 ng/dL — ABNORMAL HIGH (ref 0.82–1.77)
TSH: 2.3 u[IU]/mL (ref 0.450–4.500)

## 2023-08-06 LAB — HEMOGLOBIN A1C
Est. average glucose Bld gHb Est-mCnc: 134 mg/dL
Hgb A1c MFr Bld: 6.3 % — ABNORMAL HIGH (ref 4.8–5.6)

## 2023-08-06 LAB — VITAMIN D 25 HYDROXY (VIT D DEFICIENCY, FRACTURES): Vit D, 25-Hydroxy: 49.3 ng/mL (ref 30.0–100.0)

## 2023-08-09 ENCOUNTER — Ambulatory Visit: Payer: Self-pay | Admitting: Family Medicine

## 2023-08-09 DIAGNOSIS — E039 Hypothyroidism, unspecified: Secondary | ICD-10-CM

## 2023-08-09 MED ORDER — LEVOTHYROXINE SODIUM 50 MCG PO TABS: 50.0000 ug | ORAL_TABLET | Freq: Every day | ORAL | 3 refills | Status: DC

## 2023-08-22 ENCOUNTER — Telehealth: Payer: Self-pay | Admitting: Family Medicine

## 2023-08-22 ENCOUNTER — Other Ambulatory Visit: Payer: Self-pay

## 2023-08-22 DIAGNOSIS — E1169 Type 2 diabetes mellitus with other specified complication: Secondary | ICD-10-CM

## 2023-08-22 MED ORDER — TRULICITY 1.5 MG/0.5ML ~~LOC~~ SOAJ: SUBCUTANEOUS | 3 refills | Status: AC

## 2023-08-22 NOTE — Telephone Encounter (Signed)
 Patient husband here says Trulicity  refills are not correct and needs to be fixed. This will be costing too much for them. Husband is requesting a call back asap says this will need to be elevated if not fixed soon. Husband's number is [phone]. Thanks

## 2023-08-23 ENCOUNTER — Other Ambulatory Visit: Payer: Self-pay

## 2023-08-23 DIAGNOSIS — Z794 Long term (current) use of insulin: Secondary | ICD-10-CM

## 2023-08-23 NOTE — Telephone Encounter (Signed)
 Trulicity  called in for 90 day supply as requested

## 2023-08-29 ENCOUNTER — Other Ambulatory Visit: Payer: Self-pay | Admitting: Family Medicine

## 2023-09-18 LAB — TSH+FREE T4
Free T4: 1.37 ng/dL (ref 0.82–1.77)
TSH: 13 u[IU]/mL — ABNORMAL HIGH (ref 0.450–4.500)

## 2023-09-20 ENCOUNTER — Other Ambulatory Visit: Payer: Self-pay | Admitting: Family Medicine

## 2023-09-20 DIAGNOSIS — E038 Other specified hypothyroidism: Secondary | ICD-10-CM

## 2023-09-20 MED ORDER — LEVOTHYROXINE SODIUM 25 MCG PO TABS: 25.0000 ug | ORAL_TABLET | Freq: Every day | ORAL | 3 refills | Status: DC

## 2023-09-23 NOTE — Progress Notes (Signed)
My chart message read by patient.

## 2023-10-01 ENCOUNTER — Encounter: Payer: Self-pay | Admitting: Emergency Medicine

## 2023-10-01 ENCOUNTER — Ambulatory Visit: Admission: EM | Admit: 2023-10-01 | Discharge: 2023-10-01 | Disposition: A | Discharge status: Home / Self Care

## 2023-10-01 DIAGNOSIS — H6123 Impacted cerumen, bilateral: Secondary | ICD-10-CM

## 2023-10-01 NOTE — Discharge Instructions (Signed)
We flushed out the earwax from your ear(s) today.  Do not use Q-tips as this makes symptoms worse and pushes wax further into the ear canal.  You may use over-the-counter Debrox eardrops as needed for earwax removal at home as needed.  If you develop any new or worsening symptoms or if your symptoms do not start to improve, pleases return here or follow-up with your primary care provider. If your symptoms are severe, please go to the emergency room.

## 2023-10-01 NOTE — ED Provider Notes (Signed)
 [facility]    CSN: [id number] Arrival date & time: 10/01/23  9187      History   Chief Complaint No chief complaint on file.   HPI Lisa Beck is a 83 y.o. female.   Lisa Beck is a 83 y.o. female presenting for chief complaint of hearing loss of both ears. She believes she may have impacted cerumen as this has caused bilateral hearing loss in the past. Denies recent trauma/injuries to the ears, ear pain, drainage from the ears, fever/chills, viral URI symptoms, dizziness, and tinnitus. She tried using OTC ear wax removal at home without relief.      Past Medical History:  Diagnosis Date   Arthritis    Diabetes mellitus without complication (HCC)    History of kidney stones    HOH (hard of hearing)    Hyperlipidemia    Hypertension    Hypothyroidism     Patient Active Problem List   Diagnosis Date Noted   Pseudophakia of both eyes 09/04/2021   Acute cystitis with hematuria 08/01/2021   Cystitis 07/04/2021   Arthritis of right glenohumeral joint 02/16/2021   Impacted cerumen of both ears 12/01/2020   HTN (hypertension) 10/19/2020   HLD (hyperlipidemia) 10/19/2020   Hypothyroidism 10/19/2020   Primary osteoarthritis of right shoulder 10/19/2020   Macular pucker, left eye 08/27/2019   Macular retinoschisis, 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 Golda: diverticulosis   COLONOSCOPY WITH PROPOFOL  N/A 04/24/2019   Dr. Shaaron: Diverticulosis in the sigmoid colon, two 5 to 9 mm polyps removed from the descending and ascending colon.  Pathology revealed both to be benign polyps with no adenomatous changes. No future surveillance colonoscopies due to age.   ESOPHAGOGASTRODUODENOSCOPY (EGD) WITH PROPOFOL  N/A 04/24/2019   Dr.  Shaaron: Small hiatal hernia, one gastric polyp benign.   POLYPECTOMY  04/24/2019   Procedure: POLYPECTOMY;  Surgeon: Shaaron Lamar HERO, MD;  [facility]: [facility];  Service: Endoscopy;;  ascending;   REVERSE SHOULDER ARTHROPLASTY Right 02/16/2021   Procedure: REVERSE SHOULDER ARTHROPLASTY;  Surgeon: Onesimo Oneil LABOR, MD;  Location: [facility];  Service: Orthopedics;  Laterality: Right;    OB History   No obstetric history on file.      Home Medications    Prior to Admission medications   Medication Sig Start Date End Date Taking? Authorizing Provider  amLODipine  (NORVASC ) 5 MG tablet Take 1 tablet (5 mg total) by mouth daily. 08/05/23   Zarwolo, Gloria, FNP  aspirin  EC 81 MG tablet Take 81 mg by mouth daily.     [provider]  atorvastatin  (LIPITOR) 40 MG tablet Take 1 tablet (40 mg total) by mouth every evening. 08/05/23   Zarwolo, Gloria, FNP  carbamide peroxide (DEBROX) 6.5 % OTIC solution Place 5 drops into both ears 2 (two) times daily. 12/01/20   Tobie Suzzane MARLA, MD  Dulaglutide  (TRULICITY ) 1.5 MG/0.5ML SOAJ INJECT 1.5 MG UNDER THE SKIN ONCE A WEEK 08/22/23   Zarwolo, Gloria, FNP  FREESTYLE LITE test strip USE TO TEST BLOOD SUGAR DAILY 08/29/23   Zarwolo, Gloria, FNP  insulin  glargine (LANTUS  SOLOSTAR) 100 UNIT/ML Solostar Pen Inject 22 Units into the skin at bedtime. 08/05/23   Zarwolo, Gloria, FNP  levothyroxine  (SYNTHROID ) 25 MCG tablet Take 1  tablet (25 mcg total) by mouth daily. 09/20/23   Zarwolo, Gloria, FNP  metFORMIN  (GLUCOPHAGE ) 1000 MG tablet Take 1 tablet (1,000 mg total) by mouth 2 (two) times daily with a meal. 10/24/22   Zarwolo, Gloria, FNP  metoprolol  succinate (TOPROL -XL) 50 MG 24 hr tablet Take 1 tablet (50 mg total) by mouth daily. Take with or immediately following a meal. 08/05/23   Zarwolo, Gloria, FNP  ondansetron  (ZOFRAN -ODT) 8 MG disintegrating tablet 8mg  ODT q4 hours prn nausea 09/22/21   Geroldine Berg, MD  telmisartan -hydrochlorothiazide  (MICARDIS  HCT) 40-12.5 MG  tablet Take 1 tablet by mouth daily. 08/05/23   Zarwolo, Gloria, FNP  VITAMIN D  PO Take by mouth daily.    [provider]    Family History Family History  Problem Relation Age of Onset   Breast cancer Mother    CAD Father        MI sometime in 71, age unknown   CAD Brother        Pt has 3 brothers that died of MIs between ages 53s-70s   CAD Brother    CAD Brother    Colon cancer Neg Hx    Ulcers Neg Hx     Social History Social History   Tobacco Use   Smoking status: Never   Smokeless tobacco: Never  Vaping Use   Vaping status: Never Used  Substance Use Topics   Alcohol use: No   Drug use: No     Allergies   Patient has no known allergies.   Review of Systems Review of Systems Per HPI  Physical Exam Triage Vital Signs ED Triage Vitals  Encounter Vitals Group     BP 10/01/23 0829 139/72     Girls Systolic BP Percentile --      Girls Diastolic BP Percentile --      Boys Systolic BP Percentile --      Boys Diastolic BP Percentile --      Pulse Rate 10/01/23 0829 78     Resp 10/01/23 0829 18     Temp 10/01/23 0829 (!) 97.4 F (36.3 C)     Temp Source 10/01/23 0829 Oral     SpO2 10/01/23 0829 97 %     Weight --      Height --      Head Circumference --      Peak Flow --      Pain Score 10/01/23 0830 0     Pain Loc --      Pain Education --      Exclude from Growth Chart --    No data found.  Updated Vital Signs BP 139/72 (BP Location: Right Arm)   Pulse 78   Temp (!) 97.4 F (36.3 C) (Oral)   Resp 18   SpO2 97%   Visual Acuity Right Eye Distance:   Left Eye Distance:   Bilateral Distance:    Right Eye Near:   Left Eye Near:    Bilateral Near:     Physical Exam Vitals and nursing note reviewed.  Constitutional:      Appearance: She is not ill-appearing or toxic-appearing.  HENT:     Head: Normocephalic and atraumatic.     Right Ear: Hearing, tympanic membrane, ear canal and external ear normal. There is impacted  cerumen.     Left Ear: Hearing, tympanic membrane, ear canal and external ear normal. There is impacted cerumen.     Nose: Nose normal.     Mouth/Throat:  Lips: Pink.  Eyes:     General: Lids are normal. Vision grossly intact. Gaze aligned appropriately.     Extraocular Movements: Extraocular movements intact.     Conjunctiva/sclera: Conjunctivae normal.  Cardiovascular:     Rate and Rhythm: Normal rate and regular rhythm.     Heart sounds: Normal heart sounds, S1 normal and S2 normal.  Pulmonary:     Effort: Pulmonary effort is normal. No respiratory distress.     Breath sounds: Normal breath sounds and air entry.  Musculoskeletal:     Cervical back: Neck supple.  Skin:    General: Skin is warm and dry.     Capillary Refill: Capillary refill takes less than 2 seconds.     Findings: No rash.  Neurological:     General: No focal deficit present.     Mental Status: She is alert and oriented to person, place, and time. Mental status is at baseline.     Cranial Nerves: No dysarthria or facial asymmetry.  Psychiatric:        Mood and Affect: Mood normal.        Speech: Speech normal.        Behavior: Behavior normal.        Thought Content: Thought content normal.        Judgment: Judgment normal.      UC Treatments / Results  Labs (all labs ordered are listed, but only abnormal results are displayed) Labs Reviewed - No data to display  EKG   Radiology No results found.  Procedures Procedures (including critical care time)  Medications Ordered in UC Medications - No data to display  Initial Impression / Assessment and Plan / UC Course  I have reviewed the triage vital signs and the nursing notes.  Pertinent labs & imaging results that were available during my care of the patient were reviewed by me and considered in my medical decision making (see chart for details).   1. Bilateral impacted cerumen Both ear(s) cleaned with ear lavage to remove ear wax  impactions bilaterally by nursing staff. I was additionally able to remove ear wax during ear lavage with a curette, patient tolerated this well. Copious ear wax removed with curette and ear lavage.  Reassessment shows normal bilateral tympanic membranes without signs of AOM/AOE.  Patient may use debrox ear drops at home as needed for wax removal and has been advised to avoid using Q-tips.   Counseled patient on potential for adverse effects with medications prescribed/recommended today, strict ER and return-to-clinic precautions discussed, patient verbalized understanding.    Final Clinical Impressions(s) / UC Diagnoses   Final diagnoses:  Bilateral impacted cerumen     Discharge Instructions      We flushed out the earwax from your ear(s) today.  Do not use Q-tips as this makes symptoms worse and pushes wax further into the ear canal.  You may use over-the-counter Debrox eardrops as needed for earwax removal at home as needed.  If you develop any new or worsening symptoms or if your symptoms do not start to improve, pleases return here or follow-up with your primary care provider. If your symptoms are severe, please go to the emergency room.    ED Prescriptions   None    PDMP not reviewed this encounter.   Enedelia Dorna HERO, [place] 10/01/23 [id number]

## 2023-10-01 NOTE — ED Triage Notes (Signed)
 Bilateral ears are stopped up x a couple of weeks.

## 2023-10-09 ENCOUNTER — Other Ambulatory Visit: Payer: Self-pay

## 2023-10-09 ENCOUNTER — Encounter: Payer: Self-pay | Admitting: Emergency Medicine

## 2023-10-09 ENCOUNTER — Ambulatory Visit
Admission: EM | Admit: 2023-10-09 | Discharge: 2023-10-09 | Disposition: A | Discharge status: Home / Self Care | Attending: Nurse Practitioner | Admitting: Nurse Practitioner

## 2023-10-09 DIAGNOSIS — R399 Unspecified symptoms and signs involving the genitourinary system: Secondary | ICD-10-CM | POA: Diagnosis not present

## 2023-10-09 LAB — POCT URINALYSIS DIP (MANUAL ENTRY)
Bilirubin, UA: NEGATIVE
Glucose, UA: NEGATIVE mg/dL
Nitrite, UA: NEGATIVE
Protein Ur, POC: NEGATIVE mg/dL
Spec Grav, UA: 1.02 (ref 1.010–1.025)
Urobilinogen, UA: 1 U/dL
pH, UA: 7 (ref 5.0–8.0)

## 2023-10-09 MED ORDER — CEPHALEXIN 500 MG PO CAPS: 500.0000 mg | ORAL_CAPSULE | Freq: Four times a day (QID) | ORAL | 0 refills | Status: DC

## 2023-10-09 NOTE — Discharge Instructions (Signed)
 A urine culture has been ordered.  You will be contacted when the results of the culture are received.  If the results of the culture are negative, you will be advised to stop the antibiotic. You will also have access to the results via MyChart. Take medication as prescribed. Make sure you are drinking at least 5-7 8 ounce glasses of water  daily. Develop a toileting schedule that will allow you to urinate at least every 2 hours. Avoid caffeine such as tea, soda, or coffee while symptoms persist. You may take over-the-counter Tylenol  as needed for pain or discomfort. As discussed, if your urine culture results are negative and you are continuing to experience symptoms, recommend follow-up with your primary care physician for further evaluation. Follow-up as needed.

## 2023-10-09 NOTE — ED Provider Notes (Signed)
 [facility]    CSN: [id number] Arrival date & time: 10/09/23  1139      History   Chief Complaint Chief Complaint  Patient presents with   Urinary Frequency    HPI Lisa Beck is a 83 y.o. female.   The history is provided by the patient.   Patient presents for complaints of urinary frequency, low back pain, and dysuria that started this morning.  She denies fever, chills, hematuria, urinary hesitancy, decreased urine stream, flank pain, or vaginal symptoms.  Patient denies history of recurrent UTIs.  Per review of chart, patient with history of kidney stone dating back to 2023.  She denies prior history of kidney infection.  So far, she has not tried any medication for her symptoms.  Past Medical History:  Diagnosis Date   Arthritis    Diabetes mellitus without complication (HCC)    History of kidney stones    HOH (hard of hearing)    Hyperlipidemia    Hypertension    Hypothyroidism     Patient Active Problem List   Diagnosis Date Noted   Pseudophakia of both eyes 09/04/2021   Acute cystitis with hematuria 08/01/2021   Cystitis 07/04/2021   Arthritis of right glenohumeral joint 02/16/2021   Impacted cerumen of both ears 12/01/2020   HTN (hypertension) 10/19/2020   HLD (hyperlipidemia) 10/19/2020   Hypothyroidism 10/19/2020   Primary osteoarthritis of right shoulder 10/19/2020   Macular pucker, left eye 08/27/2019   Macular retinoschisis, 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 Golda: diverticulosis   COLONOSCOPY WITH PROPOFOL  N/A 04/24/2019   Dr. Shaaron: Diverticulosis in the sigmoid colon, two 5 to 9 mm polyps removed from the descending and ascending colon.  Pathology revealed both to be benign polyps with no adenomatous  changes. No future surveillance colonoscopies due to age.   ESOPHAGOGASTRODUODENOSCOPY (EGD) WITH PROPOFOL  N/A 04/24/2019   Dr. Shaaron: Small hiatal hernia, one gastric polyp benign.   POLYPECTOMY  04/24/2019   Procedure: POLYPECTOMY;  Surgeon: Shaaron Lamar HERO, MD;  Location: [facility];  Service: Endoscopy;;  ascending;   REVERSE SHOULDER ARTHROPLASTY Right 02/16/2021   Procedure: REVERSE SHOULDER ARTHROPLASTY;  Surgeon: Onesimo Oneil LABOR, MD;  Location: [facility];  Service: Orthopedics;  Laterality: Right;    OB History   No obstetric history on file.      Home Medications    Prior to Admission medications   Medication Sig Start Date End Date Taking? Authorizing Provider  cephALEXin  (KEFLEX ) 500 MG capsule Take 1 capsule (500 mg total) by mouth 4 (four) times daily. 10/09/23  Yes Leath-Warren, Etta PARAS, NP  amLODipine  (NORVASC ) 5 MG tablet Take 1 tablet (5 mg total) by mouth daily. 08/05/23   Zarwolo, Gloria, FNP  aspirin  EC 81 MG tablet Take 81 mg by mouth daily.     [provider]  atorvastatin  (LIPITOR) 40 MG tablet Take 1 tablet (40 mg total) by mouth every evening. 08/05/23   Zarwolo, Gloria, FNP  carbamide peroxide (DEBROX) 6.5 % OTIC solution Place 5 drops into both ears 2 (two) times daily. 12/01/20   Tobie Suzzane MARLA, MD  Dulaglutide  (TRULICITY ) 1.5 MG/0.5ML SOAJ INJECT 1.5 MG UNDER THE SKIN ONCE A WEEK 08/22/23   Zarwolo, Gloria, FNP  FREESTYLE LITE  test strip USE TO TEST BLOOD SUGAR DAILY 08/29/23   Zarwolo, Gloria, FNP  insulin  glargine (LANTUS  SOLOSTAR) 100 UNIT/ML Solostar Pen Inject 22 Units into the skin at bedtime. 08/05/23   Zarwolo, Gloria, FNP  levothyroxine  (SYNTHROID ) 25 MCG tablet Take 1 tablet (25 mcg total) by mouth daily. 09/20/23   Zarwolo, Gloria, FNP  metFORMIN  (GLUCOPHAGE ) 1000 MG tablet Take 1 tablet (1,000 mg total) by mouth 2 (two) times daily with a meal. 10/24/22   Zarwolo, Gloria, FNP  metoprolol  succinate (TOPROL -XL) 50 MG 24 hr tablet Take 1 tablet (50 mg  total) by mouth daily. Take with or immediately following a meal. 08/05/23   Zarwolo, Gloria, FNP  ondansetron  (ZOFRAN -ODT) 8 MG disintegrating tablet 8mg  ODT q4 hours prn nausea 09/22/21   Geroldine Berg, MD  telmisartan -hydrochlorothiazide  (MICARDIS  HCT) 40-12.5 MG tablet Take 1 tablet by mouth daily. 08/05/23   Zarwolo, Gloria, FNP  VITAMIN D  PO Take by mouth daily.    [provider]    Family History Family History  Problem Relation Age of Onset   Breast cancer Mother    CAD Father        MI sometime in 22, age unknown   CAD Brother        Pt has 3 brothers that died of MIs between ages 65s-70s   CAD Brother    CAD Brother    Colon cancer Neg Hx    Ulcers Neg Hx     Social History Social History   Tobacco Use   Smoking status: Never   Smokeless tobacco: Never  Vaping Use   Vaping status: Never Used  Substance Use Topics   Alcohol use: No   Drug use: No     Allergies   Patient has no known allergies.   Review of Systems Review of Systems Per HPI  Physical Exam Triage Vital Signs ED Triage Vitals  Encounter Vitals Group     BP 10/09/23 1146 132/76     Girls Systolic BP Percentile --      Girls Diastolic BP Percentile --      Boys Systolic BP Percentile --      Boys Diastolic BP Percentile --      Pulse Rate 10/09/23 1146 78     Resp 10/09/23 1146 18     Temp 10/09/23 1146 98.1 F (36.7 C)     Temp Source 10/09/23 1146 Oral     SpO2 10/09/23 1146 95 %     Weight --      Height --      Head Circumference --      Peak Flow --      Pain Score 10/09/23 1148 9     Pain Loc --      Pain Education --      Exclude from Growth Chart --    No data found.  Updated Vital Signs BP 132/76 (BP Location: Right Arm)   Pulse 78   Temp 98.1 F (36.7 C) (Oral)   Resp 18   SpO2 95%   Visual Acuity Right Eye Distance:   Left Eye Distance:   Bilateral Distance:    Right Eye Near:   Left Eye Near:    Bilateral Near:     Physical Exam Vitals  and nursing note reviewed.  Constitutional:      General: She is not in acute distress.    Appearance: Normal appearance.  HENT:     Head: Normocephalic.  Eyes:  Extraocular Movements: Extraocular movements intact.     Conjunctiva/sclera: Conjunctivae normal.     Pupils: Pupils are equal, round, and reactive to light.  Cardiovascular:     Rate and Rhythm: Regular rhythm.     Pulses: Normal pulses.     Heart sounds: Normal heart sounds.  Pulmonary:     Effort: Pulmonary effort is normal. No respiratory distress.     Breath sounds: Normal breath sounds. No stridor. No wheezing, rhonchi or rales.  Abdominal:     General: Bowel sounds are normal.     Palpations: Abdomen is soft.     Tenderness: There is abdominal tenderness in the suprapubic area. There is left CVA tenderness.  Musculoskeletal:     Cervical back: Normal range of motion.  Skin:    General: Skin is warm and dry.  Neurological:     General: No focal deficit present.     Mental Status: She is alert and oriented to person, place, and time.  Psychiatric:        Mood and Affect: Mood normal.        Behavior: Behavior normal.      UC Treatments / Results  Labs (all labs ordered are listed, but only abnormal results are displayed) Labs Reviewed  POCT URINALYSIS DIP (MANUAL ENTRY) - Abnormal; Notable for the following components:      Result Value   Ketones, POC UA trace (5) (*)    Blood, UA trace-intact (*)    Leukocytes, UA Small (1+) (*)    All other components within normal limits  URINE CULTURE    EKG   Radiology No results found.  Procedures Procedures (including critical care time)  Medications Ordered in UC Medications - No data to display  Initial Impression / Assessment and Plan / UC Course  I have reviewed the triage vital signs and the nursing notes.  Pertinent labs & imaging results that were available during my care of the patient were reviewed by me and considered in my medical  decision making (see chart for details).  Urinalysis is positive for small leukocytes and blood.  On exam, patient with left CVA tenderness.  Will treat for UTI while urine culture is pending.  Treat with Keflex  500 mg.  Supportive care recommendations were provided discussed with the patient to include fluids, over-the-counter analgesics such as Tylenol , developing a toileting schedule, and avoiding caffeine.  Patient was given indications regarding follow-up.  Patient was in agreement with this plan of care and verbalizes understanding.  All questions were answered.  Patient stable for discharge.   Final Clinical Impressions(s) / UC Diagnoses   Final diagnoses:  UTI symptoms     Discharge Instructions      A urine culture has been ordered.  You will be contacted when the results of the culture are received.  If the results of the culture are negative, you will be advised to stop the antibiotic. You will also have access to the results via MyChart. Take medication as prescribed. Make sure you are drinking at least 5-7 8 ounce glasses of water  daily. Develop a toileting schedule that will allow you to urinate at least every 2 hours. Avoid caffeine such as tea, soda, or coffee while symptoms persist. You may take over-the-counter Tylenol  as needed for pain or discomfort. As discussed, if your urine culture results are negative and you are continuing to experience symptoms, recommend follow-up with your primary care physician for further evaluation. Follow-up as needed.  ED Prescriptions     Medication Sig Dispense Auth. Provider   cephALEXin  (KEFLEX ) 500 MG capsule Take 1 capsule (500 mg total) by mouth 4 (four) times daily. 28 capsule Leath-Warren, Etta PARAS, NP      PDMP not reviewed this encounter.   Gilmer Etta PARAS, NP 10/09/23 1216

## 2023-10-09 NOTE — ED Triage Notes (Signed)
 Pt reports urinary frequency and dysuria since this am.

## 2023-10-10 LAB — URINE CULTURE: Culture: NO GROWTH

## 2023-10-11 ENCOUNTER — Ambulatory Visit (HOSPITAL_COMMUNITY): Payer: Self-pay

## 2023-10-29 LAB — TSH+FREE T4
Free T4: 0.73 ng/dL — ABNORMAL LOW (ref 0.82–1.77)
TSH: 46.1 u[IU]/mL — ABNORMAL HIGH (ref 0.450–4.500)

## 2023-11-04 ENCOUNTER — Ambulatory Visit: Payer: Self-pay | Admitting: Family Medicine

## 2023-11-04 DIAGNOSIS — E038 Other specified hypothyroidism: Secondary | ICD-10-CM

## 2023-11-08 ENCOUNTER — Encounter: Payer: Self-pay | Admitting: Radiology

## 2023-12-09 ENCOUNTER — Ambulatory Visit: Admitting: Family Medicine

## 2023-12-14 LAB — TSH+FREE T4
Free T4: <0.1 ng/dL — ABNORMAL LOW (ref 0.82–1.77)
TSH: 76.5 u[IU]/mL — ABNORMAL HIGH (ref 0.450–4.500)

## 2023-12-18 MED ORDER — LEVOTHYROXINE SODIUM 50 MCG PO TABS: 50.0000 ug | ORAL_TABLET | Freq: Every day | ORAL | 0 refills | Status: DC

## 2023-12-25 ENCOUNTER — Other Ambulatory Visit: Payer: Self-pay

## 2023-12-25 ENCOUNTER — Telehealth: Payer: Self-pay | Admitting: Family Medicine

## 2023-12-25 DIAGNOSIS — E038 Other specified hypothyroidism: Secondary | ICD-10-CM

## 2023-12-25 MED ORDER — LEVOTHYROXINE SODIUM 25 MCG PO TABS: 25.0000 ug | ORAL_TABLET | Freq: Every day | ORAL | 1 refills | Status: DC

## 2023-12-25 NOTE — Telephone Encounter (Signed)
 Med refilled.

## 2023-12-25 NOTE — Telephone Encounter (Signed)
 Patient needs this medicine [facility]  levothyroxine  (SYNTHROID ) 50 MCG tablet [[id number]]   Pharmacy  [facility] - [place], [place] - [address], [place] [address] Phone: [phone]  Fax: [phone] DEA #: --

## 2023-12-27 ENCOUNTER — Telehealth: Payer: Self-pay | Admitting: Family Medicine

## 2023-12-27 NOTE — Telephone Encounter (Signed)
 Patient spouse came by office Stop levothyroxine   25 mcg and future will need levothyroxine  50 mcg [facility].  Does not need now but will in 3 months refill come January to to go to [facility] the levothyroxine  50mcg

## 2024-01-16 LAB — TSH+FREE T4
Free T4: 1.22 ng/dL (ref 0.82–1.77)
TSH: 49 u[IU]/mL — ABNORMAL HIGH (ref 0.450–4.500)

## 2024-01-20 ENCOUNTER — Encounter: Payer: Self-pay | Admitting: Radiology

## 2024-01-23 ENCOUNTER — Other Ambulatory Visit (HOSPITAL_COMMUNITY): Payer: Self-pay | Admitting: Family Medicine

## 2024-01-23 DIAGNOSIS — Z1231 Encounter for screening mammogram for malignant neoplasm of breast: Secondary | ICD-10-CM

## 2024-02-06 ENCOUNTER — Other Ambulatory Visit: Payer: Self-pay

## 2024-02-06 ENCOUNTER — Telehealth: Payer: Self-pay | Admitting: Family Medicine

## 2024-02-06 DIAGNOSIS — E1169 Type 2 diabetes mellitus with other specified complication: Secondary | ICD-10-CM

## 2024-02-06 MED ORDER — METFORMIN HCL 1000 MG PO TABS
1000.0000 mg | ORAL_TABLET | Freq: Two times a day (BID) | ORAL | 3 refills | Status: AC
Start: 2024-02-06 — End: ?

## 2024-02-06 NOTE — Telephone Encounter (Signed)
  Patient came into the office to get a message to Gloria Bacchus.    Need metFORMIN  500 mg refill sent to   Pharmacy: [facility] - [place], [place] - [address], [place] [address] Phone: [phone]  Fax: [phone]

## 2024-02-06 NOTE — Telephone Encounter (Signed)
 Refill sent to pharmacy, patient is on metformin  1000

## 2024-02-19 ENCOUNTER — Ambulatory Visit (HOSPITAL_COMMUNITY)
Admission: RE | Admit: 2024-02-19 | Discharge: 2024-02-19 | Disposition: A | Source: Ambulatory Visit | Attending: Family Medicine

## 2024-02-19 DIAGNOSIS — Z1231 Encounter for screening mammogram for malignant neoplasm of breast: Secondary | ICD-10-CM | POA: Insufficient documentation

## 2024-03-20 ENCOUNTER — Ambulatory Visit: Payer: Self-pay

## 2024-03-20 ENCOUNTER — Telehealth: Payer: Self-pay | Admitting: Family Medicine

## 2024-03-20 ENCOUNTER — Other Ambulatory Visit: Payer: Self-pay

## 2024-03-20 DIAGNOSIS — E038 Other specified hypothyroidism: Secondary | ICD-10-CM

## 2024-03-20 MED ORDER — LEVOTHYROXINE SODIUM 50 MCG PO TABS: 50.0000 ug | ORAL_TABLET | Freq: Every day | ORAL | 0 refills | Status: DC

## 2024-03-20 NOTE — Telephone Encounter (Signed)
 Patient spouse came into office needs refill  levothyroxine  (SYNTHROID ) 50 MCG tablet [[id number]]  [facility] phone # [phone]

## 2024-03-20 NOTE — Telephone Encounter (Signed)
"  error  "

## 2024-03-20 NOTE — Telephone Encounter (Signed)
 Sent!

## 2024-03-20 NOTE — Telephone Encounter (Signed)
 Patient saying must have a written prescription from the dr saying change to 50mcg not the 25mcg.

## 2024-03-20 NOTE — Telephone Encounter (Signed)
 FYI Only or Action Required?: FYI only for provider: appointment scheduled on 03/23/2024.  Patient was last seen in primary care on 08/05/2023 by Edman Meade PEDLAR, FNP.  Called Nurse Triage reporting Blood Sugar Problem.  Symptoms began unsure.  Interventions attempted: Dietary changes.  Symptoms are: stable.  Triage Disposition: Home Care  Patient/caregiver understands and will follow disposition?: Yes   Copied from CRM #[id number]. Topic: Clinical - Red Word Triage >> Mar 20, 2024  2:00 PM Antwanette L wrote: Red Word that prompted transfer to Nurse Triage: Signe, pt husband is calling bc the pt has not been seen by her provider since October. He reports the pt thyroid number has been 88, 25, and 50 and blood sugar is ranging between 68-70. Every time the pt eats something it tastes like sugar and she is losing weight. Reason for Disposition  [1] Blood glucose 70 mg/dL (3.9 mmol/L) or below, OR symptomatic AND [2] cause known  Answer Assessment - Initial Assessment Questions 1. SYMPTOMS: What symptoms are you concerned about?     Low blood sugar, weight loss, thyroid, fibroids Blood sugar occasionally runs around 70 2. ONSET:  When did the symptoms start?     Occasionally, unable to pinpoint date 3. BLOOD GLUCOSE: What is your blood glucose level?      Not measured 4. USUAL RANGE: What is your blood glucose level usually? (e.g., usual fasting morning value, usual evening value)     80's  6. INSULIN : Do you take insulin ? What type of insulin (s) do you use? What is the mode of delivery? (syringe, pen; injection or pump) When did you last give yourself an insulin  dose? (i.e., time or hours/minutes ago) How much did you give? (i.e., how many units)     lantus  7. DIABETES PILLS: Do you take any pills for your diabetes? If Yes, ask: What is the name of the medicine(s) that you take for high blood sugar?     metfomin 8. OTHER SYMPTOMS: Do you have any symptoms? (e.g.,  fever, frequent urination, difficulty breathing, vomiting)     denies 9. LOW BLOOD GLUCOSE TREATMENT: What have you done so far to treat the low blood glucose level?     Eat and resolves  11. ALONE: Are you alone right now or is someone with you?        Patient with husband  Protocols used: Diabetes - Low Blood Sugar-A-AH

## 2024-03-23 ENCOUNTER — Ambulatory Visit: Admitting: Nurse Practitioner

## 2024-03-25 ENCOUNTER — Ambulatory Visit: Payer: Self-pay | Admitting: Internal Medicine

## 2024-03-25 ENCOUNTER — Encounter: Payer: Self-pay | Admitting: Internal Medicine

## 2024-03-25 VITALS — BP 121/73 | HR 86 | Ht 64.0 in | Wt 114.2 lb

## 2024-03-25 DIAGNOSIS — E1169 Type 2 diabetes mellitus with other specified complication: Secondary | ICD-10-CM

## 2024-03-25 DIAGNOSIS — E782 Mixed hyperlipidemia: Secondary | ICD-10-CM | POA: Diagnosis not present

## 2024-03-25 DIAGNOSIS — Z794 Long term (current) use of insulin: Secondary | ICD-10-CM | POA: Diagnosis not present

## 2024-03-25 DIAGNOSIS — I10 Essential (primary) hypertension: Secondary | ICD-10-CM | POA: Diagnosis not present

## 2024-03-25 DIAGNOSIS — E559 Vitamin D deficiency, unspecified: Secondary | ICD-10-CM | POA: Diagnosis not present

## 2024-03-25 DIAGNOSIS — E039 Hypothyroidism, unspecified: Secondary | ICD-10-CM | POA: Diagnosis not present

## 2024-03-25 NOTE — Progress Notes (Signed)
 "  Established Patient Office Visit  Subjective:  Patient ID: Lisa Beck, female    DOB: [birth year]  Age: 84 y.o. MRN: [id number]  CC:  Chief Complaint  Patient presents with   Blood Sugar Problem    Concerns about low blood sugar, would like blood work, also to check thyroid.   Hypothyroidism   Hypertension    HPI Lisa Beck is a 84 y.o. female with past medical history of hypertension, DM, hypothyroidism, HLD and macular pucker of left eye who presents for f/u of her chronic medical conditions.  HTN: BP is well-controlled. Takes medications regularly. Patient denies headache, dizziness, chest pain, dyspnea or palpitations.   DM: She has been taking Lantus , Trulicity  and metformin  regularly. Her blood glucose has been running low in the morning, around 70s on some days, had 66 this morning. She denies any dizziness, polyuria or polyphagia.  Hypothyroidism: She takes levothyroxine  50 mcg QD regularly. Last TSH was significantly elevated, after which her dose of Levothyroxine  was increased to 50 mcg by PCP.  Denies any recent change in weight or appetite.  Denies any recent skin or hair changes.   Past Medical History:  Diagnosis Date   Arthritis    Diabetes mellitus without complication (HCC)    History of kidney stones    HOH (hard of hearing)    Hyperlipidemia    Hypertension    Hypothyroidism     Past Surgical History:  Procedure Laterality Date   COLONOSCOPY  2007   Dr. Golda: diverticulosis   COLONOSCOPY WITH PROPOFOL  N/A 04/24/2019   Dr. Shaaron: Diverticulosis in the sigmoid colon, two 5 to 9 mm polyps removed from the descending and ascending colon.  Pathology revealed both to be benign polyps with no adenomatous changes. No future surveillance colonoscopies due to age.   ESOPHAGOGASTRODUODENOSCOPY (EGD) WITH PROPOFOL  N/A 04/24/2019   Dr. Shaaron: Small hiatal hernia, one gastric polyp benign.   POLYPECTOMY  04/24/2019   Procedure: POLYPECTOMY;  Surgeon: Shaaron Lamar HERO, MD;  [facility];  Service: Endoscopy;;  ascending;   REVERSE SHOULDER ARTHROPLASTY Right 02/16/2021   Procedure: REVERSE SHOULDER ARTHROPLASTY;  Surgeon: Onesimo Oneil LABOR, MD;  Location: [facility];  Service: Orthopedics;  Laterality: Right;    Family History  Problem Relation Age of Onset   Breast cancer Mother    CAD Father        MI sometime in 70, age unknown   CAD Brother        Pt has 3 brothers that died of MIs between ages 72s-70s   CAD Brother    CAD Brother    Colon cancer Neg Hx    Ulcers Neg Hx     Social History   Socioeconomic History   Marital status: Married    Spouse name: Not on file   Number of children: Not on file   Years of education: Not on file   Highest education level: Not on file  Occupational History   Not on file  Tobacco Use   Smoking status: Never   Smokeless tobacco: Never  Vaping Use   Vaping status: Never Used  Substance and Sexual Activity   Alcohol use: No   Drug use: No   Sexual activity: Not Currently  Other Topics Concern   Not on file  Social History Narrative   Not on file   Social Drivers of Health   Tobacco Use: Low Risk (03/25/2024)   Patient History    Smoking  Tobacco Use: Never    Smokeless Tobacco Use: Never    Passive Exposure: Not on file  Financial Resource Strain: Not on file  Food Insecurity: Not on file  Transportation Needs: Not on file  Physical Activity: Not on file  Stress: Not on file  Social Connections: Not on file  Intimate Partner Violence: Not on file  Depression (PHQ2-9): Low Risk (08/05/2023)   Depression (PHQ2-9)    PHQ-2 Score: 1  Alcohol Screen: Not on file  Housing: Not on file  Utilities: Not At Risk (01/26/2022)   [facility] Utilities    Threatened with loss of utilities: No  Health Literacy: Not on file    Outpatient Medications Prior to Visit  Medication Sig Dispense Refill   amLODipine  (NORVASC ) 5 MG tablet Take 1 tablet (5 mg total) by mouth daily. 90 tablet 3    aspirin  EC 81 MG tablet Take 81 mg by mouth daily.      atorvastatin  (LIPITOR) 40 MG tablet Take 1 tablet (40 mg total) by mouth every evening. 90 tablet 3   carbamide peroxide (DEBROX) 6.5 % OTIC solution Place 5 drops into both ears 2 (two) times daily. 15 mL 0   Dulaglutide  (TRULICITY ) 1.5 MG/0.5ML SOAJ INJECT 1.5 MG UNDER THE SKIN ONCE A WEEK 6 mL 3   FREESTYLE LITE test strip USE TO TEST BLOOD SUGAR DAILY 100 strip 3   insulin  glargine (LANTUS  SOLOSTAR) 100 UNIT/ML Solostar Pen Inject 22 Units into the skin at bedtime. 45 mL 2   metFORMIN  (GLUCOPHAGE ) 1000 MG tablet Take 1 tablet (1,000 mg total) by mouth 2 (two) times daily with a meal. 180 tablet 3   metoprolol  succinate (TOPROL -XL) 50 MG 24 hr tablet Take 1 tablet (50 mg total) by mouth daily. Take with or immediately following a meal. 90 tablet 3   ondansetron  (ZOFRAN -ODT) 8 MG disintegrating tablet 8mg  ODT q4 hours prn nausea 10 tablet 0   telmisartan -hydrochlorothiazide  (MICARDIS  HCT) 40-12.5 MG tablet Take 1 tablet by mouth daily. 90 tablet 3   VITAMIN D  PO Take by mouth daily.     cephALEXin  (KEFLEX ) 500 MG capsule Take 1 capsule (500 mg total) by mouth 4 (four) times daily. 28 capsule 0   levothyroxine  (SYNTHROID ) 25 MCG tablet Take 1 tablet (25 mcg total) by mouth daily. 90 tablet 1   levothyroxine  (SYNTHROID ) 50 MCG tablet Take 1 tablet (50 mcg total) by mouth daily. 90 tablet 0   No facility-administered medications prior to visit.    No Known Allergies  ROS Review of Systems  Constitutional:  Negative for chills and fever.  HENT:  Positive for hearing loss. Negative for ear discharge, ear pain, sinus pressure, sinus pain and voice change.   Eyes:  Negative for pain and discharge.  Respiratory:  Negative for cough and shortness of breath.   Cardiovascular:  Negative for chest pain and palpitations.  Gastrointestinal:  Negative for nausea and vomiting.  Genitourinary:  Negative for dysuria and hematuria.   Musculoskeletal:  Negative for neck pain and neck stiffness.  Skin:  Negative for rash.  Neurological:  Negative for dizziness and weakness.  Psychiatric/Behavioral:  Negative for agitation and behavioral problems.       Objective:    Physical Exam Vitals reviewed.  Constitutional:      General: She is not in acute distress.    Appearance: She is not diaphoretic.  HENT:     Head: Normocephalic and atraumatic.     Nose: Nose normal.  Mouth/Throat:     Mouth: Mucous membranes are moist.  Eyes:     General: No scleral icterus.    Extraocular Movements: Extraocular movements intact.  Cardiovascular:     Rate and Rhythm: Normal rate and regular rhythm.     Heart sounds: Normal heart sounds. No murmur heard. Pulmonary:     Breath sounds: Normal breath sounds. No wheezing or rales.  Musculoskeletal:     Cervical back: Neck supple. No tenderness.     Right lower leg: No edema.     Left lower leg: No edema.  Skin:    General: Skin is warm.     Findings: No rash.  Neurological:     General: No focal deficit present.     Mental Status: She is alert and oriented to person, place, and time.     Sensory: No sensory deficit.     Motor: No weakness.  Psychiatric:        Mood and Affect: Mood normal.        Behavior: Behavior normal.     BP 121/73   Pulse 86   Ht 5' 4 (1.626 m)   Wt 114 lb 3.2 oz (51.8 kg)   SpO2 96%   BMI 19.60 kg/m  Wt Readings from Last 3 Encounters:  03/25/24 114 lb 3.2 oz (51.8 kg)  08/05/23 117 lb (53.1 kg)  02/21/23 120 lb 1.3 oz (54.5 kg)    Lab Results  Component Value Date   TSH 25.400 (H) 03/25/2024   Lab Results  Component Value Date   WBC 9.1 03/25/2024   HGB 12.4 03/25/2024   HCT 36.6 03/25/2024   MCV 87 03/25/2024   PLT 350 03/25/2024   Lab Results  Component Value Date   NA 135 03/25/2024   K 4.4 03/25/2024   CO2 22 03/25/2024   GLUCOSE 88 03/25/2024   BUN 13 03/25/2024   CREATININE 0.72 03/25/2024   BILITOT 0.8  03/25/2024   ALKPHOS 90 03/25/2024   AST 24 03/25/2024   ALT 19 03/25/2024   PROT 6.9 03/25/2024   ALBUMIN 4.4 03/25/2024   CALCIUM  10.5 (H) 03/25/2024   ANIONGAP 8 09/22/2021   EGFR 82 03/25/2024   Lab Results  Component Value Date   CHOL 132 03/25/2024   Lab Results  Component Value Date   HDL 79 03/25/2024   Lab Results  Component Value Date   LDLCALC 39 03/25/2024   Lab Results  Component Value Date   TRIG 66 03/25/2024   Lab Results  Component Value Date   CHOLHDL 1.7 03/25/2024   Lab Results  Component Value Date   HGBA1C 6.5 (H) 03/25/2024      Assessment & Plan:   Problem List Items Addressed This Visit       Cardiovascular and Mediastinum   HTN (hypertension)   BP Readings from Last 1 Encounters:  03/25/24 121/73   Well-controlled with Amlodipine , Micardis -HCT and Metoprolol  Counseled for compliance with the medications Advised DASH diet and moderate exercise/walking as tolerated      Relevant Orders   CMP14+EGFR (Completed)   CBC with Differential/Platelet (Completed)     Endocrine   Type 2 diabetes mellitus with other specified complication (HCC) - Primary   With HTN  Lab Results  Component Value Date   HGBA1C 6.5 (H) 03/25/2024    On Lantus  22 U qHS, Trulicity  0.75 mg qw and Metformin  500 mg BID - decreased Lantus  to 18 U qHS due to episodes of borderline hypoglycemia  in the morning Advised to follow diabetic diet On ARB and statin F/u HbA1C, CMP and lipid panel Diabetic eye exam: Advised to follow up with Ophthalmology for diabetic eye exam      Relevant Orders   Microalbumin / creatinine urine ratio   Hemoglobin A1c (Completed)   CMP14+EGFR (Completed)   Hypothyroidism   Lab Results  Component Value Date   TSH 25.400 (H) 03/25/2024   On Levothyroxine  50 mcg QD now, increased dose to 75 mcg QD after checking TSH and free T4  Check TSH and free T4 after 3 months      Relevant Orders   TSH + free T4 (Completed)      Other   HLD (hyperlipidemia)   On statin Check lipid panel      Relevant Orders   Lipid panel (Completed)   Other Visit Diagnoses       Vitamin D  deficiency       Relevant Orders   VITAMIN D  25 Hydroxy (Vit-D Deficiency, Fractures) (Completed)       No orders of the defined types were placed in this encounter.   Follow-up: Return in about 4 months (around 07/23/2024) for DM and hypothyroidism.    Suzzane MARLA Blanch, MD "

## 2024-03-25 NOTE — Patient Instructions (Signed)
 Please start taking Lantus  18 Units instead of 22 Units. Continue taking Trulicity  and Metformin .  Please continue to take medications as prescribed.  Please continue to follow low carb diet and ambulate as tolerated.

## 2024-03-26 ENCOUNTER — Ambulatory Visit: Payer: Self-pay | Admitting: Internal Medicine

## 2024-03-26 ENCOUNTER — Telehealth: Payer: Self-pay

## 2024-03-26 ENCOUNTER — Other Ambulatory Visit: Payer: Self-pay | Admitting: Internal Medicine

## 2024-03-26 DIAGNOSIS — E038 Other specified hypothyroidism: Secondary | ICD-10-CM

## 2024-03-26 LAB — CBC WITH DIFFERENTIAL/PLATELET
Basophils Absolute: 0.1 x10E3/uL (ref 0.0–0.2)
Basos: 1 %
EOS (ABSOLUTE): 0.2 x10E3/uL (ref 0.0–0.4)
Eos: 2 %
Hematocrit: 36.6 % (ref 34.0–46.6)
Hemoglobin: 12.4 g/dL (ref 11.1–15.9)
Immature Grans (Abs): 0 x10E3/uL (ref 0.0–0.1)
Immature Granulocytes: 0 %
Lymphocytes Absolute: 2.4 x10E3/uL (ref 0.7–3.1)
Lymphs: 26 %
MCH: 29.5 pg (ref 26.6–33.0)
MCHC: 33.9 g/dL (ref 31.5–35.7)
MCV: 87 fL (ref 79–97)
Monocytes Absolute: 0.8 x10E3/uL (ref 0.1–0.9)
Monocytes: 9 %
Neutrophils Absolute: 5.6 x10E3/uL (ref 1.4–7.0)
Neutrophils: 62 %
Platelets: 350 x10E3/uL (ref 150–450)
RBC: 4.21 x10E6/uL (ref 3.77–5.28)
RDW: 12.5 % (ref 11.7–15.4)
WBC: 9.1 x10E3/uL (ref 3.4–10.8)

## 2024-03-26 LAB — CMP14+EGFR
ALT: 19 IU/L (ref 0–32)
AST: 24 IU/L (ref 0–40)
Albumin: 4.4 g/dL (ref 3.7–4.7)
Alkaline Phosphatase: 90 IU/L (ref 48–129)
BUN/Creatinine Ratio: 18 (ref 12–28)
BUN: 13 mg/dL (ref 8–27)
Bilirubin Total: 0.8 mg/dL (ref 0.0–1.2)
CO2: 22 mmol/L (ref 20–29)
Calcium: 10.5 mg/dL — ABNORMAL HIGH (ref 8.7–10.3)
Chloride: 96 mmol/L (ref 96–106)
Creatinine, Ser: 0.72 mg/dL (ref 0.57–1.00)
Globulin, Total: 2.5 g/dL (ref 1.5–4.5)
Glucose: 88 mg/dL (ref 70–99)
Potassium: 4.4 mmol/L (ref 3.5–5.2)
Sodium: 135 mmol/L (ref 134–144)
Total Protein: 6.9 g/dL (ref 6.0–8.5)
eGFR: 82 mL/min/1.73

## 2024-03-26 LAB — LIPID PANEL
Chol/HDL Ratio: 1.7 ratio (ref 0.0–4.4)
Cholesterol, Total: 132 mg/dL (ref 100–199)
HDL: 79 mg/dL
LDL Chol Calc (NIH): 39 mg/dL (ref 0–99)
Triglycerides: 66 mg/dL (ref 0–149)
VLDL Cholesterol Cal: 14 mg/dL (ref 5–40)

## 2024-03-26 LAB — HEMOGLOBIN A1C
Est. average glucose Bld gHb Est-mCnc: 140 mg/dL
Hgb A1c MFr Bld: 6.5 % — ABNORMAL HIGH (ref 4.8–5.6)

## 2024-03-26 LAB — VITAMIN D 25 HYDROXY (VIT D DEFICIENCY, FRACTURES): Vit D, 25-Hydroxy: 54.1 ng/mL (ref 30.0–100.0)

## 2024-03-26 LAB — TSH+FREE T4
Free T4: 1.38 ng/dL (ref 0.82–1.77)
TSH: 25.4 u[IU]/mL — ABNORMAL HIGH (ref 0.450–4.500)

## 2024-03-26 MED ORDER — LEVOTHYROXINE SODIUM 75 MCG PO TABS: 75.0000 ug | ORAL_TABLET | Freq: Every day | ORAL | 1 refills | Status: AC

## 2024-03-26 NOTE — Assessment & Plan Note (Signed)
On statin Check lipid panel 

## 2024-03-26 NOTE — Assessment & Plan Note (Signed)
 BP Readings from Last 1 Encounters:  03/25/24 121/73   Well-controlled with Amlodipine , Micardis -HCT and Metoprolol  Counseled for compliance with the medications Advised DASH diet and moderate exercise/walking as tolerated

## 2024-03-26 NOTE — Telephone Encounter (Signed)
 Called pt back went over labs.

## 2024-03-26 NOTE — Assessment & Plan Note (Signed)
 Lab Results  Component Value Date   TSH 25.400 (H) 03/25/2024   On Levothyroxine  50 mcg QD now, increased dose to 75 mcg QD after checking TSH and free T4  Check TSH and free T4 after 3 months

## 2024-03-26 NOTE — Assessment & Plan Note (Signed)
 With HTN  Lab Results  Component Value Date   HGBA1C 6.5 (H) 03/25/2024    On Lantus  22 U qHS, Trulicity  0.75 mg qw and Metformin  500 mg BID - decreased Lantus  to 18 U qHS due to episodes of borderline hypoglycemia in the morning Advised to follow diabetic diet On ARB and statin F/u HbA1C, CMP and lipid panel Diabetic eye exam: Advised to follow up with Ophthalmology for diabetic eye exam

## 2024-03-26 NOTE — Telephone Encounter (Signed)
 Copied from CRM [id number]. Topic: Clinical - Lab/Test Results >> Mar 26, 2024  9:48 AM Carlyon D wrote: Reason for CRM: Pt is calling in regards to receiving a missed call in regards to her lab results.  Called [facility] Miss powell  is busy with patients. Please call pt back when available

## 2024-03-27 LAB — MICROALBUMIN / CREATININE URINE RATIO
Creatinine, Urine: 126.1 mg/dL
Microalb/Creat Ratio: 6 mg/g{creat} (ref 0–29)
Microalbumin, Urine: 7 ug/mL

## 2024-04-08 ENCOUNTER — Ambulatory Visit (INDEPENDENT_AMBULATORY_CARE_PROVIDER_SITE_OTHER): Payer: Self-pay

## 2024-04-08 VITALS — BP 116/66 | HR 73 | Resp 14 | Ht 64.0 in | Wt 116.0 lb

## 2024-04-08 DIAGNOSIS — Z0000 Encounter for general adult medical examination without abnormal findings: Secondary | ICD-10-CM

## 2024-04-08 NOTE — Progress Notes (Signed)
 "  Chief Complaint  Patient presents with   Medicare Wellness     Subjective:   Lisa Beck is a 84 y.o. female who presents for a Medicare Annual Wellness Visit.  Visit info / Clinical Intake: Medicare Wellness Visit Type:: Subsequent Annual Wellness Visit Persons participating in visit and providing information:: patient Medicare Wellness Visit Mode:: In-person (required for WTM) Interpreter Needed?: No Pre-visit prep was completed: yes AWV questionnaire completed by patient prior to visit?: no Living arrangements:: lives with spouse/significant other Patient's Overall Health Status Rating: good Typical amount of pain: none Does pain affect daily life?: no Are you currently prescribed opioids?: no  Dietary Habits and Nutritional Risks How many meals a day?: 3 Eats fruit and vegetables daily?: yes Most meals are obtained by: preparing own meals In the last 2 weeks, have you had any of the following?: none Diabetic:: (!) yes Any non-healing wounds?: no How often do you check your BS?: 1 Would you like to be referred to a Nutritionist or for Diabetic Management? : no  Functional Status Activities of Daily Living (to include ambulation/medication): Independent Ambulation: Independent Medication Administration: Independent Home Management (perform basic housework or laundry): Independent Manage your own finances?: yes Primary transportation is: driving Concerns about vision?: no *vision screening is required for WTM* Concerns about hearing?: no  Fall Screening Falls in the past year?: 0 Number of falls in past year: 0 Was there an injury with Fall?: 0 Fall Risk Category Calculator: 0 Patient Fall Risk Level: Low Fall Risk  Fall Risk Patient at Risk for Falls Due to: No Fall Risks Fall risk Follow up: Falls evaluation completed; Education provided; Falls prevention discussed  Home and Transportation Safety: All rugs have non-skid backing?: yes All stairs or  steps have railings?: yes Grab bars in the bathtub or shower?: yes Have non-skid surface in bathtub or shower?: yes Good home lighting?: yes Hospital stays in the last year:: no  Cognitive Assessment Difficulty concentrating, remembering, or making decisions? : no Will 6CIT or Mini Cog be Completed: yes What year is it?: 0 points What month is it?: 0 points Give patient an address phrase to remember (5 components): [address] [place] About what time is it?: 0 points Count backwards from 20 to 1: 0 points Say the months of the year in reverse: 0 points Repeat the address phrase from earlier: 0 points 6 CIT Score: 0 points  Advance Directives (For Healthcare) Does Patient Have a Medical Advance Directive?: Yes Type of Advance Directive: Healthcare Power of [place]; Living will Copy of Healthcare Power of Attorney in Chart?: No - copy requested Copy of Living Will in Chart?: No - copy requested  Reviewed/Updated  Reviewed/Updated: Reviewed All (Medical, Surgical, Family, Medications, Allergies, Care Teams, Patient Goals)    Allergies (verified) Patient has no known allergies.   Current Medications (verified) Outpatient Encounter Medications as of 04/08/2024  Medication Sig   amLODipine  (NORVASC ) 5 MG tablet Take 1 tablet (5 mg total) by mouth daily.   aspirin  EC 81 MG tablet Take 81 mg by mouth daily.    atorvastatin  (LIPITOR) 40 MG tablet Take 1 tablet (40 mg total) by mouth every evening.   carbamide peroxide (DEBROX) 6.5 % OTIC solution Place 5 drops into both ears 2 (two) times daily.   Dulaglutide  (TRULICITY ) 1.5 MG/0.5ML SOAJ INJECT 1.5 MG UNDER THE SKIN ONCE A WEEK   FREESTYLE LITE test strip USE TO TEST BLOOD SUGAR DAILY   insulin  glargine (LANTUS  SOLOSTAR)  100 UNIT/ML Solostar Pen Inject 22 Units into the skin at bedtime.   levothyroxine  (SYNTHROID ) 75 MCG tablet Take 1 tablet (75 mcg total) by mouth daily.   metFORMIN  (GLUCOPHAGE ) 1000 MG tablet Take 1 tablet  (1,000 mg total) by mouth 2 (two) times daily with a meal.   metoprolol  succinate (TOPROL -XL) 50 MG 24 hr tablet Take 1 tablet (50 mg total) by mouth daily. Take with or immediately following a meal.   ondansetron  (ZOFRAN -ODT) 8 MG disintegrating tablet 8mg  ODT q4 hours prn nausea   telmisartan -hydrochlorothiazide  (MICARDIS  HCT) 40-12.5 MG tablet Take 1 tablet by mouth daily.   VITAMIN D  PO Take by mouth daily.   No facility-administered encounter medications on file as of 04/08/2024.    History: Past Medical History:  Diagnosis Date   Arthritis    Diabetes mellitus without complication (HCC)    History of kidney stones    HOH (hard of hearing)    Hyperlipidemia    Hypertension    Hypothyroidism    Past Surgical History:  Procedure Laterality Date   COLONOSCOPY  2007   Dr. Golda: diverticulosis   COLONOSCOPY WITH PROPOFOL  N/A 04/24/2019   Dr. Shaaron: Diverticulosis in the sigmoid colon, two 5 to 9 mm polyps removed from the descending and ascending colon.  Pathology revealed both to be benign polyps with no adenomatous changes. No future surveillance colonoscopies due to age.   ESOPHAGOGASTRODUODENOSCOPY (EGD) WITH PROPOFOL  N/A 04/24/2019   Dr. Shaaron: Small hiatal hernia, one gastric polyp benign.   POLYPECTOMY  04/24/2019   Procedure: POLYPECTOMY;  Surgeon: Shaaron Lamar HERO, MD;  [facility];  Service: Endoscopy;;  ascending;   REVERSE SHOULDER ARTHROPLASTY Right 02/16/2021   Procedure: REVERSE SHOULDER ARTHROPLASTY;  Surgeon: Onesimo Oneil LABOR, MD;  Location: [facility];  Service: Orthopedics;  Laterality: Right;   Family History  Problem Relation Age of Onset   Breast cancer Mother    CAD Father        MI sometime in 68, age unknown   CAD Brother        Pt has 3 brothers that died of MIs between ages 24s-70s   CAD Brother    CAD Brother    Colon cancer Neg Hx    Ulcers Neg Hx    Social History   Occupational History   Not on file  Tobacco Use   Smoking  status: Never   Smokeless tobacco: Never  Vaping Use   Vaping status: Never Used  Substance and Sexual Activity   Alcohol use: No   Drug use: No   Sexual activity: Not Currently   Tobacco Counseling Counseling given: Yes  SDOH Screenings   Food Insecurity: No Food Insecurity (04/08/2024)  Housing: Low Risk (04/08/2024)  Transportation Needs: No Transportation Needs (04/08/2024)  Utilities: Not At Risk (04/08/2024)  Depression (PHQ2-9): Low Risk (04/08/2024)  Physical Activity: Sufficiently Active (04/08/2024)  Social Connections: Moderately Isolated (04/08/2024)  Stress: No Stress Concern Present (04/08/2024)  Tobacco Use: Low Risk (04/08/2024)  Health Literacy: Adequate Health Literacy (04/08/2024)   See flowsheets for full screening details  Depression Screen PHQ 2 & 9 Depression Scale- Over the past 2 weeks, how often have you been bothered by any of the following problems? Little interest or pleasure in doing things: 0 Feeling down, depressed, or hopeless (PHQ Adolescent also includes...irritable): 0 PHQ-2 Total Score: 0 Trouble falling or staying asleep, or sleeping too much: 0 Feeling tired or having little energy: 0 Poor appetite or overeating (PHQ  Adolescent also includes...weight loss): 0 Feeling bad about yourself - or that you are a failure or have let yourself or your family down: 0 Trouble concentrating on things, such as reading the newspaper or watching television (PHQ Adolescent also includes...like school work): 0 Moving or speaking so slowly that other people could have noticed. Or the opposite - being so fidgety or restless that you have been moving around a lot more than usual: 0 Thoughts that you would be better off dead, or of hurting yourself in some way: 0 PHQ-9 Total Score: 0  Depression Treatment Depression Interventions/Treatment : PHQ2-9 Score <4 Follow-up Not Indicated     Goals Addressed               This Visit's Progress     Stay as healthy  and active as I can (pt-stated)               Objective:    Today's Vitals   04/08/24 0914  BP: 116/66  Pulse: 73  Resp: 14  SpO2: 100%  Weight: 116 lb (52.6 kg)  Height: 5' 4 (1.626 m)   Body mass index is 19.91 kg/m.  Hearing/Vision screen Hearing Screening - Comments:: Patient denies any hearing difficulties.   Vision Screening - Comments:: Wears rx glasses - up to date with routine eye exams with  My [facility] in [place]/ Dr. Willma Moats Immunizations and Health Maintenance Health Maintenance  Topic Date Due   DTaP/Tdap/Td (1 - Tdap) Never done   Zoster Vaccines- Shingrix (1 of 2) Never done   Medicare Annual Wellness (AWV)  01/30/2023   OPHTHALMOLOGY EXAM  08/29/2023   COVID-19 Vaccine (7 - 2025-26 season) 06/14/2024   HEMOGLOBIN A1C  09/22/2024   Diabetic kidney evaluation - eGFR measurement  03/25/2025   Diabetic kidney evaluation - Urine ACR  03/25/2025   FOOT EXAM  03/25/2025   Pneumococcal Vaccine: 50+ Years  Completed   Influenza Vaccine  Completed   Bone Density Scan  Completed   Meningococcal B Vaccine  Aged Out        Assessment/Plan:  This is a routine wellness examination for [place].  Patient Care Team: Bacchus, Meade PEDLAR, FNP as PCP - General (Family Medicine) Shaaron Lamar HERO, MD as Consulting Physician (Gastroenterology) Dr [facility], OD (Optometry) [facility] (Optometry) Rankin, Arley LABOR, MD as Consulting Physician (Ophthalmology)  I have personally reviewed and noted the following in the patients chart:   Medical and social history Use of alcohol, tobacco or illicit drugs  Current medications and supplements including opioid prescriptions. Functional ability and status Nutritional status Physical activity Advanced directives List of other physicians Hospitalizations, surgeries, and ER visits in previous 12 months Vitals Screenings to include cognitive, depression, and  falls Referrals and appointments  No orders of the defined types were placed in this encounter.  In addition, I have reviewed and discussed with patient certain preventive protocols, quality metrics, and best practice recommendations. A written personalized care plan for preventive services as well as general preventive health recommendations were provided to patient.   Makari Sanko, CMA   04/08/2024   Return April 12, 2025 at 8:40 am, for In office Medicare Well Visit w  Wellness Nurse.  After Visit Summary: (In Person-Declined) Patient declined AVS at this time.   "

## 2024-04-08 NOTE — Patient Instructions (Signed)
 Lisa Beck,  Thank you for taking the time for your Medicare Wellness Visit. I appreciate your continued commitment to your health goals. Please review the care plan we discussed, and feel free to reach out if I can assist you further.  Please note that Annual Wellness Visits do not include a physical exam. Some assessments may be limited, especially if the visit was conducted virtually. If needed, we may recommend an in-person follow-up with your provider.  Ongoing Care Seeing your primary care provider every 3 to 6 months helps us  monitor your health and provide consistent, personalized care.   Referrals If a referral was made during today's visit and you haven't received any updates within two weeks, please contact the referred provider directly to check on the status.  Recommended Screenings:  Health Maintenance  Topic Date Due   DTaP/Tdap/Td vaccine (1 - Tdap) Never done   Zoster (Shingles) Vaccine (1 of 2) Never done   Medicare Annual Wellness Visit  01/30/2023   Eye exam for diabetics  08/29/2023   COVID-19 Vaccine (7 - 2025-26 season) 06/14/2024   Hemoglobin A1C  09/22/2024   Yearly kidney function blood test for diabetes  03/25/2025   Kidney health urinalysis for diabetes  03/25/2025   Complete foot exam   03/25/2025   Pneumococcal Vaccine for age over 36  Completed   Flu Shot  Completed   Osteoporosis screening with Bone Density Scan  Completed   Meningitis B Vaccine  Aged Out       04/08/2024    9:21 AM  Advanced Directives  Does Patient Have a Medical Advance Directive? Yes  Type of Estate Agent of [place];Living will  Copy of Healthcare Power of Attorney in Chart? No - copy requested    Vision: Annual vision screenings are recommended for early detection of glaucoma, cataracts, and diabetic retinopathy. These exams can also reveal signs of chronic conditions such as diabetes and high blood pressure.  Dental: Annual dental screenings help  detect early signs of oral cancer, gum disease, and other conditions linked to overall health, including heart disease and diabetes.  Please see the attached documents for additional preventive care recommendations.

## 2024-04-13 ENCOUNTER — Ambulatory Visit: Admitting: Family Medicine

## 2024-05-20 ENCOUNTER — Ambulatory Visit: Payer: Self-pay | Admitting: Nurse Practitioner

## 2024-07-29 ENCOUNTER — Ambulatory Visit: Payer: Self-pay | Admitting: Internal Medicine

## 2025-04-12 ENCOUNTER — Ambulatory Visit: Payer: Self-pay
